# Patient Record
Sex: Male | Born: 1965 | Race: White | Hispanic: No | Smoking: Current every day smoker
Health system: Southern US, Community
[De-identification: ages and names within clinical notes are randomized; demographics above are authoritative.]

## PROBLEM LIST (undated history)

## (undated) DIAGNOSIS — G8929 Other chronic pain: Secondary | ICD-10-CM

## (undated) DIAGNOSIS — K219 Gastro-esophageal reflux disease without esophagitis: Secondary | ICD-10-CM

## (undated) DIAGNOSIS — M549 Dorsalgia, unspecified: Secondary | ICD-10-CM

## (undated) DIAGNOSIS — T7840XA Allergy, unspecified, initial encounter: Secondary | ICD-10-CM

## (undated) DIAGNOSIS — I10 Essential (primary) hypertension: Secondary | ICD-10-CM

## (undated) DIAGNOSIS — F419 Anxiety disorder, unspecified: Secondary | ICD-10-CM

## (undated) DIAGNOSIS — R339 Retention of urine, unspecified: Secondary | ICD-10-CM

## (undated) DIAGNOSIS — F1110 Opioid abuse, uncomplicated: Secondary | ICD-10-CM

## (undated) HISTORY — PX: KNEE SURGERY: SHX244

## (undated) HISTORY — DX: Gastro-esophageal reflux disease without esophagitis: K21.9

## (undated) HISTORY — DX: Allergy, unspecified, initial encounter: T78.40XA

## (undated) HISTORY — DX: Dorsalgia, unspecified: M54.9

## (undated) HISTORY — PX: BACK SURGERY: SHX140

---

## 2006-01-13 ENCOUNTER — Inpatient Hospital Stay: Payer: Self-pay | Admitting: Psychiatry

## 2006-03-25 ENCOUNTER — Other Ambulatory Visit: Payer: Self-pay

## 2006-03-25 ENCOUNTER — Emergency Department: Payer: Self-pay | Admitting: Emergency Medicine

## 2006-10-24 ENCOUNTER — Other Ambulatory Visit: Payer: Self-pay

## 2006-10-24 ENCOUNTER — Emergency Department: Payer: Self-pay | Admitting: Emergency Medicine

## 2006-12-15 ENCOUNTER — Emergency Department: Payer: Self-pay | Admitting: Unknown Physician Specialty

## 2008-07-25 ENCOUNTER — Ambulatory Visit: Payer: Self-pay | Admitting: Pain Medicine

## 2008-12-20 ENCOUNTER — Ambulatory Visit: Payer: Self-pay | Admitting: Orthopedic Surgery

## 2009-01-07 ENCOUNTER — Ambulatory Visit: Payer: Self-pay | Admitting: Orthopedic Surgery

## 2009-01-14 ENCOUNTER — Ambulatory Visit: Payer: Self-pay | Admitting: Orthopedic Surgery

## 2009-02-15 ENCOUNTER — Ambulatory Visit: Payer: Self-pay | Admitting: Internal Medicine

## 2009-02-20 ENCOUNTER — Ambulatory Visit: Payer: Self-pay | Admitting: Internal Medicine

## 2009-02-26 ENCOUNTER — Ambulatory Visit: Payer: Self-pay | Admitting: Internal Medicine

## 2009-02-27 ENCOUNTER — Ambulatory Visit: Payer: Self-pay | Admitting: Family Medicine

## 2009-03-28 ENCOUNTER — Ambulatory Visit: Payer: Self-pay | Admitting: Cardiovascular Disease

## 2009-04-22 ENCOUNTER — Ambulatory Visit: Payer: Self-pay | Admitting: Internal Medicine

## 2009-05-16 ENCOUNTER — Ambulatory Visit: Payer: Self-pay | Admitting: Unknown Physician Specialty

## 2009-05-20 ENCOUNTER — Ambulatory Visit: Payer: Self-pay | Admitting: Unknown Physician Specialty

## 2009-05-20 HISTORY — PX: OTHER SURGICAL HISTORY: SHX169

## 2009-07-31 ENCOUNTER — Emergency Department: Payer: Self-pay | Admitting: Emergency Medicine

## 2010-06-25 ENCOUNTER — Ambulatory Visit: Payer: Self-pay | Admitting: Family Medicine

## 2011-06-23 ENCOUNTER — Inpatient Hospital Stay: Payer: Self-pay | Admitting: Psychiatry

## 2011-06-23 LAB — DRUG SCREEN, URINE
Amphetamines, Ur Screen: NEGATIVE (ref ?–1000)
Barbiturates, Ur Screen: NEGATIVE (ref ?–200)
Benzodiazepine, Ur Scrn: NEGATIVE (ref ?–200)
Cocaine Metabolite,Ur ~~LOC~~: POSITIVE (ref ?–300)
MDMA (Ecstasy)Ur Screen: NEGATIVE (ref ?–500)
Methadone, Ur Screen: POSITIVE (ref ?–300)
Opiate, Ur Screen: NEGATIVE (ref ?–300)

## 2011-06-23 LAB — LIPASE, BLOOD: Lipase: 72 U/L — ABNORMAL LOW (ref 73–393)

## 2011-06-23 LAB — URINALYSIS, COMPLETE
Bacteria: NONE SEEN
Blood: NEGATIVE
Glucose,UR: NEGATIVE mg/dL (ref 0–75)
Ketone: NEGATIVE
Ph: 6 (ref 4.5–8.0)
Protein: NEGATIVE
Specific Gravity: 1.004 (ref 1.003–1.030)

## 2011-06-23 LAB — CBC
MCH: 31.3 pg (ref 26.0–34.0)
MCHC: 34 g/dL (ref 32.0–36.0)
MCV: 92 fL (ref 80–100)
RBC: 4.4 10*6/uL (ref 4.40–5.90)
RDW: 14.4 % (ref 11.5–14.5)
WBC: 8.1 10*3/uL (ref 3.8–10.6)

## 2011-06-23 LAB — COMPREHENSIVE METABOLIC PANEL
Alkaline Phosphatase: 73 U/L (ref 50–136)
BUN: 8 mg/dL (ref 7–18)
Bilirubin,Total: 0.5 mg/dL (ref 0.2–1.0)
Calcium, Total: 8.9 mg/dL (ref 8.5–10.1)
Creatinine: 0.88 mg/dL (ref 0.60–1.30)
EGFR (African American): >60
Glucose: 77 mg/dL (ref 65–99)
SGOT(AST): 17 U/L (ref 15–37)
SGPT (ALT): 15 U/L
Total Protein: 7.5 g/dL (ref 6.4–8.2)

## 2011-06-23 LAB — ETHANOL
Ethanol %: <0.003 % (ref 0.000–0.080)
Ethanol: <3 mg/dL

## 2011-06-23 LAB — ACETAMINOPHEN LEVEL: Acetaminophen: <2 ug/mL

## 2011-06-24 LAB — BEHAVIORAL MEDICINE 1 PANEL
Albumin: 3.8 g/dL (ref 3.4–5.0)
Alkaline Phosphatase: 73 U/L (ref 50–136)
Anion Gap: 8 (ref 7–16)
BUN: 10 mg/dL (ref 7–18)
Basophil %: 0.4 %
Bilirubin,Total: 0.4 mg/dL (ref 0.2–1.0)
Chloride: 105 mmol/L (ref 98–107)
Co2: 28 mmol/L (ref 21–32)
Creatinine: 0.97 mg/dL (ref 0.60–1.30)
EGFR (African American): >60
EGFR (Non-African Amer.): >60
Eosinophil #: 0.2 10*3/uL (ref 0.0–0.7)
Eosinophil %: 2.3 %
Glucose: 100 mg/dL — ABNORMAL HIGH (ref 65–99)
HGB: 14.3 g/dL (ref 13.0–18.0)
Lymphocyte #: 2.3 10*3/uL (ref 1.0–3.6)
Lymphocyte %: 32.1 %
MCH: 31 pg (ref 26.0–34.0)
MCV: 93 fL (ref 80–100)
Monocyte #: 0.5 x10 3/mm (ref 0.2–1.0)
Neutrophil %: 58.3 %
Osmolality: 280 (ref 275–301)
Platelet: 252 10*3/uL (ref 150–440)
RBC: 4.62 10*6/uL (ref 4.40–5.90)
RDW: 14.5 % (ref 11.5–14.5)
SGPT (ALT): 14 U/L
Thyroid Stimulating Horm: 1.91 u[IU]/mL
WBC: 7.2 10*3/uL (ref 3.8–10.6)

## 2011-06-27 ENCOUNTER — Emergency Department: Payer: Self-pay | Admitting: Emergency Medicine

## 2011-06-27 LAB — COMPREHENSIVE METABOLIC PANEL
Anion Gap: 11 (ref 7–16)
Bilirubin,Total: 0.7 mg/dL (ref 0.2–1.0)
Co2: 24 mmol/L (ref 21–32)
Creatinine: 0.85 mg/dL (ref 0.60–1.30)
EGFR (Non-African Amer.): >60
Osmolality: 274 (ref 275–301)
SGPT (ALT): 15 U/L
Sodium: 138 mmol/L (ref 136–145)
Total Protein: 7.8 g/dL (ref 6.4–8.2)

## 2011-06-27 LAB — CBC
MCH: 31.9 pg (ref 26.0–34.0)
MCHC: 34.7 g/dL (ref 32.0–36.0)
Platelet: 253 10*3/uL (ref 150–440)
WBC: 6.7 10*3/uL (ref 3.8–10.6)

## 2011-06-27 LAB — DRUG SCREEN, URINE
Amphetamines, Ur Screen: NEGATIVE (ref ?–1000)
Barbiturates, Ur Screen: NEGATIVE (ref ?–200)
Benzodiazepine, Ur Scrn: NEGATIVE (ref ?–200)
MDMA (Ecstasy)Ur Screen: NEGATIVE (ref ?–500)
Methadone, Ur Screen: NEGATIVE (ref ?–300)

## 2011-06-27 LAB — SALICYLATE LEVEL: Salicylates, Serum: 2.5 mg/dL

## 2011-06-27 LAB — ETHANOL: Ethanol: <3 mg/dL

## 2011-08-14 ENCOUNTER — Emergency Department: Payer: Self-pay | Admitting: Emergency Medicine

## 2011-08-14 LAB — URINALYSIS, COMPLETE
Bilirubin,UR: NEGATIVE
Glucose,UR: NEGATIVE mg/dL (ref 0–75)
Ketone: NEGATIVE
Leukocyte Esterase: NEGATIVE
Ph: 6 (ref 4.5–8.0)
Protein: NEGATIVE
RBC,UR: <1 /HPF (ref 0–5)
Specific Gravity: 1.002 (ref 1.003–1.030)
Squamous Epithelial: NONE SEEN
WBC UR: NONE SEEN /HPF (ref 0–5)

## 2011-08-14 LAB — TROPONIN I: Troponin-I: <0.02 ng/mL

## 2011-08-14 LAB — CBC
HCT: 39 % — ABNORMAL LOW (ref 40.0–52.0)
HGB: 12.9 g/dL — ABNORMAL LOW (ref 13.0–18.0)
MCH: 31.7 pg (ref 26.0–34.0)
MCV: 96 fL (ref 80–100)
RBC: 4.06 10*6/uL — ABNORMAL LOW (ref 4.40–5.90)
RDW: 14.1 % (ref 11.5–14.5)
WBC: 5.7 10*3/uL (ref 3.8–10.6)

## 2011-08-14 LAB — CK TOTAL AND CKMB (NOT AT ARMC)
CK, Total: 68 U/L (ref 35–232)
CK-MB: <0.5 ng/mL — ABNORMAL LOW (ref 0.5–3.6)

## 2011-08-14 LAB — COMPREHENSIVE METABOLIC PANEL
Albumin: 3.2 g/dL — ABNORMAL LOW (ref 3.4–5.0)
Alkaline Phosphatase: 84 U/L (ref 50–136)
Bilirubin,Total: 0.3 mg/dL (ref 0.2–1.0)
Calcium, Total: 8.3 mg/dL — ABNORMAL LOW (ref 8.5–10.1)
Chloride: 109 mmol/L — ABNORMAL HIGH (ref 98–107)
Creatinine: 0.83 mg/dL (ref 0.60–1.30)
EGFR (African American): >60
EGFR (Non-African Amer.): >60
Osmolality: 289 (ref 275–301)
Potassium: 3.3 mmol/L — ABNORMAL LOW (ref 3.5–5.1)
SGPT (ALT): 15 U/L
Sodium: 146 mmol/L — ABNORMAL HIGH (ref 136–145)

## 2011-08-15 ENCOUNTER — Emergency Department: Payer: Self-pay | Admitting: Emergency Medicine

## 2011-09-13 ENCOUNTER — Emergency Department: Payer: Self-pay | Admitting: *Deleted

## 2011-11-28 ENCOUNTER — Ambulatory Visit: Payer: Self-pay

## 2011-11-28 ENCOUNTER — Inpatient Hospital Stay: Payer: Self-pay | Admitting: Psychiatry

## 2011-11-28 LAB — URINALYSIS, COMPLETE
Bacteria: NONE SEEN
Bilirubin,UR: NEGATIVE
Blood: NEGATIVE
Ketone: NEGATIVE
Protein: NEGATIVE
Specific Gravity: 1 (ref 1.003–1.030)
Squamous Epithelial: <1
WBC UR: 1 /HPF (ref 0–5)

## 2011-11-28 LAB — DRUG SCREEN, URINE
Barbiturates, Ur Screen: NEGATIVE (ref ?–200)
Benzodiazepine, Ur Scrn: NEGATIVE (ref ?–200)
Cannabinoid 50 Ng, Ur ~~LOC~~: NEGATIVE (ref ?–50)
Cocaine Metabolite,Ur ~~LOC~~: NEGATIVE (ref ?–300)
Opiate, Ur Screen: NEGATIVE (ref ?–300)

## 2011-11-28 LAB — COMPREHENSIVE METABOLIC PANEL
Albumin: 3.9 g/dL (ref 3.4–5.0)
Alkaline Phosphatase: 117 U/L (ref 50–136)
Anion Gap: 13 (ref 7–16)
BUN: 11 mg/dL (ref 7–18)
Bilirubin,Total: 0.3 mg/dL (ref 0.2–1.0)
Calcium, Total: 8.6 mg/dL (ref 8.5–10.1)
Chloride: 109 mmol/L — ABNORMAL HIGH (ref 98–107)
EGFR (African American): >60
Glucose: 97 mg/dL (ref 65–99)
Potassium: 3.6 mmol/L (ref 3.5–5.1)
SGOT(AST): 16 U/L (ref 15–37)
Sodium: 146 mmol/L — ABNORMAL HIGH (ref 136–145)
Total Protein: 7.4 g/dL (ref 6.4–8.2)

## 2011-11-28 LAB — CBC
HCT: 42.3 % (ref 40.0–52.0)
MCH: 32.2 pg (ref 26.0–34.0)
MCHC: 34.6 g/dL (ref 32.0–36.0)
Platelet: 246 10*3/uL (ref 150–440)
RDW: 13 % (ref 11.5–14.5)
WBC: 7.3 10*3/uL (ref 3.8–10.6)

## 2011-11-28 LAB — ETHANOL: Ethanol %: <0.003 % (ref 0.000–0.080)

## 2011-11-28 LAB — TSH: Thyroid Stimulating Horm: 1.08 u[IU]/mL

## 2011-11-29 LAB — LIPID PANEL
HDL Cholesterol: 32 mg/dL — ABNORMAL LOW (ref 40–60)
Ldl Cholesterol, Calc: 44 mg/dL (ref 0–100)
VLDL Cholesterol, Calc: 25 mg/dL (ref 5–40)

## 2012-01-02 ENCOUNTER — Inpatient Hospital Stay: Payer: Self-pay | Admitting: Psychiatry

## 2012-01-02 LAB — BASIC METABOLIC PANEL
Anion Gap: 6 — ABNORMAL LOW (ref 7–16)
BUN: 10 mg/dL (ref 7–18)
Co2: 30 mmol/L (ref 21–32)
Creatinine: 0.85 mg/dL (ref 0.60–1.30)
EGFR (African American): >60
EGFR (Non-African Amer.): >60
Glucose: 84 mg/dL (ref 65–99)
Osmolality: 278 (ref 275–301)
Potassium: 3.9 mmol/L (ref 3.5–5.1)
Sodium: 140 mmol/L (ref 136–145)

## 2012-01-02 LAB — URINALYSIS, COMPLETE
Bacteria: NONE SEEN
Blood: NEGATIVE
Glucose,UR: NEGATIVE mg/dL (ref 0–75)
Leukocyte Esterase: NEGATIVE
Nitrite: NEGATIVE
Ph: 8 (ref 4.5–8.0)
RBC,UR: <1 /HPF (ref 0–5)
WBC UR: NONE SEEN /HPF (ref 0–5)

## 2012-01-02 LAB — CBC
HGB: 14.7 g/dL (ref 13.0–18.0)
MCH: 32.3 pg (ref 26.0–34.0)
MCHC: 35 g/dL (ref 32.0–36.0)
MCV: 92 fL (ref 80–100)
Platelet: 289 10*3/uL (ref 150–440)
RBC: 4.56 10*6/uL (ref 4.40–5.90)
RDW: 13.6 % (ref 11.5–14.5)

## 2012-01-02 LAB — DRUG SCREEN, URINE
Amphetamines, Ur Screen: NEGATIVE (ref ?–1000)
Benzodiazepine, Ur Scrn: NEGATIVE (ref ?–200)
Cannabinoid 50 Ng, Ur ~~LOC~~: NEGATIVE (ref ?–50)
Cocaine Metabolite,Ur ~~LOC~~: POSITIVE (ref ?–300)
MDMA (Ecstasy)Ur Screen: NEGATIVE (ref ?–500)
Methadone, Ur Screen: NEGATIVE (ref ?–300)
Opiate, Ur Screen: NEGATIVE (ref ?–300)

## 2012-01-02 LAB — ETHANOL: Ethanol %: <0.003 % (ref 0.000–0.080)

## 2012-02-05 ENCOUNTER — Emergency Department: Payer: Self-pay | Admitting: Internal Medicine

## 2012-02-05 LAB — COMPREHENSIVE METABOLIC PANEL
Alkaline Phosphatase: 121 U/L (ref 50–136)
Anion Gap: 7 (ref 7–16)
Bilirubin,Total: 0.7 mg/dL (ref 0.2–1.0)
Calcium, Total: 8.7 mg/dL (ref 8.5–10.1)
Chloride: 108 mmol/L — ABNORMAL HIGH (ref 98–107)
Co2: 23 mmol/L (ref 21–32)
Creatinine: 0.87 mg/dL (ref 0.60–1.30)
EGFR (African American): >60
EGFR (Non-African Amer.): >60
Glucose: 175 mg/dL — ABNORMAL HIGH (ref 65–99)
Osmolality: 280 (ref 275–301)
Potassium: 3.4 mmol/L — ABNORMAL LOW (ref 3.5–5.1)
SGOT(AST): 27 U/L (ref 15–37)
Sodium: 138 mmol/L (ref 136–145)
Total Protein: 7.9 g/dL (ref 6.4–8.2)

## 2012-02-05 LAB — DRUG SCREEN, URINE
Amphetamines, Ur Screen: NEGATIVE (ref ?–1000)
Benzodiazepine, Ur Scrn: POSITIVE (ref ?–200)
Cannabinoid 50 Ng, Ur ~~LOC~~: NEGATIVE (ref ?–50)
Methadone, Ur Screen: NEGATIVE (ref ?–300)
Phencyclidine (PCP) Ur S: NEGATIVE (ref ?–25)

## 2012-02-05 LAB — URINALYSIS, COMPLETE
Blood: NEGATIVE
Glucose,UR: NEGATIVE mg/dL (ref 0–75)
Leukocyte Esterase: NEGATIVE
Nitrite: NEGATIVE
Ph: 8 (ref 4.5–8.0)
Protein: 30

## 2012-02-05 LAB — ETHANOL: Ethanol %: <0.003 % (ref 0.000–0.080)

## 2012-02-05 LAB — CBC
HCT: 41.9 % (ref 40.0–52.0)
HGB: 14.5 g/dL (ref 13.0–18.0)
MCH: 30.9 pg (ref 26.0–34.0)
MCHC: 34.6 g/dL (ref 32.0–36.0)

## 2012-02-05 LAB — ACETAMINOPHEN LEVEL: Acetaminophen: <2 ug/mL

## 2012-02-05 LAB — SALICYLATE LEVEL: Salicylates, Serum: 3.1 mg/dL — ABNORMAL HIGH

## 2012-04-10 ENCOUNTER — Emergency Department: Payer: Self-pay | Admitting: Emergency Medicine

## 2012-08-03 ENCOUNTER — Inpatient Hospital Stay: Payer: Self-pay | Admitting: Surgery

## 2012-08-03 LAB — DRUG SCREEN, URINE
Benzodiazepine, Ur Scrn: NEGATIVE (ref ?–200)
Cannabinoid 50 Ng, Ur ~~LOC~~: NEGATIVE (ref ?–50)
Cocaine Metabolite,Ur ~~LOC~~: POSITIVE (ref ?–300)
MDMA (Ecstasy)Ur Screen: NEGATIVE (ref ?–500)
Opiate, Ur Screen: POSITIVE (ref ?–300)

## 2012-08-03 LAB — COMPREHENSIVE METABOLIC PANEL
Albumin: 3.7 g/dL (ref 3.4–5.0)
Alkaline Phosphatase: 88 U/L (ref 50–136)
Anion Gap: 3 — ABNORMAL LOW (ref 7–16)
Calcium, Total: 8.4 mg/dL — ABNORMAL LOW (ref 8.5–10.1)
Co2: 28 mmol/L (ref 21–32)
Creatinine: 1.23 mg/dL (ref 0.60–1.30)
EGFR (African American): >60
EGFR (Non-African Amer.): >60
Glucose: 119 mg/dL — ABNORMAL HIGH (ref 65–99)
Osmolality: 285 (ref 275–301)
Sodium: 142 mmol/L (ref 136–145)
Total Protein: 7.1 g/dL (ref 6.4–8.2)

## 2012-08-03 LAB — CBC
MCHC: 34.6 g/dL (ref 32.0–36.0)
MCV: 89 fL (ref 80–100)

## 2012-08-04 LAB — APTT: Activated PTT: 31.8 secs (ref 23.6–35.9)

## 2012-08-04 LAB — AMYLASE: Amylase: 35 U/L (ref 25–115)

## 2012-08-04 LAB — COMPREHENSIVE METABOLIC PANEL
Albumin: 3.2 g/dL — ABNORMAL LOW (ref 3.4–5.0)
Alkaline Phosphatase: 76 U/L (ref 50–136)
Co2: 26 mmol/L (ref 21–32)
Creatinine: 1.13 mg/dL (ref 0.60–1.30)
EGFR (Non-African Amer.): >60
Potassium: 3.5 mmol/L (ref 3.5–5.1)
SGOT(AST): 51 U/L — ABNORMAL HIGH (ref 15–37)
Total Protein: 6.4 g/dL (ref 6.4–8.2)

## 2012-08-04 LAB — CBC WITH DIFFERENTIAL/PLATELET
Basophil #: 0 10*3/uL (ref 0.0–0.1)
Basophil %: 0.2 %
Eosinophil #: 0.1 10*3/uL (ref 0.0–0.7)
HCT: 34.6 % — ABNORMAL LOW (ref 40.0–52.0)
Lymphocyte %: 18.2 %
MCHC: 34.5 g/dL (ref 32.0–36.0)
Monocyte #: 0.8 x10 3/mm (ref 0.2–1.0)
Monocyte %: 11.4 %
Neutrophil #: 4.8 10*3/uL (ref 1.4–6.5)
Neutrophil %: 69.1 %
Platelet: 206 10*3/uL (ref 150–440)
RDW: 13.4 % (ref 11.5–14.5)

## 2012-08-04 LAB — PROTIME-INR
INR: 1.1
Prothrombin Time: 14.8 secs — ABNORMAL HIGH (ref 11.5–14.7)

## 2012-08-05 ENCOUNTER — Ambulatory Visit: Payer: Self-pay | Admitting: Unknown Physician Specialty

## 2012-08-08 LAB — CBC WITH DIFFERENTIAL/PLATELET
Basophil #: 0 10*3/uL (ref 0.0–0.1)
Basophil %: 0.5 %
HCT: 33 % — ABNORMAL LOW (ref 40.0–52.0)
HGB: 11.4 g/dL — ABNORMAL LOW (ref 13.0–18.0)
Lymphocyte %: 13.2 %
MCH: 30.9 pg (ref 26.0–34.0)
Monocyte #: 0.9 x10 3/mm (ref 0.2–1.0)
Neutrophil #: 4.1 10*3/uL (ref 1.4–6.5)
Neutrophil %: 67.5 %
Platelet: 253 10*3/uL (ref 150–440)

## 2012-08-10 LAB — CBC WITH DIFFERENTIAL/PLATELET
Eosinophil #: 0.3 10*3/uL (ref 0.0–0.7)
Eosinophil %: 5.8 %
HGB: 10.3 g/dL — ABNORMAL LOW (ref 13.0–18.0)
Lymphocyte %: 16.1 %
MCH: 30.9 pg (ref 26.0–34.0)
MCHC: 35.3 g/dL (ref 32.0–36.0)
MCV: 88 fL (ref 80–100)
Neutrophil #: 3.1 10*3/uL (ref 1.4–6.5)
Neutrophil %: 65.9 %
RBC: 3.32 10*6/uL — ABNORMAL LOW (ref 4.40–5.90)
WBC: 4.7 10*3/uL (ref 3.8–10.6)

## 2012-08-13 ENCOUNTER — Emergency Department: Payer: Self-pay | Admitting: Emergency Medicine

## 2012-08-13 LAB — TSH: Thyroid Stimulating Horm: 0.496 u[IU]/mL

## 2012-08-13 LAB — COMPREHENSIVE METABOLIC PANEL
Albumin: 3.1 g/dL — ABNORMAL LOW (ref 3.4–5.0)
Alkaline Phosphatase: 123 U/L (ref 50–136)
Anion Gap: 7 (ref 7–16)
Bilirubin,Total: 0.8 mg/dL (ref 0.2–1.0)
Chloride: 103 mmol/L (ref 98–107)
EGFR (African American): >60
EGFR (Non-African Amer.): >60
Glucose: 103 mg/dL — ABNORMAL HIGH (ref 65–99)
SGOT(AST): 35 U/L (ref 15–37)
SGPT (ALT): 40 U/L (ref 12–78)
Sodium: 138 mmol/L (ref 136–145)
Total Protein: 7 g/dL (ref 6.4–8.2)

## 2012-08-13 LAB — ETHANOL
Ethanol %: 0.003 % (ref 0.000–0.080)
Ethanol: 3 mg/dL

## 2012-08-13 LAB — ACETAMINOPHEN LEVEL: Acetaminophen: <2 ug/mL

## 2012-08-13 LAB — CBC
MCH: 30.1 pg (ref 26.0–34.0)
MCV: 89 fL (ref 80–100)
RDW: 13.6 % (ref 11.5–14.5)
WBC: 8.8 10*3/uL (ref 3.8–10.6)

## 2012-08-13 LAB — SALICYLATE LEVEL: Salicylates, Serum: <1.7 mg/dL

## 2012-08-14 LAB — DRUG SCREEN, URINE
Barbiturates, Ur Screen: NEGATIVE (ref ?–200)
Benzodiazepine, Ur Scrn: NEGATIVE (ref ?–200)
Cannabinoid 50 Ng, Ur ~~LOC~~: NEGATIVE (ref ?–50)
MDMA (Ecstasy)Ur Screen: NEGATIVE (ref ?–500)
Methadone, Ur Screen: NEGATIVE (ref ?–300)
Tricyclic, Ur Screen: NEGATIVE (ref ?–1000)

## 2012-08-14 LAB — URINALYSIS, COMPLETE
Bilirubin,UR: NEGATIVE
Glucose,UR: NEGATIVE mg/dL (ref 0–75)
Leukocyte Esterase: NEGATIVE
Nitrite: NEGATIVE
Protein: NEGATIVE
RBC,UR: <1 /HPF (ref 0–5)
Specific Gravity: 1.013 (ref 1.003–1.030)

## 2012-10-13 ENCOUNTER — Emergency Department: Payer: Self-pay | Admitting: Emergency Medicine

## 2012-10-13 LAB — URINALYSIS, COMPLETE
Bilirubin,UR: NEGATIVE
Blood: NEGATIVE
Glucose,UR: NEGATIVE mg/dL (ref 0–75)
Hyaline Cast: 2
Leukocyte Esterase: NEGATIVE
Protein: 30
RBC,UR: <1 /HPF (ref 0–5)
Specific Gravity: 1.032 (ref 1.003–1.030)
Squamous Epithelial: <1

## 2012-10-13 LAB — CBC
HCT: 43.8 % (ref 40.0–52.0)
MCV: 88 fL (ref 80–100)
Platelet: 290 10*3/uL (ref 150–440)
RBC: 4.97 10*6/uL (ref 4.40–5.90)
RDW: 13.9 % (ref 11.5–14.5)
WBC: 6.3 10*3/uL (ref 3.8–10.6)

## 2012-10-13 LAB — COMPREHENSIVE METABOLIC PANEL
Alkaline Phosphatase: 154 U/L — ABNORMAL HIGH (ref 50–136)
Anion Gap: 2 — ABNORMAL LOW (ref 7–16)
BUN: 10 mg/dL (ref 7–18)
Bilirubin,Total: 0.3 mg/dL (ref 0.2–1.0)
Calcium, Total: 8.9 mg/dL (ref 8.5–10.1)
Co2: 31 mmol/L (ref 21–32)
EGFR (African American): >60
EGFR (Non-African Amer.): >60
Glucose: 88 mg/dL (ref 65–99)
Potassium: 4 mmol/L (ref 3.5–5.1)
SGOT(AST): 22 U/L (ref 15–37)
SGPT (ALT): 24 U/L (ref 12–78)

## 2012-10-13 LAB — ETHANOL
Ethanol %: <0.003 % (ref 0.000–0.080)
Ethanol: <3 mg/dL

## 2012-10-13 LAB — DRUG SCREEN, URINE
Amphetamines, Ur Screen: NEGATIVE (ref ?–1000)
Benzodiazepine, Ur Scrn: POSITIVE (ref ?–200)
Cocaine Metabolite,Ur ~~LOC~~: NEGATIVE (ref ?–300)
MDMA (Ecstasy)Ur Screen: NEGATIVE (ref ?–500)
Methadone, Ur Screen: NEGATIVE (ref ?–300)
Phencyclidine (PCP) Ur S: NEGATIVE (ref ?–25)
Tricyclic, Ur Screen: NEGATIVE (ref ?–1000)

## 2013-01-17 ENCOUNTER — Emergency Department: Payer: Self-pay | Admitting: Emergency Medicine

## 2013-01-17 LAB — CBC
HCT: 39.3 % — ABNORMAL LOW (ref 40.0–52.0)
HGB: 13.5 g/dL (ref 13.0–18.0)
MCH: 30.8 pg (ref 26.0–34.0)
MCHC: 34.4 g/dL (ref 32.0–36.0)
RBC: 4.38 10*6/uL — ABNORMAL LOW (ref 4.40–5.90)
RDW: 15.1 % — ABNORMAL HIGH (ref 11.5–14.5)
WBC: 5 10*3/uL (ref 3.8–10.6)

## 2013-01-17 LAB — BASIC METABOLIC PANEL
Anion Gap: 3 — ABNORMAL LOW (ref 7–16)
BUN: 13 mg/dL (ref 7–18)
Calcium, Total: 8.8 mg/dL (ref 8.5–10.1)
Co2: 29 mmol/L (ref 21–32)
Creatinine: 1.05 mg/dL (ref 0.60–1.30)
EGFR (Non-African Amer.): >60
Osmolality: 277 (ref 275–301)
Sodium: 139 mmol/L (ref 136–145)

## 2013-01-31 ENCOUNTER — Emergency Department: Payer: Self-pay | Admitting: Emergency Medicine

## 2013-02-11 ENCOUNTER — Emergency Department: Payer: Self-pay | Admitting: Emergency Medicine

## 2013-02-21 ENCOUNTER — Emergency Department: Payer: Self-pay | Admitting: Emergency Medicine

## 2013-02-26 ENCOUNTER — Emergency Department: Payer: Self-pay | Admitting: Emergency Medicine

## 2013-02-26 LAB — CBC
MCH: 30.5 pg (ref 26.0–34.0)
MCHC: 33.6 g/dL (ref 32.0–36.0)
MCV: 91 fL (ref 80–100)
WBC: 8.1 10*3/uL (ref 3.8–10.6)

## 2013-02-26 LAB — URINALYSIS, COMPLETE
Bacteria: NONE SEEN
Bilirubin,UR: NEGATIVE
Blood: NEGATIVE
Glucose,UR: NEGATIVE mg/dL (ref 0–75)
Ketone: NEGATIVE
Ph: 7 (ref 4.5–8.0)
Protein: NEGATIVE
RBC,UR: <1 /HPF (ref 0–5)

## 2013-02-26 LAB — BASIC METABOLIC PANEL
Anion Gap: 4 — ABNORMAL LOW (ref 7–16)
BUN: 9 mg/dL (ref 7–18)
Calcium, Total: 9.2 mg/dL (ref 8.5–10.1)
Co2: 28 mmol/L (ref 21–32)
Creatinine: 0.89 mg/dL (ref 0.60–1.30)
EGFR (Non-African Amer.): >60
Glucose: 106 mg/dL — ABNORMAL HIGH (ref 65–99)
Osmolality: 269 (ref 275–301)
Potassium: 3.5 mmol/L (ref 3.5–5.1)
Sodium: 135 mmol/L — ABNORMAL LOW (ref 136–145)

## 2013-03-02 ENCOUNTER — Emergency Department: Payer: Self-pay | Admitting: Emergency Medicine

## 2013-03-02 LAB — CBC
HCT: 40 % (ref 40.0–52.0)
HGB: 13.7 g/dL (ref 13.0–18.0)
MCH: 30.7 pg (ref 26.0–34.0)
MCHC: 34.2 g/dL (ref 32.0–36.0)
MCV: 90 fL (ref 80–100)
Platelet: 230 10*3/uL (ref 150–440)
RBC: 4.45 10*6/uL (ref 4.40–5.90)
RDW: 13.4 % (ref 11.5–14.5)
WBC: 5.8 10*3/uL (ref 3.8–10.6)

## 2013-03-02 LAB — BASIC METABOLIC PANEL
Anion Gap: 4 — ABNORMAL LOW (ref 7–16)
BUN: 14 mg/dL (ref 7–18)
Calcium, Total: 8.8 mg/dL (ref 8.5–10.1)
Chloride: 106 mmol/L (ref 98–107)
Co2: 27 mmol/L (ref 21–32)
Creatinine: 1.11 mg/dL (ref 0.60–1.30)
EGFR (African American): >60
EGFR (Non-African Amer.): >60
Glucose: 161 mg/dL — ABNORMAL HIGH (ref 65–99)
Osmolality: 278 (ref 275–301)
Potassium: 3.4 mmol/L — ABNORMAL LOW (ref 3.5–5.1)
SODIUM: 137 mmol/L (ref 136–145)

## 2013-03-15 ENCOUNTER — Ambulatory Visit: Payer: Self-pay | Admitting: Anesthesiology

## 2013-03-15 ENCOUNTER — Emergency Department: Payer: Self-pay | Admitting: Emergency Medicine

## 2013-03-15 LAB — COMPREHENSIVE METABOLIC PANEL
ALK PHOS: 104 U/L
ALT: 22 U/L (ref 12–78)
AST: 20 U/L (ref 15–37)
Albumin: 3.7 g/dL (ref 3.4–5.0)
Anion Gap: 5 — ABNORMAL LOW (ref 7–16)
BUN: 10 mg/dL (ref 7–18)
Bilirubin,Total: 0.4 mg/dL (ref 0.2–1.0)
Calcium, Total: 9 mg/dL (ref 8.5–10.1)
Chloride: 108 mmol/L — ABNORMAL HIGH (ref 98–107)
Co2: 26 mmol/L (ref 21–32)
Creatinine: 0.79 mg/dL (ref 0.60–1.30)
GLUCOSE: 94 mg/dL (ref 65–99)
OSMOLALITY: 276 (ref 275–301)
POTASSIUM: 3.7 mmol/L (ref 3.5–5.1)
Sodium: 139 mmol/L (ref 136–145)
Total Protein: 7.1 g/dL (ref 6.4–8.2)

## 2013-03-15 LAB — CBC
HCT: 42 % (ref 40.0–52.0)
HGB: 14.5 g/dL (ref 13.0–18.0)
MCH: 31.1 pg (ref 26.0–34.0)
MCHC: 34.5 g/dL (ref 32.0–36.0)
MCV: 90 fL (ref 80–100)
Platelet: 251 10*3/uL (ref 150–440)
RBC: 4.65 10*6/uL (ref 4.40–5.90)
RDW: 13.9 % (ref 11.5–14.5)
WBC: 6.6 10*3/uL (ref 3.8–10.6)

## 2013-03-15 LAB — URINALYSIS, COMPLETE
Bilirubin,UR: NEGATIVE
Blood: NEGATIVE
Glucose,UR: NEGATIVE mg/dL (ref 0–75)
KETONE: NEGATIVE
LEUKOCYTE ESTERASE: NEGATIVE
Nitrite: NEGATIVE
PH: 6 (ref 4.5–8.0)
PROTEIN: NEGATIVE
RBC,UR: 2 /HPF (ref 0–5)
SPECIFIC GRAVITY: 1.015 (ref 1.003–1.030)
Squamous Epithelial: 1
WBC UR: 3 /HPF (ref 0–5)

## 2013-03-18 ENCOUNTER — Emergency Department: Payer: Self-pay | Admitting: Emergency Medicine

## 2013-03-19 ENCOUNTER — Ambulatory Visit: Payer: Self-pay | Admitting: Urology

## 2013-03-22 LAB — PATHOLOGY REPORT

## 2013-08-11 ENCOUNTER — Emergency Department: Payer: Self-pay | Admitting: Emergency Medicine

## 2013-08-11 LAB — URINALYSIS, COMPLETE
BACTERIA: NONE SEEN
BILIRUBIN, UR: NEGATIVE
BLOOD: NEGATIVE
Ketone: NEGATIVE
Leukocyte Esterase: NEGATIVE
Nitrite: NEGATIVE
PH: 5 (ref 4.5–8.0)
Protein: NEGATIVE
RBC,UR: 1 /HPF (ref 0–5)
SPECIFIC GRAVITY: 1.028 (ref 1.003–1.030)
Squamous Epithelial: NONE SEEN
WBC UR: NONE SEEN /HPF (ref 0–5)

## 2013-08-11 LAB — COMPREHENSIVE METABOLIC PANEL
ALK PHOS: 76 U/L
Albumin: 4.1 g/dL (ref 3.4–5.0)
Anion Gap: 7 (ref 7–16)
BUN: 13 mg/dL (ref 7–18)
Bilirubin,Total: 0.7 mg/dL (ref 0.2–1.0)
CALCIUM: 8.9 mg/dL (ref 8.5–10.1)
Chloride: 106 mmol/L (ref 98–107)
Co2: 26 mmol/L (ref 21–32)
Creatinine: 1.07 mg/dL (ref 0.60–1.30)
Glucose: 120 mg/dL — ABNORMAL HIGH (ref 65–99)
Osmolality: 279 (ref 275–301)
Potassium: 3.3 mmol/L — ABNORMAL LOW (ref 3.5–5.1)
SGOT(AST): 28 U/L (ref 15–37)
SGPT (ALT): 37 U/L (ref 12–78)
SODIUM: 139 mmol/L (ref 136–145)
TOTAL PROTEIN: 7.8 g/dL (ref 6.4–8.2)

## 2013-08-11 LAB — CBC WITH DIFFERENTIAL/PLATELET
BASOS PCT: 0.5 %
Basophil #: 0.1 10*3/uL (ref 0.0–0.1)
Eosinophil #: 0 10*3/uL (ref 0.0–0.7)
Eosinophil %: 0.2 %
HCT: 40.5 % (ref 40.0–52.0)
HGB: 13.8 g/dL (ref 13.0–18.0)
LYMPHS ABS: 1.8 10*3/uL (ref 1.0–3.6)
LYMPHS PCT: 19 %
MCH: 31.2 pg (ref 26.0–34.0)
MCHC: 34.1 g/dL (ref 32.0–36.0)
MCV: 92 fL (ref 80–100)
MONOS PCT: 8.6 %
Monocyte #: 0.8 x10 3/mm (ref 0.2–1.0)
NEUTROS ABS: 6.6 10*3/uL — AB (ref 1.4–6.5)
Neutrophil %: 71.7 %
PLATELETS: 247 10*3/uL (ref 150–440)
RBC: 4.43 10*6/uL (ref 4.40–5.90)
RDW: 14 % (ref 11.5–14.5)
WBC: 9.2 10*3/uL (ref 3.8–10.6)

## 2013-08-11 LAB — LIPASE, BLOOD: Lipase: 203 U/L (ref 73–393)

## 2013-08-11 LAB — TROPONIN I

## 2013-08-27 ENCOUNTER — Emergency Department: Payer: Self-pay | Admitting: Emergency Medicine

## 2013-08-27 LAB — COMPREHENSIVE METABOLIC PANEL
ALK PHOS: 85 U/L
Albumin: 3.7 g/dL (ref 3.4–5.0)
Anion Gap: 5 — ABNORMAL LOW (ref 7–16)
BUN: 11 mg/dL (ref 7–18)
Bilirubin,Total: 0.2 mg/dL (ref 0.2–1.0)
CO2: 26 mmol/L (ref 21–32)
Calcium, Total: 8.5 mg/dL (ref 8.5–10.1)
Chloride: 107 mmol/L (ref 98–107)
Creatinine: 1.04 mg/dL (ref 0.60–1.30)
EGFR (African American): >60
GLUCOSE: 93 mg/dL (ref 65–99)
Osmolality: 275 (ref 275–301)
Potassium: 4 mmol/L (ref 3.5–5.1)
SGOT(AST): 31 U/L (ref 15–37)
SGPT (ALT): 38 U/L (ref 12–78)
Sodium: 138 mmol/L (ref 136–145)
Total Protein: 7.6 g/dL (ref 6.4–8.2)

## 2013-08-27 LAB — URINALYSIS, COMPLETE
BLOOD: NEGATIVE
Bacteria: NONE SEEN
Bilirubin,UR: NEGATIVE
GLUCOSE, UR: NEGATIVE mg/dL (ref 0–75)
Ketone: NEGATIVE
Leukocyte Esterase: NEGATIVE
Nitrite: NEGATIVE
PROTEIN: NEGATIVE
Ph: 7 (ref 4.5–8.0)
RBC,UR: <1 /HPF (ref 0–5)
Specific Gravity: 1.012 (ref 1.003–1.030)
Squamous Epithelial: <1
WBC UR: NONE SEEN /HPF (ref 0–5)

## 2013-08-27 LAB — CBC WITH DIFFERENTIAL/PLATELET
BASOS PCT: 1 %
Basophil #: 0.1 10*3/uL (ref 0.0–0.1)
Eosinophil #: 0.1 10*3/uL (ref 0.0–0.7)
Eosinophil %: 2.5 %
HCT: 40.7 % (ref 40.0–52.0)
HGB: 13.7 g/dL (ref 13.0–18.0)
LYMPHS ABS: 1.5 10*3/uL (ref 1.0–3.6)
LYMPHS PCT: 28.3 %
MCH: 30.7 pg (ref 26.0–34.0)
MCHC: 33.7 g/dL (ref 32.0–36.0)
MCV: 91 fL (ref 80–100)
MONO ABS: 0.4 x10 3/mm (ref 0.2–1.0)
Monocyte %: 7.4 %
Neutrophil #: 3.2 10*3/uL (ref 1.4–6.5)
Neutrophil %: 60.8 %
Platelet: 238 10*3/uL (ref 150–440)
RBC: 4.46 10*6/uL (ref 4.40–5.90)
RDW: 13.5 % (ref 11.5–14.5)
WBC: 5.2 10*3/uL (ref 3.8–10.6)

## 2013-08-30 ENCOUNTER — Emergency Department: Payer: Self-pay | Admitting: Emergency Medicine

## 2013-08-30 LAB — CBC WITH DIFFERENTIAL/PLATELET
BASOS ABS: 0.1 10*3/uL (ref 0.0–0.1)
Basophil %: 0.6 %
Eosinophil #: 0 10*3/uL (ref 0.0–0.7)
Eosinophil %: 0.3 %
HCT: 40.5 % (ref 40.0–52.0)
HGB: 13.3 g/dL (ref 13.0–18.0)
LYMPHS PCT: 15.3 %
Lymphocyte #: 1.5 10*3/uL (ref 1.0–3.6)
MCH: 30.2 pg (ref 26.0–34.0)
MCHC: 32.9 g/dL (ref 32.0–36.0)
MCV: 92 fL (ref 80–100)
Monocyte #: 0.6 x10 3/mm (ref 0.2–1.0)
Monocyte %: 6.5 %
NEUTROS PCT: 77.3 %
Neutrophil #: 7.6 10*3/uL — ABNORMAL HIGH (ref 1.4–6.5)
Platelet: 257 10*3/uL (ref 150–440)
RBC: 4.41 10*6/uL (ref 4.40–5.90)
RDW: 13.6 % (ref 11.5–14.5)
WBC: 9.9 10*3/uL (ref 3.8–10.6)

## 2013-08-30 LAB — COMPREHENSIVE METABOLIC PANEL
ALBUMIN: 3.9 g/dL (ref 3.4–5.0)
ALK PHOS: 77 U/L
ALT: 37 U/L (ref 12–78)
Anion Gap: 10 (ref 7–16)
BILIRUBIN TOTAL: 0.5 mg/dL (ref 0.2–1.0)
BUN: 10 mg/dL (ref 7–18)
CREATININE: 0.97 mg/dL (ref 0.60–1.30)
Calcium, Total: 8.8 mg/dL (ref 8.5–10.1)
Chloride: 105 mmol/L (ref 98–107)
Co2: 25 mmol/L (ref 21–32)
EGFR (African American): >60
EGFR (Non-African Amer.): >60
Glucose: 95 mg/dL (ref 65–99)
Osmolality: 278 (ref 275–301)
Potassium: 3.8 mmol/L (ref 3.5–5.1)
SGOT(AST): 43 U/L — ABNORMAL HIGH (ref 15–37)
SODIUM: 140 mmol/L (ref 136–145)
Total Protein: 7.8 g/dL (ref 6.4–8.2)

## 2013-08-30 LAB — LIPASE, BLOOD: Lipase: 103 U/L (ref 73–393)

## 2013-12-10 ENCOUNTER — Emergency Department: Payer: Self-pay | Admitting: Emergency Medicine

## 2014-04-18 ENCOUNTER — Ambulatory Visit: Payer: Self-pay | Admitting: Gastroenterology

## 2014-04-18 LAB — HM COLONOSCOPY

## 2014-04-24 ENCOUNTER — Other Ambulatory Visit (HOSPITAL_COMMUNITY): Payer: Self-pay | Admitting: Neurosurgery

## 2014-04-24 DIAGNOSIS — M5416 Radiculopathy, lumbar region: Secondary | ICD-10-CM

## 2014-05-08 ENCOUNTER — Ambulatory Visit (HOSPITAL_COMMUNITY)
Admission: RE | Admit: 2014-05-08 | Discharge: 2014-05-08 | Disposition: A | Discharge status: Home / Self Care | Payer: Medicaid Other | Source: Ambulatory Visit | Attending: Neurosurgery | Admitting: Neurosurgery

## 2014-05-08 DIAGNOSIS — R200 Anesthesia of skin: Secondary | ICD-10-CM | POA: Diagnosis not present

## 2014-05-08 DIAGNOSIS — M5416 Radiculopathy, lumbar region: Secondary | ICD-10-CM

## 2014-05-08 MED ORDER — OXYCODONE HCL 5 MG PO TABS
5.0000 mg | ORAL_TABLET | ORAL | Status: DC | PRN
  Administered 2014-05-08: 10 mg via ORAL

## 2014-05-08 MED ORDER — OXYCODONE HCL 5 MG PO TABS
ORAL_TABLET | ORAL | Status: AC
  Filled 2014-05-08: qty 2

## 2014-05-08 MED ORDER — KETOROLAC TROMETHAMINE 30 MG/ML IJ SOLN
30.0000 mg | Freq: Once | INTRAMUSCULAR | Status: AC
  Administered 2014-05-08: 30 mg via INTRAMUSCULAR
  Filled 2014-05-08 (×2): qty 1

## 2014-05-08 MED ORDER — ONDANSETRON HCL 4 MG/2ML IJ SOLN: 4.0000 mg | Freq: Four times a day (QID) | INTRAMUSCULAR | Status: DC | PRN

## 2014-05-08 MED ORDER — DIAZEPAM 5 MG PO TABS
10.0000 mg | ORAL_TABLET | Freq: Once | ORAL | Status: AC
  Administered 2014-05-08: 10 mg via ORAL

## 2014-05-08 MED ORDER — DIAZEPAM 5 MG PO TABS
ORAL_TABLET | ORAL | Status: AC
Start: 2014-05-08 — End: 2014-05-08
  Filled 2014-05-08: qty 2

## 2014-05-08 MED ORDER — IOHEXOL 180 MG/ML  SOLN
20.0000 mL | Freq: Once | INTRAMUSCULAR | Status: AC | PRN
  Administered 2014-05-08: 20 mL via INTRATHECAL

## 2014-05-08 NOTE — Discharge Instructions (Signed)
Myelogram and Lumbar Puncture Discharge Instructions ° °1. Go home and rest quietly for the next 24 hours.  It is important to lie flat for the next 24 hours.  Get up only to go to the restroom.  You may lie in the bed or on a couch on your back, your stomach, your left side or your right side.  You may have one pillow under your head.  You may have pillows between your knees while you are on your side or under your knees while you are on your back. ° °2. DO NOT drive today.  Recline the seat as far back as it will go, while still wearing your seat belt, on the way home. ° °3. You may get up to go to the bathroom as needed.  You may sit up for 10 minutes to eat.  You may resume your normal diet and medications unless otherwise indicated. ° °4. The incidence of headache, nausea, or vomiting is about 5% (one in 20 patients).  If you develop a headache, lie flat and drink plenty of fluids until the headache goes away.  Caffeinated beverages may be helpful.  If you develop severe nausea and vomiting or a headache that does not go away with flat bed rest, call Dr Cabbell. ° °5. You may resume normal activities after your 24 hours of bed rest is over; however, do not exert yourself strongly or do any heavy lifting tomorrow. ° °6. Call your physician for a follow-up appointment.  The results of your myelogram will be sent directly to your physician by the following day. ° °7. If you have any questions or if complications develop after you arrive home, please call Dr Cabbell. ° °Discharge instructions have been explained to the patient.  The patient, or the person responsible for the patient, fully understands these instructions. ° ° °

## 2014-05-08 NOTE — Progress Notes (Signed)
Dr Franky Machoabbell notified of cont c/o pain and order noted

## 2014-05-08 NOTE — Op Note (Signed)
05/08/2014 Lumbar Myelogram  PATIENT:  Daniel [name] is a 49 y.o. male  PRE-OPERATIVE DIAGNOSIS:  Lumbago, right lower extremity pain  POST-OPERATIVE DIAGNOSIS:  Lumbago, right lower extremity pain  PROCEDURE:  Lumbar Myelogram  SURGEON:  Kaelen Caughlin  ANESTHESIA:   local LOCAL MEDICATIONS USED:  LIDOCAINE  Procedure Note: Daniel [name] is a 49 y.o. male Was taken to the fluoroscopy suite and  positioned prone on the fluoroscopy table. His back was prepared and draped in a sterile manner. I infiltrated 8 cc into the lumbar region. I then introduced a spinal needle into the thecal sac at the L3/4 interlaminar space. I infiltrated 20cc of Omnipaque 180 into the thecal sac. Fluoroscopy showed the needle and contrast in the thecal sac. Daniel [name] tolerated the procedure well. he Will be taken to CT for evaluation.     PATIENT DISPOSITION:  Short Stay

## 2014-06-18 NOTE — Consult Note (Signed)
Brief Consult Note: Diagnosis: Mood disorder NOS, Cocaine dependence.   Patient was seen by consultant.   Consult note dictated.   Recommend further assessment or treatment.   Orders entered.   Comments: [name] has a h/o mood instability and substance abuse. He was hospitalized at [facility] multiple times in the context of narcotics and benzodiazepine abuse. He no longer will be prescribed pain medications by Dr. Ellsworth Lennoxejan-sie. He needs opioid detox and substance abuse treatment.   PLAN: 1. The patient was referred to [facility] for residential rehab.   2. Will d/c pain medications and start symptomatic treatment for symptoms of withdrawal.   3. No benzodiazepines will be offered beyond brief ativan taper.   4. We will increase Thorazine to 50 mg three times a day plus 100 mg at night.   5. He is to continue all other medications as prescribed at the time of his last discharge.   6. I will follow up..  Electronic Signatures: Kristine LineaPucilowska, Chardonnay Holzmann (MD)  (Signed 09-Dec-13 18:31)  Authored: Brief Consult Note   Last Updated: 09-Dec-13 18:31 by Kristine LineaPucilowska, Hatcher Froning (MD)
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
## 2014-06-18 NOTE — Discharge Summary (Signed)
PATIENT NAME:  Daniel [name], Daniel [name] MR#:  [id number] DATE OF BIRTH:  1965/04/07  DATE OF ADMISSION:  11/28/2011 DATE OF DISCHARGE:  12/06/2011  HISTORY AND PHYSICAL: See dictated History and Physical for details of admission.   HOSPITAL COURSE: This 49 year old man with a history of depression and substance abuse came to the Emergency Room reporting that he was having depression and anxiety, auditory hallucinations. He was having panicky symptoms. He says he has been compliant with all of his psychiatric medicine. He was feeling stressed out because his wife had left him earlier in the year. At the time of admission his drug screen was negative, and he denied that he had been abusing drugs. He did have several medical complaints. He was admitted to the hospital for treatment of depression. In the hospital, he has been treated with antidepressants as well as antipsychotic medications. He has been engaged in groups and activities. For the most part, he has participated reasonably appropriately. There have been concerns that have worsened during the course of his hospital stay that he may be cheeking medication and persistently violating some ward routine rules by going into other patient's rooms. He has complained throughout his hospital stay that he is hearing voices all the time. He at no time looks like he is responding to internal stimuli or hallucinating. He has always presented as sane, lucid, and with an appropriate euthymic affect and appropriate engagement. He tends to remain primarily focused on his medical issues. These have included complaint of an enlarged testicle which was diagnosed as a hydrocele, complaint of chronic pain in his right foot which was diagnosed as having a metatarsal fracture that has still not healed, and complaint of chronic sleep and pain problems. At the time of discharge, he is not reporting acute suicidal ideation. He has not engaged in any dangerous or threatening or suicidal  behavior in the hospital. He is completely willing to follow up with appropriate outpatient treatment in the community. He reports that he is finally sleeping better. He does report that he is still hearing voices but feels like he can cope with them back at home. He will not be given a prescription for his narcotic pain medicine at discharge.   DISCHARGE MEDICATIONS:  1. Thorazine 50 mg at bedtime.  2. Quetiapine 100 mg at bedtime.  3. Seroquel 50 mg t.i.d.  4. Atarax every 4 hours p.r.n. for anxiety.  5. Percocet 7.5 mg strength q.i.d.  No prescription given.  6. Trazodone 200 mg at bedtime.  7. Prilosec 40 mg per day.  8. Remeron 15 mg at night.  9. Gabapentin 600 mg q.i.d.  10. Lexapro 20 mg per day.  11. Colace 100 mg b.i.d.  12. Flexeril 10 mg t.i.d. 13. Hygroton 25 mg per day.  14. Celebrex 200 mg per day.   LABORATORY, DIAGNOSTIC AND RADIOLOGICAL DATA:  Right foot x-ray showed that he still has a fracture at the base of the second and third metatarsal. Lipid panel shows a cholesterol low at 101, triglycerides low at 123, HDL low at 32, LDL low at 44. Admission labs show a drug screen negative. TSH normal. Alcohol level undetectable. Chemistry panel notable for slightly high sodium at 146, slightly high chloride at 109, CBC normal.   DISPOSITION: Discharge back home.   FOLLOWUP: Follow up with Mental Health treatment in the community as done previously. He will follow up with CBC. He also knows that he needs to contact his medical doctor for follow-up medication  treatment, especially pain medicine and follow-up for treatment of his broken foot if he wants to.   MENTAL STATUS EXAM: Casually dressed, reasonably well groomed. Good eye contact. Normal psychomotor activity. Speech is normal in rate, tone, and volume. Affect is euthymic, reactive and appropriate. Mood is stated as being anxious. Thoughts are lucid with no evidence of loosening of associations or delusions. No evidence of  paranoia. He denies suicidal or homicidal ideation. He does endorse having auditory hallucinations constantly of his recently deceased friend but does not appear to be responding to internal stimuli. He shows improved judgment and insight. Normal intelligence, normal fund of knowledge. Normal short-term memory.   DIAGNOSIS, PRINCIPAL AND PRIMARY:  AXIS I: Mood disorder, not otherwise specified.   SECONDARY DIAGNOSES:  AXIS I: Cocaine dependence, in remission.   AXIS II: Rule out cluster B personality disorder.   AXIS III:  1. High blood pressure. 2. Pain in the foot.  3. Hydrocele.  4. Gastric reflux symptoms.  5. 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   AXIS IV: Moderate-to-severe  from lack of socialization and support.   AXIS V: Functioning at time of discharge 53.   ____________________________ Audery Amel, MD jtc:cbb D: 12/06/2011 10:48:31 ET T: 12/06/2011 11:01:45 ET JOB#: [id number]  cc: Audery Amel, MD, <Dictator> Audery Amel MD ELECTRONICALLY SIGNED 12/06/2011 11:40

## 2014-06-18 NOTE — Consult Note (Signed)
PATIENT NAME:  Daniel [name], [name] MR#:  [id number] DATE OF BIRTH:  28-Jan-1966  DATE OF CONSULTATION:  12/03/2011  REFERRING PHYSICIAN:   CONSULTING PHYSICIAN:  Leitha SchullerMichael J. Salli Bodin, MD  HISTORY: Patient is a 49 year old who suffered a fracture of his foot several months ago. He had been treated by Dr. Ether GriffinsFowler. Patient reports that he had disagreements with him and is no longer seeing him. Initially was casted and then a half splint. I am not certain if this was a plastic orthosis or just fiberglass splint with Ace wrap. He was unable to maintain nonweightbearing status. He has been having some persistent pain and was consulted for evaluation because of recent increase in pain and swelling. Patient reports persistent foot pain with some persistent swelling to the right foot. On exam he does have some edema to the foot and his x-rays from 12/02/2011 were reviewed. They show a proximal second and third metatarsal fracture that have some evidence of fracture callus but not complete union. On exam he is slightly tender.   CLINICAL IMPRESSION: Delayed union, right proximal second and third metatarsal.   RECOMMENDATIONS: Follow up as an outpatient to obtain bone stimulator. Expect healing over the next 2 to 3 months radiographically as well as clinically. He is weight-bearing as tolerated at this time.   ____________________________ Leitha SchullerMichael J. Dream Harman, MD mjm:cms D: 12/05/2011 12:15:00 ET T: 12/05/2011 12:56:58 ET JOB#: [id number]  cc: Leitha SchullerMichael J. Leonardo Makris, MD, <Dictator>  Leitha SchullerMICHAEL J Deston Bilyeu MD ELECTRONICALLY SIGNED 12/06/2011 12:33

## 2014-06-18 NOTE — Consult Note (Signed)
Brief Consult Note: Diagnosis: delayed union, multiple metatarsal fractures.   Patient was seen by consultant.   Comments: will need follow up appointment to arrange for bone stimulator.  Electronic Signatures: Leitha SchullerMenz, Marvin Maenza J (MD)  (Signed 04-Oct-13 17:41)  Authored: Brief Consult Note   Last Updated: 04-Oct-13 17:41 by Leitha SchullerMenz, Nicholi Ghuman J (MD)
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
## 2014-06-18 NOTE — Consult Note (Signed)
PATIENT NAME:  Daniel [name], Daniel [name] DATE OF BIRTH:  07-12-1965  DATE OF CONSULTATION:  01/05/2012  REFERRING PHYSICIAN:  Dr. Jennet MaduroPucilowska  CONSULTING PHYSICIAN:  Eri Platten C. River Ambrosio, MD  REASON FOR CONSULTATION: Hydrocele.   HISTORY OF PRESENT ILLNESS: 49 year old white male admitted to [facility] for depression/suicidal ideation. He has history of right hydrocele. States over the past several weeks it has enlarged considerably in size. He complains of right testicular discomfort. He had an ultrasound performed in the Emergency Department on 11/03. He denies any previous urological evaluation. He has no voiding complaints.   PAST MEDICAL HISTORY:  1. Right foot fracture. 2. Gastroesophageal reflux disease.   MEDICATIONS: Refer to the outpatient medical medication list.   ALLERGIES: Hydrocodone.   SOCIAL HISTORY: Patient is disabled. History of substance abuse.   PHYSICAL EXAMINATION:  GENERAL: Patient no acute distress.   ABDOMEN: Soft, nontender.   GENITOURINARY: Phallus is circumcised without lesions. There is an approximately 10 cm transilluminating right hemiscrotal mass consistent with hydrocele. The right testis is not palpable. There is a small left hydrocele present.   IMPRESSION: Bilateral hydroceles, right much greater than left.   PLAN: Patient's right hydrocele is symptomatic. Aspiration was discussed, however, the high reaccumulation rate was discussed. Best treatment would be hydrocelectomy which can be scheduled as an outpatient. Recommend he follow up in our office for scheduling and further discussion.   ____________________________ Verna CzechScott C. Lonna CobbStoioff, MD scs:cms D: 01/05/2012 17:46:22 ET T: 01/06/2012 09:52:20 ET JOB#: [id number]  cc: Lorin PicketScott C. Lonna CobbStoioff, MD, <Dictator> Riki AltesSCOTT C Madhuri Vacca MD ELECTRONICALLY SIGNED 01/13/2012 8:05

## 2014-06-21 NOTE — Consult Note (Signed)
Brief Consult Note: Diagnosis: Schizoaffective DO, Opioid Dependence.   Patient was seen by consultant.   Recommend further assessment or treatment.   Orders entered.   Comments: Pt seen in ED [facility]. He stated that he is doing better now and the w/d symptoms are improving. He continues to have neck and back pain. He stated that he is taking the medications as prescribed. he denied SI/HI or plans. He is not experiencing any w/d symptoms.   Plan: Pt will be transferred to [facility] today.  He will continue with the medications as prescribed.  No SI/HI noted.  Electronic Signatures: Rhunette CroftFaheem, Latina Frank S (MD)  (Signed 19-Jun-14 10:43)  Authored: Brief Consult Note   Last Updated: 19-Jun-14 10:43 by Rhunette CroftFaheem, Staria Birkhead S (MD)
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
## 2014-06-21 NOTE — Consult Note (Signed)
PATIENT NAME:  [name], Daniel [name]#:  [id number] DATE OF BIRTH:  21-Jun-1965  DATE OF CONSULTATION:  10/14/2012  REFERRING PHYSICIAN:   CONSULTING PHYSICIAN:  Jerri Hargadon K. Guss Bundehalla, MD  PLACE OF DICTATION:  CDU, room #34, [facility] [place].  AGE:  49 years.  SEX:  Male.  RACE:  White.  SUBJECTIVE:  The patient was seen in consultation, room #34 in CDU, [facility], [place], [place].  The patient is a 49 year old white male with a long history of mental illness and is being followed for schizoaffective disorder and personality disorder.  The patient reports that he was married for 30 years and he has charges for domestic violence and went to jail for 5 months and was released and went to live at homeless shelter.  The patient ran out of his medications and he started getting depressed and went to [facility] and IVC was taken out by Dr. Lenis NoonLevine and was sent here.    ALCOHOL AND DRUGS:  Denied.  MENTAL STATUS:  The patient is alert and oriented.  Admits feeling depressed, low and down and according to Dr. Chelsea PrimusLevine's notes he was suicidal, not suicidal ideas, but currently patient reports these are under control and he does not feel that way anymore, though he feels low and down.  Denies hearing voices saying anything.  No psychosis.  Insight and judgment guarded.  IMPRESSION:  Schizoaffective disorder, depressed.   RECOMMENDATIONS:  Recommend continue and start back on the following medications.  1.  Klonopin 1 mg by mouth 3 times daily.  2.  Neurontin 600 mg by mouth 4 times daily. 3.  Lexapro 20 mg per day.   The patient will be evaluated by [name]. Theodosia PalingKent Smith on Monday, 10/16/2012, for going back to homeless shelter or proper placement and follow up with outpatient psychiatrist.     ____________________________ Jannet MantisSurya K. Guss Bundehalla, MD skc:ea D: 10/14/2012 19:18:52 ET T: 10/14/2012 23:26:50 ET JOB#: [id number]  cc: Monika SalkSurya K. Guss Bundehalla, MD, <Dictator> Beau FannySURYA K Naif Alabi MD ELECTRONICALLY SIGNED  10/15/2012 15:01
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
## 2014-06-21 NOTE — Consult Note (Signed)
Brief Consult Note: Diagnosis: Schizoaffective DO, Opioid Dependence.   Patient was seen by consultant.   Consult note dictated.   Recommend further assessment or treatment.   Orders entered.   Comments: [name] has a long h/o depression and substance abuse. He was brought to ER after he expressed passive suicidal ideation to [facility] psychiatrist. He is no longer suicidal or homicidal. He is eager to return to the shelter to work on his housing application. He just completed [facility] treatment.  Plan: 1. The patient no longer meets criteria for IVC. I will terminate proceedings. Please discharge as appropriate.   2. He is to continue Lexapro and Trazodone for sleep/depresion and Neurontin for pain. He also asks for Prilosec. Rx given.   3. He will follow up with [facility]. Peer support team will see him at the shelter tomorrow..  Electronic Signatures: Kristine LineaPucilowska, Shainna Faux (MD)  (Signed 18-Aug-14 14:01)  Authored: Brief Consult Note   Last Updated: 18-Aug-14 14:01 by Kristine LineaPucilowska, Deniece Rankin (MD)
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
## 2014-06-22 NOTE — Op Note (Signed)
PATIENT NAME:  Daniel [name], [name] MR#:  [id number] DATE OF BIRTH:  12-23-1965  DATE OF PROCEDURE:  03/19/2013  PREOPERATIVE DIAGNOSIS:  Bilateral spermatoceles.   POSTOPERATIVE DIAGNOSIS:  Bilateral spermatoceles.   PROCEDURE:  Bilateral spermatocelectomy.   ESTIMATED BLOOD LOSS:  100 mL, mostly from the huge right spermatocele.   ANESTHESIA:  General.   SURGEON:  Richard D. Edwyna ShellHart, DO  PROCEDURE IN DETAIL:  With the patient sterilely prepped and draped in the supine position for ease of approach to the testicle, I began my dissection of the massive right spermatocele. It had 800 mL of fluid in it. Edges were very bloody, and it took a long time to control the bleeding in this patient. However, once I had incised the spermatocele and incised away most of the tissue, being careful not to cut the spermatic cord or the vas deferens, I was able to successfully complete the dissection. Dissection was done with cautery and scissors dissection. The walls of the spermatocele were very thickened. Once I had controlled bleeding on the right side, I then went through the midline tunica vaginalis to then bring the other testicle into the field. I removed the spermatocele, which was a lobulated smaller spermatocele occupying most of the inferior portion of this spermatocele. The patient tolerated this procedure well. Bleeding was well controlled, and I sent him to recovery in satisfactory condition with the incision closed with a running 3-0 Vicryl and a drain placed communicating with both portions of the sac. This resulted in the 1-inch Penrose being sutured in with 3-0 Vicryl, and the incision covered with Dermabond once it had been closed in a subcuticular fashion with a 3-0 running Vicryl suture. Fluff dressing was utilized. He was sent to recovery in satisfactory condition.     ____________________________ Caralyn Guileichard D. Edwyna ShellHart, DO rdh:ms D: 03/19/2013 16:05:29 ET T: 03/19/2013 22:48:13  ET JOB#: [id number]  cc: Caralyn Guileichard D. Edwyna ShellHart, DO, <Dictator> RICHARD D HART DO ELECTRONICALLY SIGNED 04/02/2013 16:35

## 2014-06-22 NOTE — Consult Note (Signed)
PATIENT NAME:  Daniel [name], Daniel [name] MR#:  [id number] DATE OF BIRTH:  10-08-1965  DATE OF CONSULTATION:  08/11/2013  REFERRING PHYSICIAN:  Darien Ramusavid [name]. Kaminski, MD, in the Emergency Room. CONSULTING PHYSICIAN:  Duane LopeJeffrey D. Judithann SheenSparks, MD  FAMILY PHYSICIAN: Silas FloodSheikh A. Ellsworth Lennoxejan-Sie, MD  REASON FOR CONSULTATION: Abdominal pain.   HISTORY OF PRESENT ILLNESS: The patient is a 49 year old male with a history of schizoaffective disorder, chronic back pain, gout, osteoarthritis and history of testicular hydrocele. Had crush injury several years ago to his back. Presents to the Emergency Room today with 2-week history of worsening right lower quadrant abdominal pain radiating through to the back. Having some numbness in his legs. The pain is worse when he sits up or lifts his leg. Denies fevers. No nausea or vomiting. No change in bowel habits. No melena or bright red blood per rectum. In the Emergency Room, the patient appeared in some distress and was tachycardic initially. He was given IV pain medications, with improvement of his symptoms.   PAST MEDICAL HISTORY:  1. Schizoaffective disorder.  2. Chronic back pain.  3. Chronic foot pain.  4. History of previous lumbar spine crush injury.  5. Gout.  6. Osteoarthritis.  7. History of testicular hydrocele.   MEDICATIONS:  1. Trazodone 100 mg p.o. at bedtime.  2. Neurontin 600 mg p.o. t.i.d.  3. Lexapro 20 mg p.o. daily.  4. Amitriptyline 100 mg p.o. at bedtime.   ALLERGIES: VICODIN.   SOCIAL HISTORY: The patient has a history of tobacco abuse, but none recently. Denies alcohol abuse.   FAMILY HISTORY: Positive for hypertension and stroke. Otherwise unremarkable.   REVIEW OF SYSTEMS:  CONSTITUTIONAL: No fever or change in weight.  EYES: No blurred or double vision. No glaucoma.  ENT: No tinnitus or hearing loss. No nasal discharge or bleeding. No difficulty swallowing.  RESPIRATORY: No cough or wheezing. Denies hemoptysis.  CARDIOVASCULAR: No chest pain or  orthopnea. No palpitations or syncope.  GASTROINTESTINAL: No nausea, vomiting or diarrhea. No change in bowel habits.  GENITOURINARY: No dysuria or hematuria. No incontinence.  ENDOCRINE: No polyuria or polydipsia. No heat or cold intolerance.  HEMATOLOGIC: The patient denies anemia, easy bruising or bleeding.  LYMPHATIC: No swollen glands.  MUSCULOSKELETAL: The patient denies pain in his back and hips. Denies neck or shoulder pain. No recent gout attacks.  NEUROLOGIC: No numbness or migraines. Denies stroke or seizures.  PSYCHIATRIC: The patient denies anxiety, insomnia or depression.   PHYSICAL EXAMINATION:  GENERAL: The patient is currently in minimal distress.  VITAL SIGNS: Remarkable for a blood pressure of 132/73, heart rate 107, respiratory rate 20. Temperature 98.3. Saturation 95% on room air.  HEENT: Normocephalic, atraumatic. Pupils equally round and reactive to light and accommodation. Extraocular movements are intact. Sclerae are nonicteric. Conjunctivae are clear. Oropharynx is clear.  NECK: Supple without JVD. No adenopathy or thyromegaly is noted.  LUNGS: Clear to auscultation and percussion, without wheezes, rales or rhonchi. No dullness. Respiratory effort is normal.  CARDIAC: Rapid rate with a regular them. Normal S1 and S2. No significant rubs, murmurs or gallops. PMI is nondisplaced. Chest wall is nontender.  ABDOMEN: Soft, but tender in the right lower quadrant. No rebound or guarding. No organomegaly or masses were appreciated.  EXTREMITIES: Without clubbing, cyanosis or edema. Pulses were 2+ bilaterally.  SKIN: Warm and dry, without rash or lesions.  NEUROLOGIC: Revealed cranial nerves II through XII grossly intact. Deep tendon reflexes were symmetric. Motor and sensory exam is nonfocal.  PSYCHIATRIC: Revealed  a patient who was alert and oriented to person, place and time. He was cooperative and used good judgment.   LABORATORY DATA: CT of the abdomen was negative except  for a chronic T12 compression fracture. Urinalysis was negative. White count was 9.2 with a hemoglobin of 13.8. His lipase was 203 with a troponin less than 0.02.   ASSESSMENT:  1. Abdominal pain of unclear etiology.  2. Chronic T12 compression fracture.  3. Schizoaffective disorder.  4. Tachycardia.   RECOMMENDATIONS: The pain has improved with treatment in the Emergency Room. The patient currently appears comfortable, in no acute distress. No definitive diagnosis has been reached in the Emergency Room, but he appears stable to be discharged home. Would recommend close outpatient followup and possibly a surgical consultation.   Thank you for the consultation. Please call if questions arise.   ____________________________ Duane Lope. Judithann Sheen, MD jds:lb D: 08/11/2013 11:22:46 ET T: 08/11/2013 11:40:09 ET JOB#: [id number]  cc: Duane Lope. Judithann Sheen, MD, <Dictator> Tiann Saha Rodena Medin MD ELECTRONICALLY SIGNED 08/11/2013 17:44

## 2014-06-22 NOTE — Consult Note (Signed)
Chief Complaint:  Subjective/Chief Complaint Asked to see pt with abd. pain, worse with movement. See dictation for details.   Brief Assessment:  GEN no acute distress   Cardiac Regular   Respiratory clear BS   Gastrointestinal details normal Soft  No rebound tenderness  No gaurding  tender RLQ   EXTR negative edema   Lab Results: Routine Chem:  13-Jun-15 06:00   Glucose, Serum  120  BUN 13  Creatinine (comp) 1.07  Sodium, Serum 139  Potassium, Serum  3.3  Chloride, Serum 106  CO2, Serum 26  eGFR (Non-African American) >60 (eGFR values <60mL/min/1.73 m2 may be an indication of chronic kidney disease (CKD). Calculated eGFR is useful in patients with stable renal function. The eGFR calculation will not be reliable in acutely ill patients when serum creatinine is changing rapidly. It is not useful in  patients on dialysis. The eGFR calculation may not be applicable to patients at the low and high extremes of body sizes, pregnant women, and vegetarians.)  Lipase 203 (Result(s) reported on 11 Aug 2013 at 06:45AM.)  Routine UA:  13-Jun-15 06:36   Blood (UA) Negative  RBC (UA) 1 /HPF  WBC (UA) NONE SEEN  Bacteria (UA) NONE SEEN  Routine Hem:  13-Jun-15 06:00   WBC (CBC) 9.2  Hemoglobin (CBC) 13.8  Platelet Count (CBC) 247   Radiology Results: CT:    13-Jun-15 08:24, CT Abdomen and Pelvis With Contrast  CT Abdomen and Pelvis With Contrast   REASON FOR EXAM:    (1) pain - RLQ.  Hx of R testicular hydrocele 3   months ago, no testicular pain n  COMMENTS:       PROCEDURE: CT  - CT ABDOMEN / PELVIS  W  - Aug 11 2013  8:24AM     CLINICAL DATA:  Right lower quadrant pain    EXAM:  CT ABDOMEN AND PELVIS WITH CONTRAST    TECHNIQUE:  Multidetector CT imaging of the abdomen and pelvis was performed  using the standard protocol following bolus administration of  intravenous contrast.  CONTRAST:  125 cc of Isovue 370    COMPARISON:  02/26/2013    FINDINGS:  The lung  bases are clear.  No pleural or pericardial effusion.    No suspicious liver abnormality. The gallbladder is normal. No  biliary dilatation. Normal appearance of the pancreas. The spleen is  unremarkable.    The adrenal glands both appear normal. Normal appearance of the  kidneys. The urinary bladder appears normal. The prostate gland and  seminal vesicles are unremarkable.  Mild calcified atherosclerotic disease involves the abdominal aorta.  There is no aneurysm. No upper abdominal adenopathy identified.  There is no pelvic or inguinal adenopathy noted.    No free fluid or fluid collections within the abdomen or the pelvis.    The stomach is normal. The small bowel loops have a normal course  and caliber. No obstruction. Normal appearance of the appendix. The  colon appears on unremarkable.    Review of the visualized bony structures is significant for mild  lumbar spondylosis. Stable compression deformity involving the T12  vertebra.     IMPRESSION:  1. No acute findings within the abdomen or pelvis.  2. Chronic T12 compression fracture      Electronically Signed    By: Taylor  Stroud M.D.    On: 08/11/2013 08:33         Verified By: TAYLOR H. STROUD, M.D.,   Assessment/Plan:  Assessment/Plan:  Plan   Abd. pain of unclear etiology but improved after tx in ER. [place] for D/C home with outpatient F/u. Consider Surgery consult.   Electronic Signatures: Idelle Crouch (MD)  (Signed 13-Jun-15 11:17)  Authored: Chief Complaint, Brief Assessment, Lab Results, Radiology Results, Assessment/Plan   Last Updated: 13-Jun-15 11:17 by Idelle Crouch (MD)

## 2014-06-23 NOTE — H&P (Signed)
PATIENT NAME:  Daniel, [name] MR#:  [id number] DATE OF BIRTH:  1965-05-15  DATE OF ADMISSION:  06/23/2011  CHIEF COMPLAINT AND IDENTIFYING DATA: Daniel [name] is a 49 year old male presenting to the Emergency Department with severe depressed mood, thoughts of hopelessness, helplessness, and inability to carry out his activities of daily living.   HISTORY OF PRESENT ILLNESS: Daniel [name] has experienced a number of stresses. His wife has left him.  He has over eight weeks of progressive depressed mood, low energy, poor concentration, and poor appetite. He has lost 100 pounds. He has very poor energy and cannot perform his activities of daily living.   Other stressors include chronic pain. He has received foreclosure papers on his house.   He does suffer chronic pain with back injury. He has been given regular narcotic treatment by his primary care physician including fentanyl patches and Percocet for breakthrough pain.   His orientation and memory function are intact.   PAST PSYCHIATRIC HISTORY: Daniel [name] has been treated for recurrent depression successfully with Lexapro combined with trazodone; however, he has been without his medicines for several weeks. He states that the only time his depression has returned is when he goes without his medication.   Looking at a discharge summary from the [facility] in November 2007, Daniel [name]  was diagnosed with polysubstance dependence. However, the assessment acknowledged a chronic pain disorder. The patient reported at that time marijuana use. The psychiatrist reported that the patient gave a strong history of substance abuse since childhood.    FAMILY PSYCHIATRIC HISTORY: None known.   SOCIAL HISTORY: Please see the above.   PAST MEDICAL HISTORY:  1. Chronic back pain.  2. Hypertension.  3. Gastroesophageal reflux disease.  4. Arthritis, particularly in the left shoulder.  5. History of left knee arthroscopy.   ALLERGIES: No known  allergies. Vicodin. ?   PRESCRIBED MEDICATIONS PRIOR TO HOSPITALIZATION:  1. Celebrex 200 mg daily.  2. Chlorthalidone 25 mg daily.  3. Cyclobenzaprine 10 mg t.i.d.  4. Fentanyl 25-mcg transdermal patch q. 48 hours.  5. Gabapentin 600 mg q.i.d.  6. Lexapro 10 mg q.a.m.  7. Omeprazole 40 mg daily. 8. Percocet 7.5/325 q. 6 hours p.r.n. breakthrough pain.  9. Trazodone 50 mg 1 to 2 at bedtime.  10. Colace 100 mg b.i.d.  LABORATORY, DIAGNOSTIC, AND RADIOLOGICAL DATA: Urinalysis unremarkable. Lipase is unremarkable. Urine drug screen positive for cocaine, positive for methadone. The patient denies that he has been on methadone on a regular basis. However, he does acknowledge that he took a methadone tablet from a friend because he was out is pain medication and was in excruciating pain. TSH is normal. Salicylates unremarkable. CBC unremarkable. Ethanol negative. Complete metabolic panel unremarkable. Tylenol negative.   REVIEW OF SYSTEMS: Constitutional, HEENT, mouth, neurologic, psychiatric, cardiovascular, respiratory, gastrointestinal, genitourinary, skin, musculoskeletal, hematologic, lymphatic, endocrine, metabolic all unremarkable.   PHYSICAL EXAMINATION:  VITAL SIGNS: Temperature 97.5, pulse 106, respiratory rate 24, blood pressure 107/81.   GENERAL APPEARANCE: Daniel [name] is a 49 year old male lying in the left lateral decubitus position in his hospital bed with mildly cachectic appearance. No abnormal involuntary movements. He has disheveled grooming. Hygiene normal.   He is alert. His concentration is mildly decreased. Eye contact intermittent. He is oriented to all spheres. Memory is intact to immediate, recent, and remote. Fund of knowledge, use of language and intelligence are normal. Speech involves normal rate and prosody without dysarthria. Thought process is logical, coherent, and goal directed. No  looseness of associations. Thought content: Profound thoughts of hopelessness and  helplessness. He has no hallucinations or delusions and no thoughts of harming others or himself. Affect is very constricted. Mood is profoundly depressed. Insight is partial. Judgment is intact for the need of psychiatric treatment.   HEENT:  Head normocephalic, atraumatic. Pupils equally round and reactive to light and accommodation. Oropharynx clear without erythema.   EXTREMITIES: No cyanosis, clubbing, or edema.   SKIN: No rashes. Normal turgor.   RESPIRATORY: Clear to auscultation. No wheezing, rhonchi, or rales.   CARDIOVASCULAR: Regular rate and rhythm. No murmurs, rubs, or gallops.   ABDOMEN: Nondistended. Bowel sounds positive. No masses or organomegaly. No tenderness.   GENITOURINARY: Deferred.   NEUROLOGIC: Cranial nerves II through XII intact. General sensory intact throughout to light touch. Motor 5/5 strength throughout. Coordination intact by finger-to-nose bilaterally. Deep tendon reflexes normal strength and symmetry throughout. No Babinski.   ASSESSMENT:  AXIS I: Major depressive disorder, recurrent, severe.  Rule out polysubstance dependence.  Clearly Daniel [name] does have a history of abusing substances.  However, he does have a history of nociceptive pain and prescription of narcotic medicine for control of the nociceptive pain.   AXIS II: Deferred.   AXIS III: Chronic arthritic pain.   AXIS IV: General medical, primary support group, marital, economic.   AXIS V: 30.   Daniel [name], given his severity of neurovegetative symptoms, would be at risk for potential lethal self neglect.   PLAN:  1. Therefore, we will admit Daniel [name] to the inpatient behavioral health unit for further evaluation and treatment of depression.  He wants to be restarted on his previous effective anti-depression regimen. Please see the above.  2. He will undergo milieu and group psychotherapy as he becomes able.  3. Regarding opiates, Daniel [name] does not want to try to detox off of opiates  because they have been required to treat his general medical pain adequately despite alternative trials.  The consultation liaison text for [facility] recommends in this kind of condition with a substance dependent background, that the opioid regimen be titrated to an adequate standing dose. No p.r.n.s are given so that an oral dependent pattern will not be reinforced. The patient's opioid regimen will be ordered as he was previously prescribed with the goal of maintaining pain control but not reinforcing a p.r.n. process. Have will have a 25-mcg fentanyl patch as well as the Percocet adjusted to any standing dose required in addition to the fentanyl.   4. Regarding his antidepressant medications, the Lexapro will be started first. He understands the indications, alternatives, and adverse effects and wants to proceed.     ____________________________ Adelene AmasJames S. Marirose Deveney, MD jsw:bjt D: 06/23/2011 21:42:10 ET T: 06/24/2011 06:40:44 ET JOB#: [id number]  cc: Adelene AmasJames S. Maurita Havener, MD, <Dictator> Lester CarolinaJAMES S Broghan Pannone MD ELECTRONICALLY SIGNED 06/30/2011 1:46

## 2014-06-23 NOTE — Consult Note (Signed)
Brief Consult Note: Diagnosis: Mood disorder NOS, Cocaine dependence, opioid dependence.   Patient was seen by consultant.   Consult note dictated.   Recommend further assessment or treatment.   Orders entered.   Discussed with Attending MD.   Comments: [name] has a h/o mood instability and substance abuse. He was [facility] for worsening depression in the context of treatment noncompliance. She returns to the ER complaining of suicidal ideations with a plan to kill himself with CO while in fact he run out of pain medications. He is a client at [facility] in [place] where he has been receiving 65 mg of Methadone daily since 06/30/2011. He is also being prescribed Fentanyl patches and Percocets by Dr. Kinnie [name].    MSE: Alert, oriented, preasant, cooperative. no physical complaints until he has learned that he will be transferred to [facility] rehab facility for susbtance abuse treatment. He denies thoughts of suicide or homicide.   PLAN: 1. The patient will be transferred to [facility] via sherriff on 06/30/2011 at 10:00 am.  2. We did not offer painkillers. The patient does not complain of pain. Symptomatic treatment was offered for symptoms of withdrawal.   3. We restarted Lexapro and Remeron.  4. I will follow up.  Electronic Signatures: Daniel [name], Daniel [name] (MD)  (Signed 30-Apr-13 22:21)  Authored: Brief Consult Note   Last Updated: 30-Apr-13 22:21 by Daniel [name], Daniel [name] (MD)
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
## 2014-06-23 NOTE — Discharge Summary (Signed)
PATIENT NAME:  Daniel [name], Daniel [name] MR#:  [id number] DATE OF BIRTH:  1965-12-12  DATE OF ADMISSION:  06/23/2011 DATE OF DISCHARGE:  06/25/2011  HISTORY OF PRESENT ILLNESS: [name] is a 49 year old male admitted to the inpatient behavioral health unit on the 24th of April due to severe depressed mood and severe depressed energy to the point of not being able to carry out activities of daily living, being at risk for self-neglect. He also was experiencing hopelessness, helplessness, and anhedonia as well as insomnia.   [name] had experienced a number of catastrophic stresses over the previous eight months, including his wife leaving him. He had lost 100 pounds. He had received foreclosure papers on his house. He also was suffering progressive back pain due to a back injury, which has been under the treatment of a general medical team.  ANCILLARY CLINICAL DATA: It was explained to [name] that with his history of dependence that he would require a standing regimen of opioid pain treatment with no p.r.n. He understood that this was standard of care [facility] text book for consultation liaison psychiatry).  He was placed on fentanyl 15-mcg patch q.72 h. with an order for Percocet 325/7.5 mg q. 6 hours. This dosage was reached estimating the minimal opioid load that would keep him comfortable but not provide side effects and not escalate any psychologic dependence.   [name] was restarted on his Lexapro for anti-depression. It had been effective in the past and after the second day of receiving Lexapro he already had been feeling better.   CONDITION ON DISCHARGE: By the 26th of April [name] is experiencing normal mood and energy. His hope is intact. His interests are intact. He does express that he needs to get back to his general medical physician for further evaluation and treatment of his back pain. He is socially appropriate and cooperative.   He is not having any adverse  Lexapro effects.   MENTAL STATUS EXAM UPON DISCHARGE: [name] is alert. His eye contact is good. He is oriented to all spheres. Memory is intact to immediate, recent, and remote. Fund of knowledge, intelligence, and use of language are normal. Speech involves normal rate and prosody without dysarthria. Thought process is logical, coherent, and goal directed. No looseness of associations. No tangents. Thought content: No thoughts of harming himself or others. No delusions or hallucinations. His affect is broad and appropriate. Mood within normal limits. Insight intact. Judgment intact.   DISCHARGE DIAGNOSES:  AXIS I:  Major depressive disorder, recurrent, in clinical remission. History of polysubstance dependence.   AXIS II: Deferred.   AXIS III: Chronic low back pain.   AXIS IV: General medical, primary support group, marital, economic.   AXIS V: 55.   [name] is not at risk to harm himself or others. He agrees to call emergency services immediately for any thoughts of harming himself, thoughts of harming others, or distress.   He agrees to not drive if drowsy.   DIET: Regular.   ACTIVITY: Routine.   DISCHARGE MEDICATIONS:  1. Lexapro 10 mg q. a.m.  2. Percocet 325/7.5, 1 q. 6 hours for pain, dispense #12. 3. Celebrex 200 mg daily.  4. Chlorthalidone 25 mg daily.  5. Cyclobenzaprine 10 mg q. 8 hours.  6. Gabapentin 600 mg q.i.d.  7. Prilosec 40 mg q. a.m.  8. Colace 100 mg b.i.d.     FOLLOW-UP APPOINTMENTS:  1. [facility], Dr. Janeece Riggers 06/29/2011 at 11:45. This is  Daniel [name]'s established outpatient care. 2. Daniel [name] will follow up with his primary care provider within the first two weeks of discharge.    ____________________________ Adelene AmasJames S. Esley Brooking, MD jsw:bjt D: 06/28/2011 17:18:08 ET T: 06/29/2011 10:51:24 ET JOB#: [id number]  cc: Adelene AmasJames S. Stavros Cail, MD, <Dictator> Lester CarolinaJAMES S Eli Pattillo MD ELECTRONICALLY SIGNED 06/30/2011 1:47
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
## 2014-08-02 ENCOUNTER — Encounter: Payer: Self-pay | Admitting: Emergency Medicine

## 2014-08-02 ENCOUNTER — Emergency Department
Admission: EM | Admit: 2014-08-02 | Discharge: 2014-08-02 | Disposition: A | Discharge status: Home / Self Care | Payer: Medicaid Other | Attending: Emergency Medicine | Admitting: Emergency Medicine

## 2014-08-02 DIAGNOSIS — M549 Dorsalgia, unspecified: Secondary | ICD-10-CM | POA: Insufficient documentation

## 2014-08-02 DIAGNOSIS — G8929 Other chronic pain: Secondary | ICD-10-CM | POA: Diagnosis not present

## 2014-08-02 DIAGNOSIS — Z87891 Personal history of nicotine dependence: Secondary | ICD-10-CM | POA: Insufficient documentation

## 2014-08-02 DIAGNOSIS — F322 Major depressive disorder, single episode, severe without psychotic features: Secondary | ICD-10-CM | POA: Diagnosis not present

## 2014-08-02 DIAGNOSIS — I10 Essential (primary) hypertension: Secondary | ICD-10-CM | POA: Diagnosis not present

## 2014-08-02 DIAGNOSIS — F329 Major depressive disorder, single episode, unspecified: Secondary | ICD-10-CM | POA: Diagnosis not present

## 2014-08-02 DIAGNOSIS — R000 Tachycardia, unspecified: Secondary | ICD-10-CM | POA: Insufficient documentation

## 2014-08-02 DIAGNOSIS — Z79899 Other long term (current) drug therapy: Secondary | ICD-10-CM | POA: Insufficient documentation

## 2014-08-02 DIAGNOSIS — F32A Depression, unspecified: Secondary | ICD-10-CM

## 2014-08-02 HISTORY — DX: Essential (primary) hypertension: I10

## 2014-08-02 LAB — URINALYSIS COMPLETE WITH MICROSCOPIC (ARMC ONLY)
Bacteria, UA: NONE SEEN
Bilirubin Urine: NEGATIVE
Glucose, UA: 50 mg/dL — AB
HGB URINE DIPSTICK: NEGATIVE
Ketones, ur: NEGATIVE mg/dL
LEUKOCYTES UA: NEGATIVE
Nitrite: NEGATIVE
PROTEIN: NEGATIVE mg/dL
RBC / HPF: NONE SEEN RBC/hpf (ref 0–5)
SQUAMOUS EPITHELIAL / LPF: NONE SEEN
Specific Gravity, Urine: 1 — ABNORMAL LOW (ref 1.005–1.030)
WBC, UA: NONE SEEN WBC/hpf (ref 0–5)
pH: 5 (ref 5.0–8.0)

## 2014-08-02 LAB — URINE DRUG SCREEN, QUALITATIVE (ARMC ONLY)
Amphetamines, Ur Screen: NOT DETECTED
BARBITURATES, UR SCREEN: NOT DETECTED
Benzodiazepine, Ur Scrn: NOT DETECTED
CANNABINOID 50 NG, UR ~~LOC~~: NOT DETECTED
COCAINE METABOLITE, UR ~~LOC~~: NOT DETECTED
MDMA (ECSTASY) UR SCREEN: NOT DETECTED
METHADONE SCREEN, URINE: NOT DETECTED
OPIATE, UR SCREEN: NOT DETECTED
Phencyclidine (PCP) Ur S: NOT DETECTED
Tricyclic, Ur Screen: NOT DETECTED

## 2014-08-02 LAB — CBC
HCT: 40.2 % (ref 40.0–52.0)
HEMOGLOBIN: 13.5 g/dL (ref 13.0–18.0)
MCH: 30.4 pg (ref 26.0–34.0)
MCHC: 33.6 g/dL (ref 32.0–36.0)
MCV: 90.5 fL (ref 80.0–100.0)
Platelets: 242 10*3/uL (ref 150–440)
RBC: 4.44 MIL/uL (ref 4.40–5.90)
RDW: 14.2 % (ref 11.5–14.5)
WBC: 7.9 10*3/uL (ref 3.8–10.6)

## 2014-08-02 LAB — COMPREHENSIVE METABOLIC PANEL
ALT: 27 U/L (ref 17–63)
ANION GAP: 13 (ref 5–15)
AST: 22 U/L (ref 15–41)
Albumin: 4.2 g/dL (ref 3.5–5.0)
Alkaline Phosphatase: 66 U/L (ref 38–126)
BUN: 13 mg/dL (ref 6–20)
CALCIUM: 9 mg/dL (ref 8.9–10.3)
CO2: 22 mmol/L (ref 22–32)
CREATININE: 1.01 mg/dL (ref 0.61–1.24)
Chloride: 103 mmol/L (ref 101–111)
Glucose, Bld: 99 mg/dL (ref 65–99)
Potassium: 3.3 mmol/L — ABNORMAL LOW (ref 3.5–5.1)
Sodium: 138 mmol/L (ref 135–145)
Total Bilirubin: 0.5 mg/dL (ref 0.3–1.2)
Total Protein: 7.3 g/dL (ref 6.5–8.1)

## 2014-08-02 LAB — ACETAMINOPHEN LEVEL: Acetaminophen (Tylenol), Serum: <10 ug/mL — ABNORMAL LOW (ref 10–30)

## 2014-08-02 LAB — SALICYLATE LEVEL

## 2014-08-02 LAB — ETHANOL: Alcohol, Ethyl (B): <5 mg/dL (ref <5)

## 2014-08-02 MED ORDER — ARIPIPRAZOLE 5 MG PO TABS
5.0000 mg | ORAL_TABLET | Freq: Every day | ORAL | Status: DC
  Administered 2014-08-02: 5 mg via ORAL

## 2014-08-02 MED ORDER — OXYCODONE-ACETAMINOPHEN 5-325 MG PO TABS
2.0000 | ORAL_TABLET | Freq: Once | ORAL | Status: AC
  Administered 2014-08-02: 2 via ORAL

## 2014-08-02 MED ORDER — LORAZEPAM 1 MG PO TABS
1.0000 mg | ORAL_TABLET | Freq: Once | ORAL | Status: AC
  Administered 2014-08-02: 1 mg via ORAL

## 2014-08-02 MED ORDER — ONDANSETRON 4 MG PO TBDP
ORAL_TABLET | ORAL | Status: AC
  Administered 2014-08-02: 4 mg via ORAL
  Filled 2014-08-02: qty 1

## 2014-08-02 MED ORDER — ARIPIPRAZOLE 5 MG PO TABS
ORAL_TABLET | ORAL | Status: AC
  Filled 2014-08-02: qty 1

## 2014-08-02 MED ORDER — LORAZEPAM 1 MG PO TABS
ORAL_TABLET | ORAL | Status: AC
  Administered 2014-08-02: 1 mg via ORAL
  Filled 2014-08-02: qty 1

## 2014-08-02 MED ORDER — ONDANSETRON 4 MG PO TBDP
4.0000 mg | ORAL_TABLET | Freq: Once | ORAL | Status: AC
  Administered 2014-08-02: 4 mg via ORAL

## 2014-08-02 MED ORDER — OXYCODONE-ACETAMINOPHEN 5-325 MG PO TABS
2.0000 | ORAL_TABLET | Freq: Four times a day (QID) | ORAL | Status: DC | PRN
  Administered 2014-08-02 (×2): 2 via ORAL
  Filled 2014-08-02: qty 2

## 2014-08-02 MED ORDER — OXYCODONE-ACETAMINOPHEN 5-325 MG PO TABS
ORAL_TABLET | ORAL | Status: AC
  Administered 2014-08-02: 2 via ORAL
  Filled 2014-08-02: qty 2

## 2014-08-02 NOTE — ED Notes (Signed)
Pt laying in bed watching tv calm and cooperative at this time, pt pending discharge will continue to monitor.

## 2014-08-02 NOTE — ED Notes (Addendum)
Pt here with c/o depression "for a while"; states he "wants to go to the basement, that would make me feel better, I've been down there before". Pt also reports recent oral surgery, was prescribed oxycodone q6 hr and was taking them q4hr, reports he is out of medication and also has chronic lower back pain- seen at [facility] pain clinic. Pt ambulatory to stretcher in hallway. Pt states last oxycodone was Sunday, pt reports "rolling stomach", nausea; denies vomiting or diarrhea.

## 2014-08-02 NOTE — ED Provider Notes (Signed)
[facility] Emergency Department Provider Note  ____________________________________________  Time seen: Approximately 1:44 AM  I have reviewed the triage vital signs and the nursing notes.   HISTORY  Chief Complaint Depression    HPI Daniel [name] is a 49 y.o. male who presents voluntarily for depression. Patient states he has been battling depression for several years. Reports recent oral surgery for which she was prescribed oxycodone which she has run out of. Patient missed his pain clinic appointment yesterday and was rescheduled for June 16. Patient complains of nausea, chronic back pain and "nerves". Patient denies active SI/HI/AH/VH.   Past Medical History  Diagnosis Date  . Hypertension     There are no active problems to display for this patient.   Past Surgical History  Procedure Laterality Date  . Knee surgery Left     Current Outpatient Rx  Name  Route  Sig  Dispense  Refill  . amitriptyline (ELAVIL) 150 MG tablet   Oral   Take 150 mg by mouth at bedtime.         Marland Kitchen escitalopram (LEXAPRO) 20 MG tablet   Oral   Take 20 mg by mouth daily.         Marland Kitchen gabapentin (NEURONTIN) 600 MG tablet   Oral   Take 600 mg by mouth 3 (three) times daily.         . naproxen sodium (ANAPROX) 220 MG tablet   Oral   Take 440 mg by mouth daily as needed (pain). Aleve         . oxyCODONE-acetaminophen (PERCOCET) 10-325 MG per tablet   Oral   Take 1 tablet by mouth 4 (four) times daily.         . traZODone (DESYREL) 100 MG tablet   Oral   Take 100 mg by mouth at bedtime.           Allergies Hydrocodone-acetaminophen  No family history on file.  Social History History  Substance Use Topics  . Smoking status: Former Games developer  . Smokeless tobacco: Not on file  . Alcohol Use: No    Review of Systems Constitutional: No fever/chills Eyes: No visual changes. ENT: No sore throat. Cardiovascular: Denies chest pain. Respiratory:  Denies shortness of breath. Gastrointestinal: No abdominal pain.  No nausea, no vomiting.  No diarrhea.  No constipation. Genitourinary: Negative for dysuria. Musculoskeletal: Positive for chronic back pain. Skin: Negative for rash. Neurological: Negative for headaches, focal weakness or numbness. Psychiatric:Positive for depression.  10-point ROS otherwise negative.  ____________________________________________   PHYSICAL EXAM:  VITAL SIGNS: ED Triage Vitals  Enc Vitals Group     BP 08/02/14 0106 113/91 mmHg     Pulse Rate 08/02/14 0106 111     Resp 08/02/14 0106 19     Temp 08/02/14 0106 98 F (36.7 C)     Temp Source 08/02/14 0106 Oral     SpO2 08/02/14 0106 97 %     Weight 08/02/14 0106 282 lb (127.914 kg)     Height 08/02/14 0106 6' (1.829 m)     Head Cir --      Peak Flow --      Pain Score 08/02/14 0106 8     Pain Loc --      Pain Edu? --      Excl. in GC? --     Constitutional: Alert and oriented. Well appearing, mildly anxious. Eyes: Conjunctivae are normal. PERRL. EOMI. Head: Atraumatic. Nose: No congestion/rhinnorhea. Mouth/Throat: Mucous membranes  are moist.  Oropharynx non-erythematous. Neck: No stridor.   Cardiovascular: Tachycardic, regular rhythm. Grossly normal heart sounds.  Good peripheral circulation. Respiratory: Normal respiratory effort.  No retractions. Lungs CTAB. Gastrointestinal: Soft and nontender. No distention. No abdominal bruits. No CVA tenderness. Musculoskeletal: No lower extremity tenderness nor edema.  No joint effusions. Neurologic:  Normal speech and language. No gross focal neurologic deficits are appreciated. Speech is normal. No gait instability. Skin:  Skin is warm, dry and intact. No rash noted. Psychiatric: Mood and affect are depressed. Speech and behavior are normal.  ____________________________________________   LABS (all labs ordered are listed, but only abnormal results are displayed)  Labs Reviewed   ACETAMINOPHEN LEVEL - Abnormal; Notable for the following:    Acetaminophen (Tylenol), Serum <10 (*)    All other components within normal limits  COMPREHENSIVE METABOLIC PANEL - Abnormal; Notable for the following:    Potassium 3.3 (*)    All other components within normal limits  URINALYSIS COMPLETEWITH MICROSCOPIC ([facility] ONLY) - Abnormal; Notable for the following:    Color, Urine YELLOW (*)    APPearance CLEAR (*)    Glucose, UA 50 (*)    Specific Gravity, Urine 1.000 (*)    All other components within normal limits  CBC  ETHANOL  SALICYLATE LEVEL  URINE DRUG SCREEN, QUALITATIVE ([facility] ONLY)   ____________________________________________  EKG  None ____________________________________________  RADIOLOGY  None ____________________________________________   PROCEDURES  Procedure(s) performed: None  Critical Care performed: No  ____________________________________________   INITIAL IMPRESSION / ASSESSMENT AND PLAN / ED COURSE  Pertinent labs & imaging results that were available during my care of the patient were reviewed by me and considered in my medical decision making (see chart for details).  49 year old male presents voluntarily for evaluation of depression. No active SI/HI/AH/VH. We'll keep patient voluntary as he awaits behavioral medicine evaluation.  ----------------------------------------- 6:41 AM on 08/02/2014 -----------------------------------------  Patient resting in no acute distress. Awaits psychiatry consult this morning. ____________________________________________   FINAL CLINICAL IMPRESSION(S) / ED DIAGNOSES  Final diagnoses:  Depression  Chronic back pain      Irean HongJade J Bell Cai, MD 08/02/14 [phone]

## 2014-08-02 NOTE — ED Notes (Signed)
meds given per md order  

## 2014-08-02 NOTE — ED Notes (Signed)
BEHAVIORAL HEALTH ROUNDING Patient sleeping: Yes.   Patient alert and oriented: not applicable Behavior appropriate: Yes.  ; If no, describe:  Nutrition and fluids offered: Yes  Toileting and hygiene offered: Yes  Sitter present: yes Law enforcement present: Yes  

## 2014-08-02 NOTE — ED Notes (Signed)
Pt dressed out in behavioral clothing; belongings locked up. Pt has on his own boxers due to not having any elastic underwear in supply room.

## 2014-08-02 NOTE — ED Notes (Signed)

## 2014-08-02 NOTE — ED Notes (Signed)
BEHAVIORAL HEALTH ROUNDING Patient sleeping: No. Patient alert and oriented: yes Behavior appropriate: Yes.  ; If no, describe:  Nutrition and fluids offered: Yes  Toileting and hygiene offered: Yes  Sitter present: yes Law enforcement present: Yes  

## 2014-08-02 NOTE — ED Provider Notes (Signed)
-----------------------------------------   9:29 PM on 08/02/2014 -----------------------------------------  The patient has been seen and evaluated by psychiatry and they believe that the patient is safe to go home with [facility] follow-up. Psychiatry is added Abilify prescription, and recommended follow-up with a behavioral therapist. We will discharge him at this time.  Minna AntisKevin Porter Nakama, MD 08/02/14 2129

## 2014-08-02 NOTE — ED Notes (Signed)
BEHAVIORAL HEALTH ROUNDING Patient sleeping: NO Patient alert and oriented: YES Behavior appropriate: YES Nutrition and fluids offered: YES Toileting and hygiene offered: YES Sitter present: YES Law enforcement present: YES 

## 2014-08-02 NOTE — Consult Note (Signed)
[facility] Face-to-Face Psychiatry Consult   Reason for Consult:  Consult for this 49 year old man with a history of anxiety and depression and substance abuse. Came into the hospital stating that he's been very depressed recently. Referring Physician:  goodman Patient Identification: Daniel [name] MRN:  [id number] Principal Diagnosis: Major depression, chronic Diagnosis:   Patient Active Problem List   Diagnosis Date Noted  . Major depression, chronic [F32.2] 08/02/2014  . Hypertension [I10] 08/02/2014  . Gastric reflux [K21.9] 08/02/2014    Total Time spent with patient: 1 hour  Subjective:   Daniel [name] is a 49 y.o. male patient admitted with "I'm just tired of feeling bad and thought I needed someone to talk to". Patient is complaining of chronic depression. No suicidality.Marland Kitchen  HPI:  Information from the patient and the chart. Patient brought himself into the hospital voluntarily saying that he just got up the nerve to call the crisis line. He's been chronically depressed for at least the last 2 years. He had been married for 30 years and then his wife left him. Ever since then he has been drawn and sad. Lives in a boarding house. Barely gets out of the house. Says that he is stopped abusing drugs completely and does not drink. He goes to see Dr. n Hai at [place] [facility] and takes a combination of psychiatric medicines which she thinks are only partially helpful. Patient denies any suicidal or homicidal ideation and denies any hallucinations or psychotic symptoms. Chronic stress from isolation and from his chronic back pain.  Past psychiatric history of cocaine abuse and depression and anxiety. 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 No known serious suicide attempts.  Social history: Patient is divorced. He has an adult daughter that he tries to stay in touch with. He lives by himself in a boarding house. Has no friends no social contact.  Medical  history: Chronic back pain. High blood pressure and gastric reflux disease.  Family history: Positive for depression  Current medication: He quotes Lexapro 20 mg a day gabapentin trazodone Elavil and says he also takes a blood pressure medicine and a gastric reflux medicine and takes Percocet 10 mg 4 times a day.  Substance abuse history: Past history of cocaine abuse but says that he stop doing that a couple years ago and doesn't drink or use any drugs currently. HPI Elements:   Quality:  Depressed mood and anxiety. Severity:  Moderate no suicidal ideation. Timing:  Chronic for 2 years with some slight increase. Duration:  Ongoing and chronic intermittent for years. Context:  Isolation and lack of social support and chronic pain.  Past Medical History:  Past Medical History  Diagnosis Date  . Hypertension     Past Surgical History  Procedure Laterality Date  . Knee surgery Left    Family History: No family history on file. Social History:  History  Alcohol Use No     History  Drug Use No    History   Social History  . Marital Status: Married    Spouse Name: N/A  . Number of Children: N/A  . Years of Education: N/A   Social History Main Topics  . Smoking status: Former Research scientist (life sciences)  . Smokeless tobacco: Not on file  . Alcohol Use: No  . Drug Use: No  . Sexual Activity: Not on file   Other Topics Concern  . None   Social History Narrative  . None   Additional Social History:  Allergies:   Allergies  Allergen Reactions  . Hydrocodone-Acetaminophen Hives    Vicodin/Norco    Labs:  Results for orders placed or performed during the hospital encounter of 08/02/14 (from the past 48 hour(s))  Acetaminophen level     Status: Abnormal   Collection Time: 08/02/14  1:15 AM  Result Value Ref Range   Acetaminophen (Tylenol), Serum <10 (L) 10 - 30 ug/mL    Comment:        THERAPEUTIC CONCENTRATIONS VARY SIGNIFICANTLY. A RANGE OF  10-30 ug/mL MAY BE AN EFFECTIVE CONCENTRATION FOR MANY PATIENTS. HOWEVER, SOME ARE BEST TREATED AT CONCENTRATIONS OUTSIDE THIS RANGE. ACETAMINOPHEN CONCENTRATIONS >150 ug/mL AT 4 HOURS AFTER INGESTION AND >50 ug/mL AT 12 HOURS AFTER INGESTION ARE OFTEN ASSOCIATED WITH TOXIC REACTIONS.   CBC     Status: None   Collection Time: 08/02/14  1:15 AM  Result Value Ref Range   WBC 7.9 3.8 - 10.6 K/uL   RBC 4.44 4.40 - 5.90 MIL/uL   Hemoglobin 13.5 13.0 - 18.0 g/dL   HCT 40.2 40.0 - 52.0 %   MCV 90.5 80.0 - 100.0 fL   MCH 30.4 26.0 - 34.0 pg   MCHC 33.6 32.0 - 36.0 g/dL   RDW 14.2 11.5 - 14.5 %   Platelets 242 150 - 440 K/uL  Comprehensive metabolic panel     Status: Abnormal   Collection Time: 08/02/14  1:15 AM  Result Value Ref Range   Sodium 138 135 - 145 mmol/L   Potassium 3.3 (L) 3.5 - 5.1 mmol/L   Chloride 103 101 - 111 mmol/L   CO2 22 22 - 32 mmol/L   Glucose, Bld 99 65 - 99 mg/dL   BUN 13 6 - 20 mg/dL   Creatinine, Ser 1.01 0.61 - 1.24 mg/dL   Calcium 9.0 8.9 - 10.3 mg/dL   Total Protein 7.3 6.5 - 8.1 g/dL   Albumin 4.2 3.5 - 5.0 g/dL   AST 22 15 - 41 U/L   ALT 27 17 - 63 U/L   Alkaline Phosphatase 66 38 - 126 U/L   Total Bilirubin 0.5 0.3 - 1.2 mg/dL   GFR calc non Af Amer >60 >60 mL/min   GFR calc Af Amer >60 >60 mL/min    Comment: (NOTE) The eGFR has been calculated using the CKD EPI equation. This calculation has not been validated in all clinical situations. eGFR's persistently <60 mL/min signify possible Chronic Kidney Disease.    Anion gap 13 5 - 15  Ethanol (ETOH)     Status: None   Collection Time: 08/02/14  1:15 AM  Result Value Ref Range   Alcohol, Ethyl (B) <5 <5 mg/dL    Comment:        LOWEST DETECTABLE LIMIT FOR SERUM ALCOHOL IS 11 mg/dL FOR MEDICAL PURPOSES ONLY   Salicylate level     Status: None   Collection Time: 08/02/14  1:15 AM  Result Value Ref Range   Salicylate Lvl <4.4 2.8 - 30.0 mg/dL  Urinalysis complete, with microscopic  [facility])     Status: Abnormal   Collection Time: 08/02/14  1:34 AM  Result Value Ref Range   Color, Urine YELLOW (A) YELLOW   APPearance CLEAR (A) CLEAR   Glucose, UA 50 (A) NEGATIVE mg/dL   Bilirubin Urine NEGATIVE NEGATIVE   Ketones, ur NEGATIVE NEGATIVE mg/dL   Specific Gravity, Urine 1.000 (L) 1.005 - 1.030   Hgb urine dipstick NEGATIVE NEGATIVE   pH 5.0 5.0 - 8.0  Protein, ur NEGATIVE NEGATIVE mg/dL   Nitrite NEGATIVE NEGATIVE   Leukocytes, UA NEGATIVE NEGATIVE   RBC / HPF NONE SEEN 0 - 5 RBC/hpf   WBC, UA NONE SEEN 0 - 5 WBC/hpf   Bacteria, UA NONE SEEN NONE SEEN   Squamous Epithelial / LPF NONE SEEN NONE SEEN   Amorphous Crystal PRESENT   Urine Drug Screen, [facility])     Status: None   Collection Time: 08/02/14  1:34 AM  Result Value Ref Range   Tricyclic, Ur Screen NONE DETECTED NONE DETECTED   Amphetamines, Ur Screen NONE DETECTED NONE DETECTED   MDMA (Ecstasy)Ur Screen NONE DETECTED NONE DETECTED   Cocaine Metabolite,Ur [place] NONE DETECTED NONE DETECTED   Opiate, Ur Screen NONE DETECTED NONE DETECTED   Phencyclidine (PCP) Ur S NONE DETECTED NONE DETECTED   Cannabinoid 50 Ng, Ur [place] NONE DETECTED NONE DETECTED   Barbiturates, Ur Screen NONE DETECTED NONE DETECTED   Benzodiazepine, Ur Scrn NONE DETECTED NONE DETECTED   Methadone Scn, Ur NONE DETECTED NONE DETECTED    Comment: (NOTE) 361  Tricyclics, urine               Cutoff 1000 ng/mL 200  Amphetamines, urine             Cutoff 1000 ng/mL 300  MDMA (Ecstasy), urine           Cutoff 500 ng/mL 400  Cocaine Metabolite, urine       Cutoff 300 ng/mL 500  Opiate, urine                   Cutoff 300 ng/mL 600  Phencyclidine (PCP), urine      Cutoff 25 ng/mL 700  Cannabinoid, urine              Cutoff 50 ng/mL 800  Barbiturates, urine             Cutoff 200 ng/mL 900  Benzodiazepine, urine           Cutoff 200 ng/mL 1000 Methadone, urine                Cutoff 300 ng/mL 1100 1200 The urine drug screen provides only a  preliminary, unconfirmed 1300 analytical test result and should not be used for non-medical 1400 purposes. Clinical consideration and professional judgment should 1500 be applied to any positive drug screen result due to possible 1600 interfering substances. A more specific alternate chemical method 1700 must be used in order to obtain a confirmed analytical result.  1800 Gas chromato graphy / mass spectrometry (GC/MS) is the preferred 1900 confirmatory method.     Vitals: Blood pressure 111/72, pulse 93, temperature 98.1 F (36.7 C), temperature source Oral, resp. rate 17, height 6' (1.829 m), weight 127.914 kg (282 lb), SpO2 98 %.  Risk to Self: Suicidal Ideation: No Suicidal Intent: No Is patient at risk for suicide?: No Suicidal Plan?: No Access to Means: No What has been your use of drugs/alcohol within the last 12 months?: none How many times?: 0 Other Self Harm Risks: none Triggers for Past Attempts: None known Intentional Self Injurious Behavior: None Risk to Others: Homicidal Ideation: No Thoughts of Harm to Others: No Current Homicidal Intent: No Current Homicidal Plan: No Access to Homicidal Means: No Identified Victim: none History of harm to others?: No Assessment of Violence: On admission Violent Behavior Description: none Does patient have access to weapons?: No Criminal Charges Pending?: No Does patient have a court date:  No Prior Inpatient Therapy: Prior Inpatient Therapy: Yes Prior Therapy Dates: 11/28/2011--[facility] Prior Therapy Facilty/Provider([facility]): [facility] Reason for Treatment: Depression Prior Outpatient Therapy: Prior Outpatient Therapy: Yes Prior Therapy Dates: quarterly Prior Therapy Facilty/Provider(s): [facility] Reason for Treatment: depression Does patient have an ACCT team?: No Does patient have Intensive In-House Services?  : No Does patient have [facility] services? : No Does patient have P4CC services?: No  Current Facility-Administered Medications   Medication Dose Route Frequency Provider Last Rate Last Dose  . oxyCODONE-acetaminophen (PERCOCET/ROXICET) 5-325 MG per tablet 2 tablet  2 tablet Oral Q6H PRN Harvest Dark, MD   2 tablet at 08/02/14 1637   Current Outpatient Prescriptions  Medication Sig Dispense Refill  . amitriptyline (ELAVIL) 150 MG tablet Take 150 mg by mouth at bedtime.    Marland Kitchen escitalopram (LEXAPRO) 20 MG tablet Take 20 mg by mouth daily.    Marland Kitchen gabapentin (NEURONTIN) 600 MG tablet Take 600 mg by mouth 3 (three) times daily.    Marland Kitchen ibuprofen (ADVIL,MOTRIN) 800 MG tablet Take 800 mg by mouth every 8 (eight) hours as needed for moderate pain.     Marland Kitchen lisinopril-hydrochlorothiazide (PRINZIDE,ZESTORETIC) 10-12.5 MG per tablet Take 1 tablet by mouth daily.    Marland Kitchen omeprazole (PRILOSEC) 40 MG capsule Take 40 mg by mouth daily.    Marland Kitchen oxyCODONE-acetaminophen (PERCOCET) 10-325 MG per tablet Take 1 tablet by mouth 4 (four) times daily.    . traZODone (DESYREL) 150 MG tablet Take 150 mg by mouth at bedtime.      Musculoskeletal: Strength & Muscle Tone: within normal limits Gait & Station: normal Patient leans: N/A  Psychiatric Specialty Exam: Physical Exam  Constitutional: He appears well-developed and well-nourished.  HENT:  Head: Normocephalic and atraumatic.  Eyes: Conjunctivae are normal. Pupils are equal, round, and reactive to light.  Neck: Normal range of motion.  Cardiovascular: Normal heart sounds.   Respiratory: Effort normal.  GI: Soft.  Musculoskeletal: Normal range of motion.  Neurological: He is alert.  Skin: Skin is warm and dry.  Psychiatric: His speech is normal and behavior is normal. Judgment and thought content normal. His mood appears anxious. Cognition and memory are normal. He exhibits a depressed mood.  Patient is disheveled and appears a little bit anxious. At times he is been demanding of staff. In interview there is no sign of psychosis and he denies any suicidality. Appears to be cognitively intact.     Review of Systems  Constitutional: Negative.   HENT: Negative.   Eyes: Negative.   Respiratory: Negative.   Cardiovascular: Negative.   Gastrointestinal: Negative.   Musculoskeletal: Negative.   Skin: Negative.   Neurological: Negative.   Psychiatric/Behavioral: Positive for depression. Negative for suicidal ideas, hallucinations, memory loss and substance abuse. The patient is nervous/anxious and has insomnia.     Blood pressure 111/72, pulse 93, temperature 98.1 F (36.7 C), temperature source Oral, resp. rate 17, height 6' (1.829 m), weight 127.914 kg (282 lb), SpO2 98 %.Body mass index is 38.24 kg/(m^2).  General Appearance: Disheveled  Eye Contact::  Good  Speech:  Normal Rate  Volume:  Normal  Mood:  Depressed  Affect:  Blunt  Thought Process:  Goal Directed  Orientation:  Full (Time, Place, and Person)  Thought Content:  Negative  Suicidal Thoughts:  No  Homicidal Thoughts:  No  Memory:  Immediate;   Good Recent;   Good Remote;   Fair  Judgement:  Fair  Insight:  Fair  Psychomotor Activity:  Negative  Concentration:  Good  Recall:  Smiley Houseman of Knowledge:Good  Language: Good  Akathisia:  No  Handed:  Right  AIMS (if indicated):     Assets:  Desire for Improvement Housing  ADL's:  Intact  Cognition: WNL  Sleep:      Medical Decision Making: Review of Psycho-Social Stressors (1), Review and summation of old records (2), Established Problem, Worsening (2), Review or order medicine tests (1), Review of Medication Regimen & Side Effects (2) and Review of New Medication or Change in Dosage (2)  Treatment Plan Summary: Medication management and Plan Patient is not reporting suicidal ideation or homicidal ideation and is not psychotic. He has a place to live and has appropriate outpatient treatment. He does not require inpatient psychiatric hospitalization. He is interested in seeing if we can do something more for his depression. I propose adding Abilify 5 mg per  day to his medicines. Discussed side effects including akathisia or EPS and diabetes. Patient is agreeable to the plan. Continue other medication. Supportive counseling and strongly encouraged him to take advantage of the community support team from [facility] and start going there for counseling and therapy as well as medication management. Patient will be taken off commitment if he is on it and he can be released from the emergency room and discharged at the discretion of the emergency room physician.  Plan:  No evidence of imminent risk to self or others at present.   Patient does not meet criteria for psychiatric inpatient admission. Discussed crisis plan, support from social network, calling 911, coming to the Emergency Department, and calling Suicide Hotline. Disposition: Patient may be discharged from the emergency room with a new prescription at the discretion of the emergency room physician.  Teniola Tseng 08/02/2014 7:08 PM

## 2014-08-02 NOTE — ED Notes (Signed)
Report received from end-shift RN. Patient care assumed. Patient/RN introduction complete. Will continue to monitor.  

## 2014-08-02 NOTE — ED Notes (Signed)
BEHAVIORAL HEALTH ROUNDING Patient sleeping: Yes.   Patient alert and oriented: not applicable Behavior appropriate: Yes.  ; If no, describe:  Nutrition and fluids offered: No Toileting and hygiene offered: No Sitter present: yes Law enforcement present: Yes   

## 2014-08-02 NOTE — ED Notes (Signed)
Report called to [facility]Luis [facility]

## 2014-08-02 NOTE — ED Notes (Signed)

## 2014-08-02 NOTE — ED Notes (Signed)
BEHAVIORAL HEALTH ROUNDING Patient sleeping: Yes.   Patient alert and oriented: not applicable Behavior appropriate: Yes.  ; If no, describe:  Nutrition and fluids offered: No Toileting and hygiene offered: No Sitter present: yes Law enforcement present: Yes   ENVIRONMENTAL ASSESSMENT Potentially harmful objects out of patient reach: Yes.   Personal belongings secured: Yes.   Patient dressed in hospital provided attire only: Yes.   Plastic bags out of patient reach: Yes.   Patient care equipment (cords, cables, call bells, lines, and drains) shortened, removed, or accounted for: Yes.   Equipment and supplies removed from bottom of stretcher: Yes.   Potentially toxic materials out of patient reach: Yes.   Sharps container removed or out of patient reach: Yes.    

## 2014-08-02 NOTE — ED Notes (Signed)
ED BHU PLACEMENT JUSTIFICATION Is the patient under IVC or is there intent for IVC: No. Is the patient medically cleared: Yes.   Is there vacancy in the ED BHU: Yes.   Is the population mix appropriate for patient: Yes.   Is the patient awaiting placement in inpatient or outpatient setting: Yes.   Has the patient had a psychiatric consult: Yes.   Survey of unit performed for contraband, proper placement and condition of furniture, tampering with fixtures in bathroom, shower, and each patient room: Yes.  ; Findings:  APPEARANCE/BEHAVIOR calm, cooperative and adequate rapport can be established NEURO ASSESSMENT Orientation: time, place and person Hallucinations: No.None noted (Hallucinations) Speech: Normal Gait: normal RESPIRATORY ASSESSMENT Normal expansion.  Clear to auscultation.  No rales, rhonchi, or wheezing. CARDIOVASCULAR ASSESSMENT regular rate and rhythm, S1, S2 normal, no murmur, click, rub or gallop GASTROINTESTINAL ASSESSMENT soft, nontender, BS WNL, no r/g EXTREMITIES normal strength, tone, and muscle mass PLAN OF CARE Provide calm/safe environment. Vital signs assessed twice daily. ED BHU Assessment once each 12-hour shift. Collaborate with intake RN daily or as condition indicates. Assure the ED provider has rounded once each shift. Provide and encourage hygiene. Provide redirection as needed. Assess for escalating behavior; address immediately and inform ED provider.  Assess family dynamic and appropriateness for visitation as needed: Yes.  ; If necessary, describe findings:  Educate the patient/family about BHU procedures/visitation: Yes.  ; If necessary, describe findings:  

## 2014-08-02 NOTE — Discharge Instructions (Signed)
Depression °Depression refers to feeling sad, low, down in the dumps, blue, gloomy, or empty. In general, there are two kinds of depression: °1. Normal sadness or normal grief. This kind of depression is one that we all feel from time to time after upsetting life experiences, such as the loss of a job or the ending of a relationship. This kind of depression is considered normal, is short lived, and resolves within a few days to 2 weeks. Depression experienced after the loss of a loved one (bereavement) often lasts longer than 2 weeks but normally gets better with time. °2. Clinical depression. This kind of depression lasts longer than normal sadness or normal grief or interferes with your ability to function at home, at work, and in school. It also interferes with your personal relationships. It affects almost every aspect of your life. Clinical depression is an illness. °Symptoms of depression can also be caused by conditions other than those mentioned above, such as: °· Physical illness. Some physical illnesses, including underactive thyroid gland (hypothyroidism), severe anemia, specific types of cancer, diabetes, uncontrolled seizures, heart and lung problems, strokes, and chronic pain are commonly associated with symptoms of depression. °· Side effects of some prescription medicine. In some people, certain types of medicine can cause symptoms of depression. °· Substance abuse. Abuse of alcohol and illicit drugs can cause symptoms of depression. °SYMPTOMS °Symptoms of normal sadness and normal grief include the following: °· Feeling sad or crying for short periods of time. °· Not caring about anything (apathy). °· Difficulty sleeping or sleeping too much. °· No longer able to enjoy the things you used to enjoy. °· Desire to be by oneself all the time (social isolation). °· Lack of energy or motivation. °· Difficulty concentrating or remembering. °· Change in appetite or weight. °· Restlessness or  agitation. °Symptoms of clinical depression include the same symptoms of normal sadness or normal grief and also the following symptoms: °· Feeling sad or crying all the time. °· Feelings of guilt or worthlessness. °· Feelings of hopelessness or helplessness. °· Thoughts of suicide or the desire to harm yourself (suicidal ideation). °· Loss of touch with reality (psychotic symptoms). Seeing or hearing things that are not real (hallucinations) or having false beliefs about your life or the people around you (delusions and paranoia). °DIAGNOSIS  °The diagnosis of clinical depression is usually based on how bad the symptoms are and how long they have lasted. Your health care provider will also ask you questions about your medical history and substance use to find out if physical illness, use of prescription medicine, or substance abuse is causing your depression. Your health care provider may also order blood tests. °TREATMENT  °Often, normal sadness and normal grief do not require treatment. However, sometimes antidepressant medicine is given for bereavement to ease the depressive symptoms until they resolve. °The treatment for clinical depression depends on how bad the symptoms are but often includes antidepressant medicine, counseling with a mental health professional, or both. Your health care provider will help to determine what treatment is best for you. °Depression caused by physical illness usually goes away with appropriate medical treatment of the illness. If prescription medicine is causing depression, talk with your health care provider about stopping the medicine, decreasing the dose, or changing to another medicine. °Depression caused by the abuse of alcohol or illicit drugs goes away when you stop using these substances. Some adults need professional help in order to stop drinking or using drugs. °SEEK IMMEDIATE MEDICAL   CARE IF:  You have thoughts about hurting yourself or others.  You lose touch  with reality (have psychotic symptoms).  You are taking medicine for depression and have a serious side effect. FOR MORE INFORMATION  National Alliance on Mental Illness: www.nami.[employer]  National Institute of Mental Health: http://www.maynard.net/www.nimh.nih.gov Document Released: 02/13/2000 Document Revised: 07/02/2013 Document Reviewed: 05/17/2011 [facility] Patient Information 2015 [place], [place]. This information is not intended to replace advice given to you by your health care provider. Make sure you discuss any questions you have with your health care provider.     Please follow-up with [facility] and take your Abilify as prescribed. As he have discussed with psychiatry please follow-up with a behavioral therapist. Return to the emergency department if you're having any thoughts of hurting herself or anyone else at that we may attempt to help you.

## 2014-08-02 NOTE — BH Assessment (Signed)
Assessment Note  Daniel [name] is an 49 y.o. 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 Hypertension    Axis IV: problems with access to health care services and problems with primary support group Axis V: 41-50 serious symptoms  Past Medical History:  Past Medical History  Diagnosis Date  . Hypertension     Past Surgical History  Procedure Laterality Date  . Knee surgery Left     Family History: No family history on file.  Social History:  reports that he has quit smoking. He does not have any smokeless tobacco history on file. He reports that he does not drink alcohol or use illicit drugs.  Additional Social History:     CIWA: CIWA-Ar BP: (!) 113/91 mmHg Pulse Rate: (!) 111 COWS:    Allergies:  Allergies  Allergen Reactions  . Hydrocodone-Acetaminophen Hives    Vicodin/Norco    Home Medications:  (Not in a hospital admission)  OB/GYN Status:  No LMP for male patient.  General Assessment Data [facility] Assessment: In system Is this a Tele or Face-to-Face Assessment?: Face-to-Face Is this an Initial Assessment or a Re-assessment for this encounter?: Initial Assessment Marital status: Single Maiden name: none Is patient pregnant?: No Pregnancy Status: No Living Arrangements:  Alone Can pt return to current living arrangement?: Yes Admission Status: Voluntary Is patient capable of signing voluntary admission?: Yes Referral Source: Self/Family/Friend Insurance type: [place]  Medical Screening Exam [facility] Walk-in ONLY) Medical Exam completed: Yes  Crisis Care Plan Living Arrangements: Alone Name of Psychiatrist: Dr. Suzie PortelaMoffitt @ [facility] Name of Therapist: none  Education Status Is patient currently in school?: No Current Grade: n/a Highest grade of school patient has completed: 12th Name of school: n/a Contact person: none  Risk to self with the past 6 months Suicidal Ideation: No Has patient been a risk to self within the past 6 months prior to admission? : No Suicidal Intent: No Has patient had any suicidal intent within the past 6 months prior to admission? : No Is patient at risk for suicide?: No Suicidal Plan?: No Has patient had any suicidal plan within the past 6 months prior to admission? : No Access to Means: No What has been your use of drugs/alcohol within the last 12 months?: none Previous Attempts/Gestures: No How many times?: 0 Other Self Harm Risks: none Triggers for Past Attempts: None known Intentional Self Injurious Behavior: None Family Suicide History: No Recent stressful life event(s): Other (Comment) (no supports) Persecutory voices/beliefs?: No Depression: Yes Depression Symptoms: Despondent, Feeling worthless/self pity Substance abuse history and/or treatment for substance abuse?: No Suicide prevention information given to non-admitted patients: Yes  Risk to Others within the past 6 months Homicidal Ideation: No  Does patient have any lifetime risk of violence toward others beyond the six months prior to admission? : No Thoughts of Harm to Others: No Current Homicidal Intent: No Current Homicidal Plan: No Access to Homicidal Means: No Identified Victim: none History of harm to others?: No Assessment of Violence: On  admission Violent Behavior Description: none Does patient have access to weapons?: No Criminal Charges Pending?: No Does patient have a court date: No Is patient on probation?: No  Psychosis Hallucinations: None noted Delusions: None noted  Mental Status Report Appearance/Hygiene: Unremarkable Eye Contact: Good Motor Activity: Restlessness Speech: Logical/coherent Level of Consciousness: Alert, Quiet/awake Mood: Depressed, Sad Affect: Sad, Depressed Anxiety Level: Moderate Thought Processes: Coherent, Circumstantial Judgement: Partial Orientation: Person, Place, Time, Situation Obsessive Compulsive Thoughts/Behaviors: None  Cognitive Functioning Concentration: Good Memory: Recent Intact, Remote Intact IQ: Average Insight: Fair Impulse Control: Fair Appetite: Good Weight Loss: 0 Weight Gain: 0 Sleep: No Change Total Hours of Sleep: 5 Vegetative Symptoms: None  ADLScreening [facility]) Patient's cognitive ability adequate to safely complete daily activities?: Yes Patient able to express need for assistance with ADLs?: Yes Independently performs ADLs?: Yes (appropriate for developmental age)  Prior Inpatient Therapy Prior Inpatient Therapy: Yes Prior Therapy Dates: 11/28/2011--[facility] Prior Therapy Facilty/Provider(s): [facility] Reason for Treatment: Depression  Prior Outpatient Therapy Prior Outpatient Therapy: Yes Prior Therapy Dates: quarterly Prior Therapy Facilty/Provider(s): [facility] Reason for Treatment: depression Does patient have an ACCT team?: No Does patient have Intensive In-House Services?  : No Does patient have [facility] services? : No Does patient have P4CC services?: No  ADL Screening (condition at time of admission) Patient's cognitive ability adequate to safely complete daily activities?: Yes Patient able to express need for assistance with ADLs?: Yes Independently performs ADLs?: Yes (appropriate for developmental age)        Abuse/Neglect Assessment (Assessment to be complete while patient is alone) Physical Abuse: Denies Verbal Abuse: Denies Sexual Abuse: Denies Exploitation of patient/patient's resources: Denies Self-Neglect: Denies Values / Beliefs Spiritual Requests During Hospitalization: None Consults Social Work Consult Needed: No Merchant navy officer (For Healthcare) Does patient have an advance directive?: No Would patient like information on creating an advanced directive?: No - patient declined information    Additional Information 1:1 In Past 12 Months?: No CIRT Risk: No Elopement Risk: No Does patient have medical clearance?: Yes  Child/Adolescent Assessment Running Away Risk: Denies Bed-Wetting: Denies Destruction of Property: Denies Cruelty to Animals: Denies Stealing: Denies Rebellious/Defies Authority: Denies Satanic Involvement: Denies Archivist: Denies Problems at [employer]: Denies Gang Involvement: Denies  Disposition:  Disposition Initial Assessment Completed for this Encounter: Yes Disposition of Patient: Referred to, Other dispositions Other disposition(s): Other (Comment) (Psych MD to see) Patient referred to: Other (Comment) (Psy MD)  On Site Evaluation by:   Reviewed with Physician:    Dwan Bolt 08/02/2014 6:50 AM

## 2014-08-02 NOTE — ED Notes (Signed)
Pt given snack tray. 
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
## 2014-08-02 NOTE — ED Notes (Signed)
BEHAVIORAL HEALTH ROUNDING  Patient sleeping: Yes  Patient alert and oriented: Sleeping  Behavior appropriate: Yes. ; If no, describe:  Nutrition and fluids offered: Sleeping  Toileting and hygiene offered: Sleeping  Sitter present: q15 minute observations and security camera monitoring  Law enforcement present: Yes ODS  

## 2014-08-02 NOTE — ED Notes (Signed)
Patient discharged to home per MD order. Patient in stable condition, and deemed medically cleared by ED provider for discharge. Discharge instructions reviewed with patient/family using "Teach Back"; verbalized understanding of medication education and administration, and information about follow-up care. Denies further concerns. ° °

## 2014-08-25 IMAGING — US US PELVIS LIMITED
1 series · 14 of 25 positions shown · non-contrast
Comparison: none

REASON FOR EXAM: PAINFUL AND SWOLLEN RIGHT TESTICLE
COMMENTS:

[Series 1: us pelvis limited · 0.13mm/px · 14 of 72 slices shown]
[im 1/72]
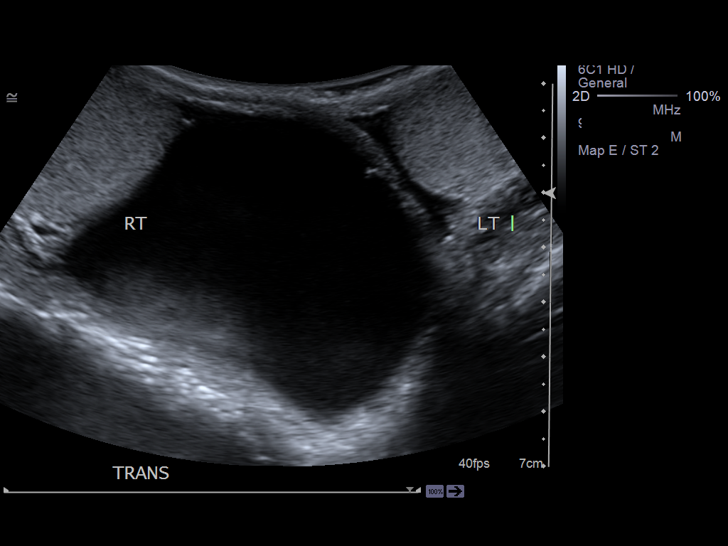
[im 6/72]
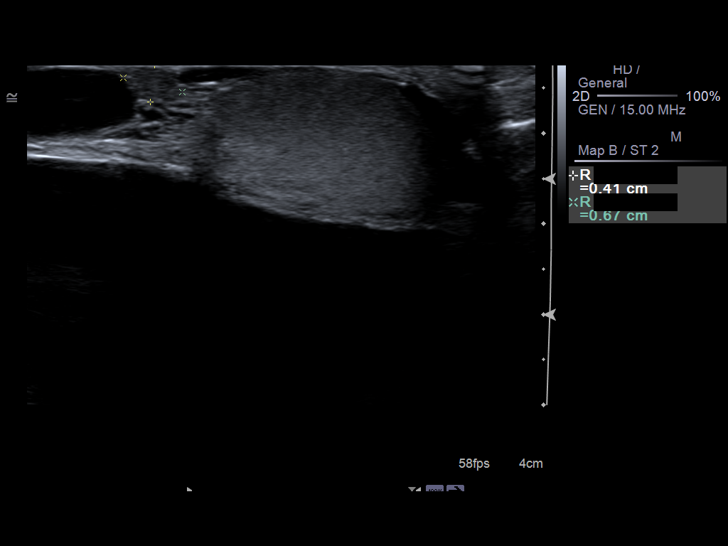
[im 12/72]
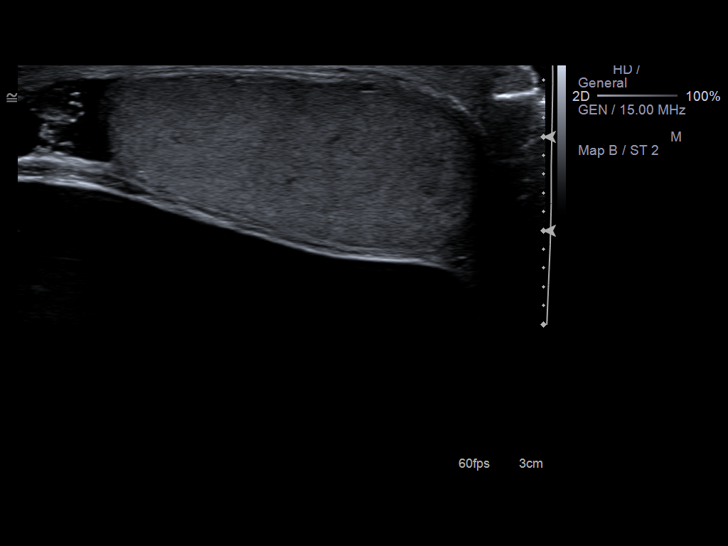
[im 18/72]
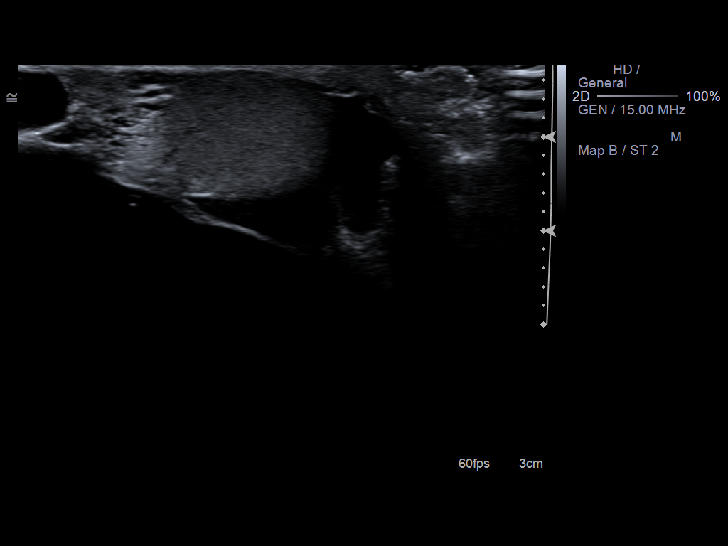
[im 24/72]
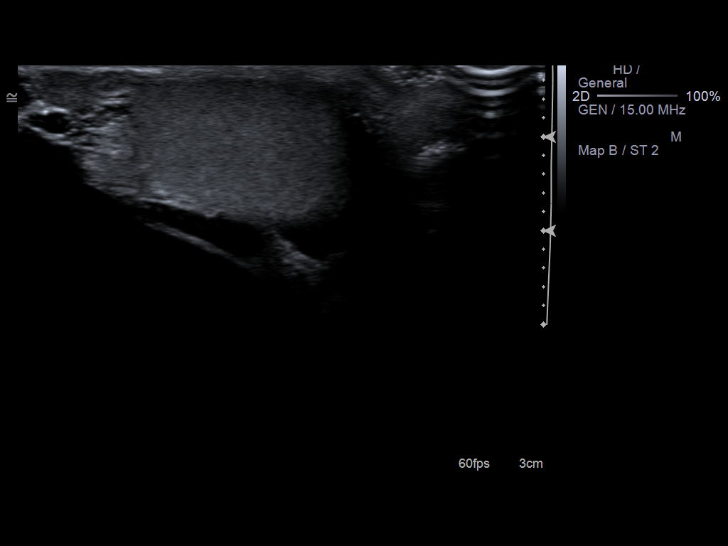
[im 27/72]
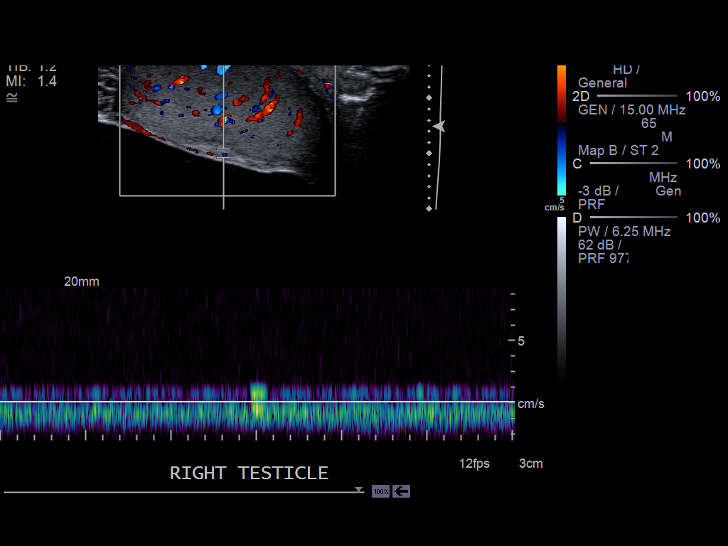
[im 33/72]
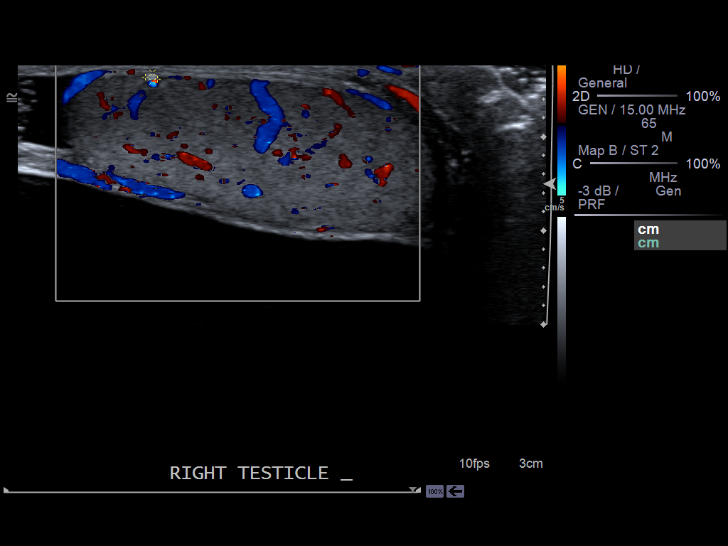
[im 39/72]
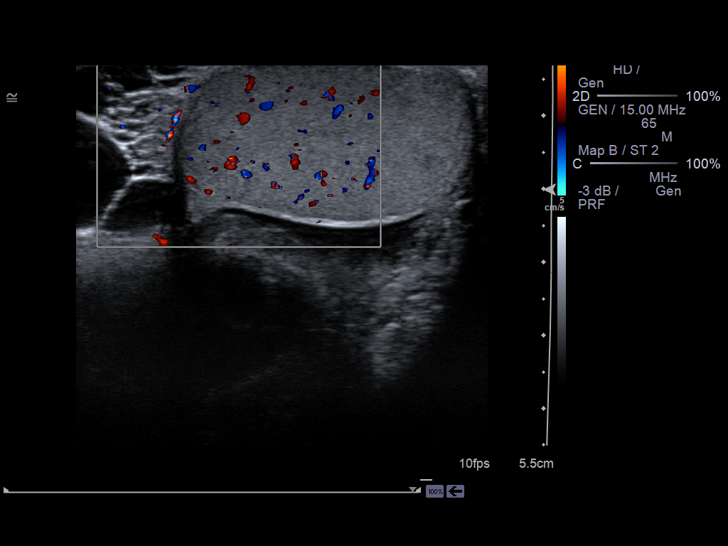
[im 45/72]
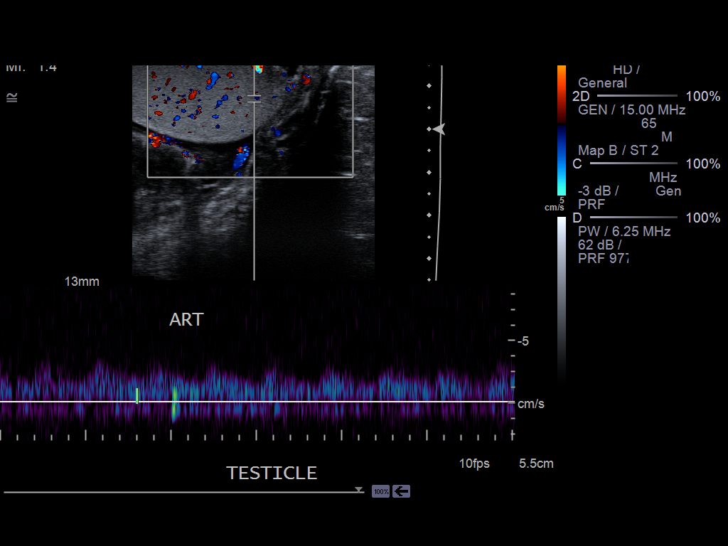
[im 48/72]
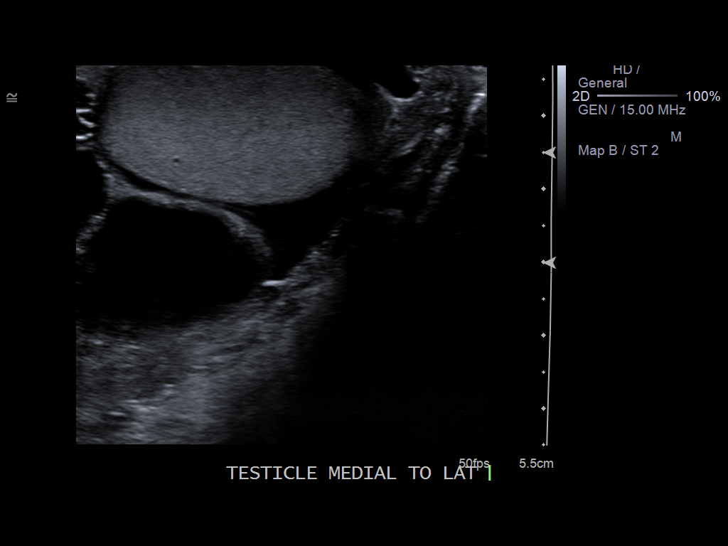
[im 54/72]
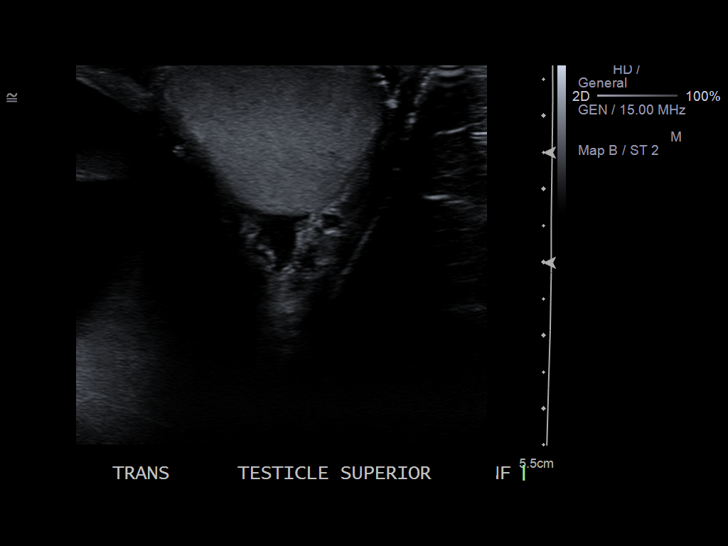
[im 60/72]
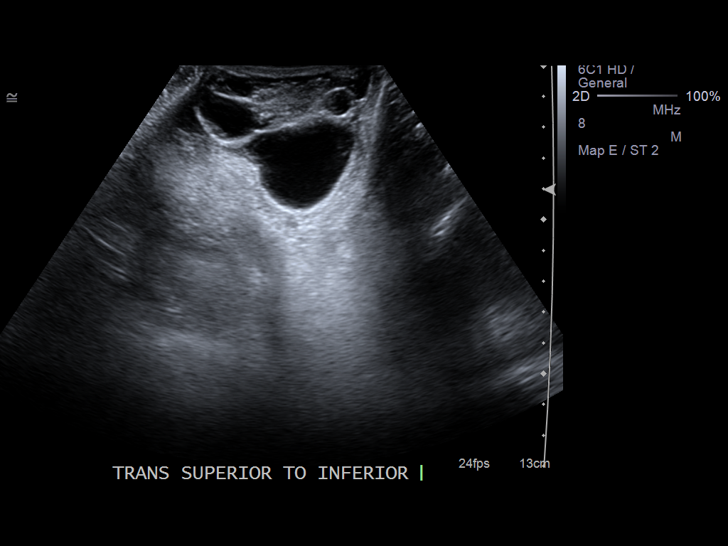
[im 66/72]
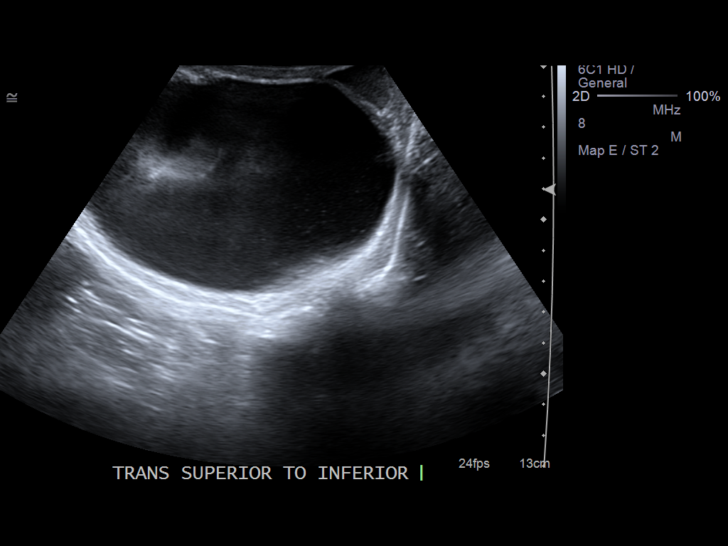
[im 72/72]
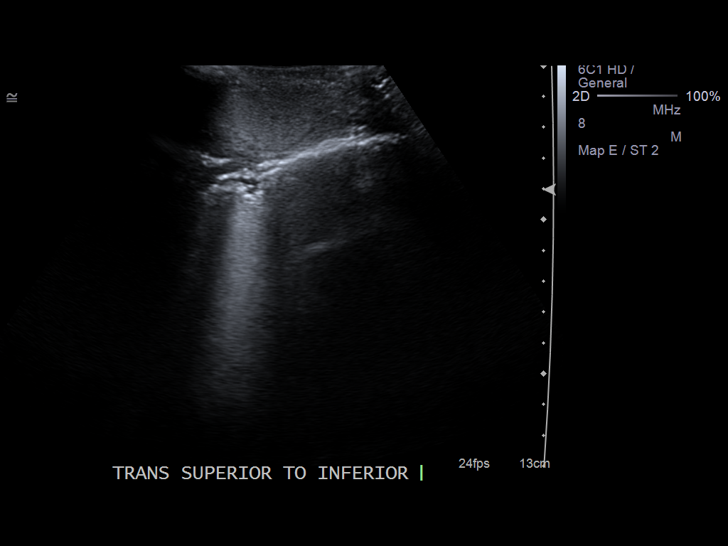

[14 of 25 positions shown; findings below may reference images not displayed]

PROCEDURE:     US  - US TESTICULAR  - January 02, 2012  [DATE]

RESULT:     Grayscale and Doppler ultrasound of the scrotum was performed.
The testes are normal in echotexture and contour. There are no
intratesticular masses. The right testicle measures 4 x 1.6 x
centimeters. The left testicle measures 4.1 x 2.2 x 2.7 cm. Vascularity of
both testes is normal.

The epididymis on the left was not demonstrated. On the right appear to be
normal in echotexture and contour vascularity measuring 1.3 by 0. X 0.7 cm
the

There are large bilateral hydroceles. On the right this measures 9.2 x 7.9 x
9.8 cm. There are septations present within this fluid collection. The
hydrocele on the left measures 5.4 x 3.9 x 5.6 cm. No varicocele is
demonstrated.
IMPRESSION: 1. There is no evidence of testicular torsion nor of intratesticular masses.
2. There is no evidence of epididymitis or vascular abnormalities of the
right epididymis. The left epididymis was not demonstrated.
2. There are large bilateral septated hydroceles.

A preliminary report was sent to the [HOSPITAL] the conclusion
of the study.

[REDACTED]

## 2015-02-16 ENCOUNTER — Emergency Department
Admission: EM | Admit: 2015-02-16 | Discharge: 2015-02-16 | Disposition: A | Discharge status: Home / Self Care | Payer: Medicaid Other | Attending: Emergency Medicine | Admitting: Emergency Medicine

## 2015-02-16 ENCOUNTER — Emergency Department: Payer: Medicaid Other

## 2015-02-16 ENCOUNTER — Encounter: Payer: Self-pay | Admitting: Emergency Medicine

## 2015-02-16 DIAGNOSIS — G8929 Other chronic pain: Secondary | ICD-10-CM | POA: Diagnosis not present

## 2015-02-16 DIAGNOSIS — M5440 Lumbago with sciatica, unspecified side: Secondary | ICD-10-CM | POA: Diagnosis not present

## 2015-02-16 DIAGNOSIS — R110 Nausea: Secondary | ICD-10-CM | POA: Diagnosis not present

## 2015-02-16 DIAGNOSIS — I10 Essential (primary) hypertension: Secondary | ICD-10-CM | POA: Diagnosis not present

## 2015-02-16 DIAGNOSIS — Z79899 Other long term (current) drug therapy: Secondary | ICD-10-CM | POA: Insufficient documentation

## 2015-02-16 DIAGNOSIS — R1084 Generalized abdominal pain: Secondary | ICD-10-CM | POA: Diagnosis not present

## 2015-02-16 DIAGNOSIS — Z87891 Personal history of nicotine dependence: Secondary | ICD-10-CM | POA: Insufficient documentation

## 2015-02-16 DIAGNOSIS — M545 Low back pain: Secondary | ICD-10-CM | POA: Diagnosis present

## 2015-02-16 HISTORY — DX: Other chronic pain: G89.29

## 2015-02-16 HISTORY — DX: Anxiety disorder, unspecified: F41.9

## 2015-02-16 LAB — COMPREHENSIVE METABOLIC PANEL
ALBUMIN: 4.4 g/dL (ref 3.5–5.0)
ALK PHOS: 61 U/L (ref 38–126)
ALT: 24 U/L (ref 17–63)
ANION GAP: 10 (ref 5–15)
AST: 23 U/L (ref 15–41)
BILIRUBIN TOTAL: 0.6 mg/dL (ref 0.3–1.2)
BUN: 10 mg/dL (ref 6–20)
CALCIUM: 9.5 mg/dL (ref 8.9–10.3)
CO2: 19 mmol/L — ABNORMAL LOW (ref 22–32)
Chloride: 108 mmol/L (ref 101–111)
Creatinine, Ser: 1.04 mg/dL (ref 0.61–1.24)
GFR calc Af Amer: >60 mL/min (ref >60)
GFR calc non Af Amer: >60 mL/min (ref >60)
GLUCOSE: 87 mg/dL (ref 65–99)
Potassium: 3.4 mmol/L — ABNORMAL LOW (ref 3.5–5.1)
Sodium: 137 mmol/L (ref 135–145)
TOTAL PROTEIN: 8.1 g/dL (ref 6.5–8.1)

## 2015-02-16 LAB — CBC
HCT: 45.5 % (ref 40.0–52.0)
HEMOGLOBIN: 15.1 g/dL (ref 13.0–18.0)
MCH: 30 pg (ref 26.0–34.0)
MCHC: 33.2 g/dL (ref 32.0–36.0)
MCV: 90.4 fL (ref 80.0–100.0)
Platelets: 249 10*3/uL (ref 150–440)
RBC: 5.04 MIL/uL (ref 4.40–5.90)
RDW: 13.9 % (ref 11.5–14.5)
WBC: 7.5 10*3/uL (ref 3.8–10.6)

## 2015-02-16 LAB — MAGNESIUM: Magnesium: 1.9 mg/dL (ref 1.7–2.4)

## 2015-02-16 LAB — TROPONIN I

## 2015-02-16 LAB — LIPASE, BLOOD: LIPASE: 17 U/L (ref 11–51)

## 2015-02-16 MED ORDER — IOHEXOL 240 MG/ML SOLN
25.0000 mL | Freq: Once | INTRAMUSCULAR | Status: AC | PRN
  Administered 2015-02-16: 25 mL via ORAL

## 2015-02-16 MED ORDER — MORPHINE SULFATE (PF) 4 MG/ML IV SOLN
4.0000 mg | Freq: Once | INTRAVENOUS | Status: AC
  Administered 2015-02-16: 4 mg via INTRAVENOUS
  Filled 2015-02-16: qty 1

## 2015-02-16 MED ORDER — OXYCODONE-ACETAMINOPHEN 10-325 MG PO TABS: 1.0000 | ORAL_TABLET | Freq: Four times a day (QID) | ORAL | Status: DC | PRN

## 2015-02-16 MED ORDER — SODIUM CHLORIDE 0.9 % IV BOLUS (SEPSIS): 1000.0000 mL | Freq: Once | INTRAVENOUS | Status: DC

## 2015-02-16 MED ORDER — IOHEXOL 300 MG/ML  SOLN
100.0000 mL | Freq: Once | INTRAMUSCULAR | Status: AC | PRN
  Administered 2015-02-16: 100 mL via INTRAVENOUS

## 2015-02-16 MED ORDER — ONDANSETRON HCL 4 MG/2ML IJ SOLN
4.0000 mg | Freq: Once | INTRAMUSCULAR | Status: AC
  Administered 2015-02-16: 4 mg via INTRAVENOUS
  Filled 2015-02-16: qty 2

## 2015-02-16 MED ORDER — SODIUM CHLORIDE 0.9 % IV BOLUS (SEPSIS)
1000.0000 mL | Freq: Once | INTRAVENOUS | Status: AC
Start: 2015-02-16 — End: 2015-02-16
  Administered 2015-02-16: 1000 mL via INTRAVENOUS

## 2015-02-16 MED ORDER — KETOROLAC TROMETHAMINE 30 MG/ML IJ SOLN
30.0000 mg | Freq: Once | INTRAMUSCULAR | Status: AC
  Administered 2015-02-16: 30 mg via INTRAVENOUS
  Filled 2015-02-16: qty 1

## 2015-02-16 NOTE — ED Notes (Signed)
RN called lab to report added blood work.
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
## 2015-02-16 NOTE — ED Notes (Signed)
Reports continued pain.  MD notified.
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
## 2015-02-24 ENCOUNTER — Encounter: Payer: Self-pay | Admitting: Psychiatry

## 2015-02-24 ENCOUNTER — Encounter: Payer: Self-pay | Admitting: Emergency Medicine

## 2015-02-24 ENCOUNTER — Inpatient Hospital Stay
Admission: EM | Admit: 2015-02-24 | Discharge: 2015-02-28 | DRG: 885 | Disposition: A | Discharge status: Home / Self Care | Payer: Medicaid Other | Source: Intra-hospital | Attending: Psychiatry | Admitting: Psychiatry

## 2015-02-24 ENCOUNTER — Emergency Department
Admission: EM | Admit: 2015-02-24 | Discharge: 2015-02-24 | Disposition: A | Discharge status: Other Acute Inpatient Hospital | Payer: Medicaid Other | Attending: Emergency Medicine | Admitting: Emergency Medicine

## 2015-02-24 ENCOUNTER — Emergency Department: Payer: Medicaid Other

## 2015-02-24 DIAGNOSIS — F419 Anxiety disorder, unspecified: Secondary | ICD-10-CM | POA: Diagnosis present

## 2015-02-24 DIAGNOSIS — Z87891 Personal history of nicotine dependence: Secondary | ICD-10-CM | POA: Insufficient documentation

## 2015-02-24 DIAGNOSIS — F332 Major depressive disorder, recurrent severe without psychotic features: Secondary | ICD-10-CM | POA: Diagnosis present

## 2015-02-24 DIAGNOSIS — Z9889 Other specified postprocedural states: Secondary | ICD-10-CM

## 2015-02-24 DIAGNOSIS — Z79899 Other long term (current) drug therapy: Secondary | ICD-10-CM | POA: Diagnosis not present

## 2015-02-24 DIAGNOSIS — K219 Gastro-esophageal reflux disease without esophagitis: Secondary | ICD-10-CM | POA: Diagnosis present

## 2015-02-24 DIAGNOSIS — R45851 Suicidal ideations: Secondary | ICD-10-CM | POA: Diagnosis present

## 2015-02-24 DIAGNOSIS — R059 Cough, unspecified: Secondary | ICD-10-CM

## 2015-02-24 DIAGNOSIS — I10 Essential (primary) hypertension: Secondary | ICD-10-CM | POA: Diagnosis present

## 2015-02-24 DIAGNOSIS — R45850 Homicidal ideations: Secondary | ICD-10-CM | POA: Insufficient documentation

## 2015-02-24 DIAGNOSIS — F329 Major depressive disorder, single episode, unspecified: Secondary | ICD-10-CM | POA: Diagnosis not present

## 2015-02-24 DIAGNOSIS — G8929 Other chronic pain: Secondary | ICD-10-CM | POA: Diagnosis present

## 2015-02-24 DIAGNOSIS — F1120 Opioid dependence, uncomplicated: Secondary | ICD-10-CM | POA: Diagnosis present

## 2015-02-24 DIAGNOSIS — R05 Cough: Secondary | ICD-10-CM | POA: Diagnosis present

## 2015-02-24 DIAGNOSIS — Z885 Allergy status to narcotic agent status: Secondary | ICD-10-CM | POA: Diagnosis not present

## 2015-02-24 DIAGNOSIS — M549 Dorsalgia, unspecified: Secondary | ICD-10-CM | POA: Diagnosis present

## 2015-02-24 DIAGNOSIS — Z915 Personal history of self-harm: Secondary | ICD-10-CM | POA: Diagnosis not present

## 2015-02-24 DIAGNOSIS — G4700 Insomnia, unspecified: Secondary | ICD-10-CM | POA: Diagnosis present

## 2015-02-24 LAB — COMPREHENSIVE METABOLIC PANEL
ALBUMIN: 4.4 g/dL (ref 3.5–5.0)
ALT: 30 U/L (ref 17–63)
AST: 33 U/L (ref 15–41)
Alkaline Phosphatase: 64 U/L (ref 38–126)
Anion gap: 5 (ref 5–15)
BILIRUBIN TOTAL: 0.7 mg/dL (ref 0.3–1.2)
BUN: 6 mg/dL (ref 6–20)
CO2: 26 mmol/L (ref 22–32)
CREATININE: 0.8 mg/dL (ref 0.61–1.24)
Calcium: 8.5 mg/dL — ABNORMAL LOW (ref 8.9–10.3)
Chloride: 106 mmol/L (ref 101–111)
GFR calc Af Amer: >60 mL/min (ref >60)
GFR calc non Af Amer: >60 mL/min (ref >60)
GLUCOSE: 101 mg/dL — AB (ref 65–99)
POTASSIUM: 3.3 mmol/L — AB (ref 3.5–5.1)
Sodium: 137 mmol/L (ref 135–145)
TOTAL PROTEIN: 7.3 g/dL (ref 6.5–8.1)

## 2015-02-24 LAB — URINE DRUG SCREEN, QUALITATIVE (ARMC ONLY)
Amphetamines, Ur Screen: NOT DETECTED
Barbiturates, Ur Screen: NOT DETECTED
Benzodiazepine, Ur Scrn: NOT DETECTED
CANNABINOID 50 NG, UR ~~LOC~~: NOT DETECTED
COCAINE METABOLITE, UR ~~LOC~~: NOT DETECTED
MDMA (ECSTASY) UR SCREEN: NOT DETECTED
METHADONE SCREEN, URINE: NOT DETECTED
OPIATE, UR SCREEN: POSITIVE — AB
Phencyclidine (PCP) Ur S: NOT DETECTED
TRICYCLIC, UR SCREEN: POSITIVE — AB

## 2015-02-24 LAB — ACETAMINOPHEN LEVEL: Acetaminophen (Tylenol), Serum: <10 ug/mL — ABNORMAL LOW (ref 10–30)

## 2015-02-24 LAB — CBC
HCT: 38.4 % — ABNORMAL LOW (ref 40.0–52.0)
HEMOGLOBIN: 13.2 g/dL (ref 13.0–18.0)
MCH: 31.1 pg (ref 26.0–34.0)
MCHC: 34.5 g/dL (ref 32.0–36.0)
MCV: 90 fL (ref 80.0–100.0)
Platelets: 194 10*3/uL (ref 150–440)
RBC: 4.26 MIL/uL — AB (ref 4.40–5.90)
RDW: 13.6 % (ref 11.5–14.5)
WBC: 6.6 10*3/uL (ref 3.8–10.6)

## 2015-02-24 LAB — LIPID PANEL
CHOLESTEROL: 140 mg/dL (ref 0–200)
HDL: 32 mg/dL — ABNORMAL LOW (ref >40)
LDL Cholesterol: 90 mg/dL (ref 0–99)
TRIGLYCERIDES: 89 mg/dL (ref <150)
Total CHOL/HDL Ratio: 4.4 RATIO
VLDL: 18 mg/dL (ref 0–40)

## 2015-02-24 LAB — ETHANOL: Alcohol, Ethyl (B): <5 mg/dL (ref <5)

## 2015-02-24 LAB — TSH: TSH: 0.418 u[IU]/mL (ref 0.350–4.500)

## 2015-02-24 LAB — SALICYLATE LEVEL: Salicylate Lvl: <4 mg/dL (ref 2.8–30.0)

## 2015-02-24 MED ORDER — AMITRIPTYLINE HCL 75 MG PO TABS: 150.0000 mg | ORAL_TABLET | Freq: Every day | ORAL | Status: DC

## 2015-02-24 MED ORDER — OXYCODONE-ACETAMINOPHEN 5-325 MG PO TABS
2.0000 | ORAL_TABLET | Freq: Three times a day (TID) | ORAL | Status: DC
  Administered 2015-02-24: 2 via ORAL
  Filled 2015-02-24: qty 2

## 2015-02-24 MED ORDER — IBUPROFEN 600 MG PO TABS
600.0000 mg | ORAL_TABLET | Freq: Four times a day (QID) | ORAL | Status: DC
  Administered 2015-02-24 – 2015-02-28 (×15): 600 mg via ORAL
  Filled 2015-02-24 (×14): qty 1

## 2015-02-24 MED ORDER — NICOTINE 21 MG/24HR TD PT24
21.0000 mg | MEDICATED_PATCH | Freq: Every day | TRANSDERMAL | Status: DC
  Administered 2015-02-24: 21 mg via TRANSDERMAL
  Filled 2015-02-24: qty 1

## 2015-02-24 MED ORDER — ALUM & MAG HYDROXIDE-SIMETH 200-200-20 MG/5ML PO SUSP: 30.0000 mL | ORAL | Status: DC | PRN

## 2015-02-24 MED ORDER — OXYCODONE-ACETAMINOPHEN 5-325 MG PO TABS
1.0000 | ORAL_TABLET | Freq: Once | ORAL | Status: AC
  Administered 2015-02-24: 1 via ORAL
  Filled 2015-02-24: qty 1

## 2015-02-24 MED ORDER — BENZONATATE 100 MG PO CAPS
200.0000 mg | ORAL_CAPSULE | Freq: Three times a day (TID) | ORAL | Status: DC
  Administered 2015-02-24 – 2015-02-28 (×11): 200 mg via ORAL
  Filled 2015-02-24 (×14): qty 2

## 2015-02-24 MED ORDER — ESCITALOPRAM OXALATE 10 MG PO TABS
20.0000 mg | ORAL_TABLET | Freq: Every day | ORAL | Status: DC
Start: 2015-02-24 — End: 2015-02-24
  Administered 2015-02-24: 20 mg via ORAL
  Filled 2015-02-24 (×2): qty 2

## 2015-02-24 MED ORDER — AMITRIPTYLINE HCL 50 MG PO TABS
150.0000 mg | ORAL_TABLET | Freq: Every day | ORAL | Status: DC
  Administered 2015-02-24 – 2015-02-27 (×4): 150 mg via ORAL
  Filled 2015-02-24: qty 3
  Filled 2015-02-24 (×2): qty 2
  Filled 2015-02-24: qty 3
  Filled 2015-02-24: qty 2
  Filled 2015-02-24: qty 3

## 2015-02-24 MED ORDER — IBUPROFEN 600 MG PO TABS
600.0000 mg | ORAL_TABLET | Freq: Four times a day (QID) | ORAL | Status: DC
  Administered 2015-02-24: 800 mg via ORAL
  Filled 2015-02-24: qty 1

## 2015-02-24 MED ORDER — GABAPENTIN 300 MG PO CAPS
600.0000 mg | ORAL_CAPSULE | Freq: Three times a day (TID) | ORAL | Status: DC
Start: 2015-02-24 — End: 2015-02-28
  Administered 2015-02-24 – 2015-02-28 (×11): 600 mg via ORAL
  Filled 2015-02-24 (×12): qty 2

## 2015-02-24 MED ORDER — TRAZODONE HCL 100 MG PO TABS
200.0000 mg | ORAL_TABLET | Freq: Every day | ORAL | Status: DC
  Administered 2015-02-24 – 2015-02-27 (×4): 200 mg via ORAL
  Filled 2015-02-24 (×4): qty 2

## 2015-02-24 MED ORDER — ACETAMINOPHEN 325 MG PO TABS
650.0000 mg | ORAL_TABLET | Freq: Four times a day (QID) | ORAL | Status: DC | PRN
  Administered 2015-02-26 (×2): 650 mg via ORAL
  Filled 2015-02-24 (×2): qty 2

## 2015-02-24 MED ORDER — OXYCODONE-ACETAMINOPHEN 5-325 MG PO TABS
2.0000 | ORAL_TABLET | Freq: Three times a day (TID) | ORAL | Status: DC
  Administered 2015-02-24 – 2015-02-25 (×2): 2 via ORAL
  Filled 2015-02-24 (×4): qty 2

## 2015-02-24 MED ORDER — ESCITALOPRAM OXALATE 10 MG PO TABS
20.0000 mg | ORAL_TABLET | Freq: Every day | ORAL | Status: DC
  Administered 2015-02-25 – 2015-02-28 (×4): 20 mg via ORAL
  Filled 2015-02-24 (×4): qty 2

## 2015-02-24 MED ORDER — NICOTINE 21 MG/24HR TD PT24
21.0000 mg | MEDICATED_PATCH | Freq: Every day | TRANSDERMAL | Status: DC
  Administered 2015-02-25 – 2015-02-28 (×4): 21 mg via TRANSDERMAL
  Filled 2015-02-24 (×5): qty 1

## 2015-02-24 MED ORDER — BENZONATATE 100 MG PO CAPS
100.0000 mg | ORAL_CAPSULE | Freq: Once | ORAL | Status: AC
  Administered 2015-02-24: 100 mg via ORAL
  Filled 2015-02-24: qty 1

## 2015-02-24 MED ORDER — MAGNESIUM HYDROXIDE 400 MG/5ML PO SUSP: 30.0000 mL | Freq: Every day | ORAL | Status: DC | PRN

## 2015-02-24 MED ORDER — INFLUENZA VAC SPLIT QUAD 0.5 ML IM SUSY
0.5000 mL | PREFILLED_SYRINGE | INTRAMUSCULAR | Status: AC
  Administered 2015-02-28: 0.5 mL via INTRAMUSCULAR
  Filled 2015-02-24: qty 0.5

## 2015-02-24 MED ORDER — TRAZODONE HCL 100 MG PO TABS: 200.0000 mg | ORAL_TABLET | Freq: Every day | ORAL | Status: DC

## 2015-02-24 MED ORDER — GABAPENTIN 300 MG PO CAPS
600.0000 mg | ORAL_CAPSULE | Freq: Three times a day (TID) | ORAL | Status: DC
  Administered 2015-02-24: 600 mg via ORAL
  Filled 2015-02-24: qty 2

## 2015-02-24 NOTE — Tx Team (Addendum)
Initial Interdisciplinary Treatment Plan   PATIENT STRESSORS: Health problems Marital or family conflict Medication change or noncompliance   PATIENT STRENGTHS: Average or above average intelligence Capable of independent living Communication skills   PROBLEM LIST: Problem List/Patient Goals Date to be addressed Date deferred Reason deferred Estimated date of resolution  Depression 02/24/2015     Suicidal ideation 02/24/2015     Homicidal Ideations 02/24/2015                                          DISCHARGE CRITERIA:  Ability to meet basic life and health needs Adequate post-discharge living arrangements Reduction of life-threatening or endangering symptoms to within safe limits  PRELIMINARY DISCHARGE PLAN: Attend PHP/IOP Return to previous living arrangement  PATIENT/FAMIILY INVOLVEMENT: This treatment plan has been presented to and reviewed with the patient, Jobe MarkerRodney W Tsuchiya, and/or family member,   The patient and family have been given the opportunity to ask questions and make suggestions.  Margo CommonGigi George Maniattu 02/24/2015, 5:12 PM
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
## 2015-02-24 NOTE — Progress Notes (Signed)
49 yrs old male admitted with suicidal & homicidal ideation.Verbalized passive suicidal & homicidal ideation.patient contracts for safety.No contraband found on admission.
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
## 2015-02-24 NOTE — ED Notes (Signed)
Pt sleeping soundly in no distress, snoring loudly , he did eat moderate breakfast

## 2015-02-24 NOTE — [facility] Group Notes (Signed)
[facility] Group Notes:  (Nursing/MHT/Case Management/Adjunct)  Date:  02/24/2015  Time:  10:11 PM  Type of Therapy:  Group Therapy  Participation Level:  Active  Participation Quality:  Appropriate  Affect:  Appropriate  Cognitive:  Appropriate  Insight:  Appropriate  Engagement in Group:  Engaged  Modes of Intervention:  Discussion  Summary of Progress/Problems:  Daniel EkJanice [name] Daniel [name] 02/24/2015, 10:11 PM

## 2015-02-24 NOTE — ED Notes (Signed)
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
## 2015-02-24 NOTE — ED Notes (Signed)
Pt. Noted in room. No complaints or concerns voiced. No distress or abnormal behavior noted. Will continue to monitor with security cameras. Q 15 minute rounds continue.pt continues to have a harsh non productive cough
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
## 2015-02-24 NOTE — ED Notes (Signed)
Pt adm to beh medicine
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
## 2015-02-24 NOTE — ED Notes (Signed)
Again pt woken up for lunch and states his back is killing from the bed ,meds given I explained I had meds for him but he did not wake up

## 2015-02-24 NOTE — ED Notes (Signed)
Psych md here 
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
## 2015-02-25 DIAGNOSIS — F332 Major depressive disorder, recurrent severe without psychotic features: Principal | ICD-10-CM

## 2015-02-25 LAB — HEMOGLOBIN A1C: HEMOGLOBIN A1C: 5.2 % (ref 4.0–6.0)

## 2015-02-25 MED ORDER — OXYCODONE-ACETAMINOPHEN 5-325 MG PO TABS
2.0000 | ORAL_TABLET | Freq: Three times a day (TID) | ORAL | Status: DC
  Administered 2015-02-26 – 2015-02-28 (×8): 2 via ORAL
  Filled 2015-02-25 (×8): qty 2

## 2015-02-25 MED ORDER — AZITHROMYCIN 1 G PO PACK
1.0000 g | PACK | Freq: Once | ORAL | Status: AC
  Administered 2015-02-25: 1 g via ORAL
  Filled 2015-02-25: qty 1

## 2015-02-25 MED ORDER — OXYCODONE-ACETAMINOPHEN 5-325 MG PO TABS
2.0000 | ORAL_TABLET | Freq: Two times a day (BID) | ORAL | Status: AC
  Administered 2015-02-25 (×2): 2 via ORAL
  Filled 2015-02-25 (×2): qty 2

## 2015-02-25 NOTE — Progress Notes (Signed)
Recreation Therapy Notes  INPATIENT RECREATION THERAPY ASSESSMENT  Patient Details Name: Daniel [name] MRN: [id number] DOB: [birth year] Today's Date: 02/25/2015  Patient Stressors: Relationship, Friends, Other (Comment) (Ex-wife is living with someone else; lack of supportive friends; boarding house - people do drugs and drink. pt is a recovering addict and it is tough. pt stays to himself)  Coping Skills:   Isolate, Avoidance, Art/Dance, Music, Other (Comment) (auto racing, cars)  Personal Challenges: Anger, Communication, Concentration, Decision-Making, Expressing Yourself, Problem-Solving, Relationships, Self-Esteem/Confidence, Social Interaction, Stress Management, Trusting Others  Leisure Interests (2+):  Individual - TV, Individual - Other (Comment) (Play with cat, work on cars)  Biochemist, clinicalAwareness of Community Resources:  No  Community Resources:     Current Use:    If no, Barriers?:    Patient Strengths:  Nothing  Patient Identified Areas of Improvement:  Everything  Current Recreation Participation:  Nothing  Patient Goal for Hospitalization:  To learn coping skills, improve self-worth, more communication with others  [place]  [place]idence[place]   Current SI (including self-harm):  Yes  Current HI:  Yes  Consent to Intern Participation: N/A   [name],Rashanna Christiana M, LRT/CTRS 02/25/2015, 2:35 PM

## 2015-02-25 NOTE — Plan of Care (Signed)
Problem: Alteration in mood Goal: LTG-Patient reports reduction in suicidal thoughts (Patient reports reduction in suicidal thoughts and is able to verbalize a safety plan for whenever patient is feeling suicidal)  Outcome: Progressing Denies suicidal at present

## 2015-02-25 NOTE — [facility] Suicide Risk Assessment (Addendum)
[facility]   Nursing information obtained from:    Demographic factors:    Current Mental Status:    Loss Factors:    Historical Factors:    Risk Reduction Factors:    Total Time spent with patient: 1 hour Principal Problem: Severe recurrent major depression without psychotic features [facility](HCC) Diagnosis:   Patient Active Problem List   Diagnosis Date Noted  . Severe recurrent major depression without psychotic features (HCC) [F33.2] 02/24/2015  . Suicidal ideation [R45.851] 02/24/2015  . Chronic pain [G89.29] 02/24/2015  . Cough [R05] 02/24/2015  . Opioid use disorder, severe, dependence (HCC) [F11.20] 02/24/2015  . Hypertension [I10] 08/02/2014  . Gastric reflux [K21.9] 08/02/2014     Continued Clinical Symptoms:  Alcohol Use Disorder Identification Test Final Score (AUDIT): 0 The "Alcohol Use Disorders Identification Test", Guidelines for Use in Primary Care, Second Edition.  World Science writerHealth Organization [facility](WHO). Score between 0-7:  no or low risk or alcohol related problems. Score between 8-15:  moderate risk of alcohol related problems. Score between 16-19:  high risk of alcohol related problems. Score 20 or above:  warrants further diagnostic evaluation for alcohol dependence and treatment.   CLINICAL FACTORS:   Depression:   Hopelessness Impulsivity Severe Chronic Pain   Musculoskeletal: Strength & Muscle Tone: within normal limits Gait & Station: normal Patient leans: N/A  Psychiatric Specialty Exam: I reviewed physical exam performed in the emergency room and with findings. Physical Exam  Nursing note and vitals reviewed.   Review of Systems  Respiratory: Positive for cough.   Musculoskeletal: Positive for back pain.  All other systems reviewed and are negative.   Blood pressure 134/89, pulse 102, temperature 98.2 F (36.8 C), temperature source Oral, resp. rate 20, height 6' (1.829 m), weight 78.472 kg (173 lb), SpO2 98 %.Body mass  index is 23.46 kg/(m^2).  General Appearance: Casual  Eye Contact::  Minimal  Speech:  Slow  Volume:  Decreased  Mood:  Anxious, Depressed, Hopeless and Worthless  Affect:  Congruent  Thought Process:  Goal Directed  Orientation:  Full (Time, Place, and Person)  Thought Content:  WDL  Suicidal Thoughts:  Yes.  with intent/plan  Homicidal Thoughts:  Yes.  with intent/plan  Memory:  Immediate;   Fair Recent;   Fair Remote;   Fair  Judgement:  Fair  Insight:  Fair  Psychomotor Activity:  Normal  Concentration:  Fair  Recall:  FiservFair  Fund of Knowledge:Fair  Language: Fair  Akathisia:  No  Handed:  Right  AIMS (if indicated):     Assets:  Communication Skills Desire for Improvement Financial Resources/Insurance Housing Resilience Social Support  Sleep:     Cognition: WNL  ADL's:  Intact     COGNITIVE FEATURES THAT CONTRIBUTE TO RISK:  None    SUICIDE RISK:   Severe:  Frequent, intense, and enduring suicidal ideation, specific plan, no subjective intent, but some objective markers of intent (i.e., choice of lethal method), the method is accessible, some limited preparatory behavior, evidence of impaired self-control, severe dysphoria/symptomatology, multiple risk factors present, and few if any protective factors, particularly a lack of social support.  PLAN OF CARE:  Hospital admission, medication management, discharge planning.  Medical Decision Making:  New problem, with additional work up planned, Review of Psycho-Social Stressors (1), Review or order clinical lab tests (1), Review of Medication Regimen & Side Effects (2) and Review of New Medication or Change in Dosage (2)   [name] is a 49 year old male  with a history of depression, anxiety, and mood instability admitted for worsening depression and suicidal or homicidal ideation in the context of severe social stressors.  1. Suicidal and homicidal ideation. The patient has passing thoughts of killing his wife and  himself with a gun. He is able to contract for safety in the hospital.  2. Mood. He has been maintained and Elavil on a combination of Lexapro and Elavil. We'll continue.  3. Insomnia. This has been addressed with Elavil and trazodone..  4. Chronic pain. The patient is prescribed oxycodone by pain clinic and Neurontin.  5. Smoking. Nicotine patch is available.   6. Follow. Chest x-ray negative. We will prescribe Tessalon Perles and a Z-Pak.   7. Disposition. He will be discharged back to his boarding house. He will follow up with [facility] for medication management and pain clinic.     I certify that inpatient services furnished can reasonably be expected to improve the patient's condition.   Ardene Remley 02/25/2015, 11:52 AM

## 2015-02-25 NOTE — Progress Notes (Signed)
Recreation Therapy Notes  Date: 12.27.16 Time: 3:00 pm Location: Craft Room  Group Topic: Goal Setting  Goal Area(s) Addresses:  Patient will write at least one goal. Patient will write at least one obstacle.  Behavioral Response: Attentive, Interactive   Intervention: Recovery Goal Chart  Activity: Patients were instructed to make a Recovery Goal Chart including 3 goals towards their recovery, obstacles, the day they started working on their goals, and the day they achieved their goals.  Education: LRT educated patients on healthy ways they can celebrate reaching their goals.  Education Outcome: Acknowledges education/In group clarification offered  Clinical Observations/Feedback: Patient completed activity by writing goals and obstacles. Patient contributed to group discussion by stating how he can stay focused on his goals, how he can overcome his obstacles, and how he can celebrate reaching his goal in a healthy way.  [name],Nahia Nissan M, LRT/CTRS 02/25/2015 4:36 PM

## 2015-02-25 NOTE — Progress Notes (Signed)
D: Patient appears flat. Patient denies SI/HI/AVH at this time and states he feels safe here. Rates his pain at an 8 in his back. He attended wrapup group. He was seen in the milieu and interacting with other patients. When asked what brought him here he stated he was having trouble adjusting to reality.  A: Medication was given with education. Encouragement was provided. Therapeutic communication was also provided.  R: Patient was very receptive. He was compliant with medication. He has remained calm and cooperative. Safety maintained with 15 min checks.

## 2015-02-25 NOTE — [facility] Group Notes (Signed)
[facility] Group Notes:  (Nursing/MHT/Case Management/Adjunct)  Date:  02/25/2015  Time:  2:04 PM  Type of Therapy:  Psychoeducational Skills  Participation Level:  Did Not Attend   Marquette Oldmanda Lea Campbell 02/25/2015, 2:04 PM

## 2015-02-25 NOTE — [facility] Group Notes (Signed)
[facility] LCSW Group Therapy   02/25/2015 11:00am  Type of Therapy: Group Therapy   Participation Level: Did Not Attend. Patient invited to participate but declined.    Wilma Wuthrich F. Avaleen Brownley, MSW, LCSWA, LCAS   
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
## 2015-02-25 NOTE — Plan of Care (Signed)
Problem: Diagnosis: Increased Risk For Suicide Attempt Goal: STG-Patient Will Attend All Groups On The Unit Outcome: Progressing Patient attended wrap up group.     

## 2015-02-26 NOTE — [facility] Group Notes (Signed)
[facility] LCSW Aftercare Discharge Planning Group Note  02/26/2015 9:15 AM  Participation Quality: Did Not Attend. Patient invited to participate but declined.   Jenie Parish F. Donique Hammonds, MSW, LCSWA, LCAS   

## 2015-02-26 NOTE — [facility] Group Notes (Signed)
[facility] Group Notes:  (Nursing/MHT/Case Management/Adjunct)  Date:  02/26/2015  Time:  11:53 AM  Type of Therapy:  Psychoeducational Skills  Participation Level:  Active  Participation Quality:  Appropriate, Attentive and Sharing  Affect:  Appropriate  Cognitive:  Appropriate  Insight:  Appropriate  Engagement in Group:  Supportive  Modes of Intervention:  Activity and Support  Summary of Progress/Problems:  Daniel [name] 02/26/2015, 11:53 AM

## 2015-02-26 NOTE — Plan of Care (Signed)
Problem: [facility] Participation in Recreation Therapeutic Interventions Goal: STG-Patient will demonstrate improved self esteem by identif STG: Self-Esteem - Within 3 treatment sessions, patient will verbalize at least 5 positive affirmation statements in one treatment session to increase self-esteem post d/c.  Outcome: Completed/Met Date Met:  02/26/15 Treatment Session 1; Completed 1 out of 1: At approximately 11:45 am, LRT met with patient in patient room. Patient verbalized 5 positive affirmation statements. Patient reported it felt "pretty good". LRT encouraged patient to continue saying positive affirmation statements. Intervention Used: I Am statements  Leonette Monarch, LRT/CTRS 12.28.16 1:30 pm Goal: STG-Patient will identify at least five coping skills for ** STG: Coping Skills - Within 3 treatment sessions, patient will verbalize at least 10 coping skills in one treatment session to increase healthy coping skills post d/c.  Outcome: Completed/Met Date Met:  02/26/15 Treatment Session 1; Completed 1 out of 1: At approximately 11:45 am, LRT met with patient in patient room. Patient verbalized 15 coping skills. LRT encouraged patient to continue thinking of healthy coping skills and to write them down. Intervention Used: Coping Skills worksheet  Leonette Monarch, LRT/CTRS 12.28.16 1:31 pm Goal: STG-Other Recreation Therapy Goal (Specify) STG: Stress Management - Within 3 treatment sessions, patient will verbalized understanding of the stress management techniques in one treatment session to increase stress management skills post d/c.  Outcome: Completed/Met Date Met:  02/26/15 Treatment Session 1; Completed 1 out of 1: At approximately 11:45 am, LRT met with patient in patient room. LRT educated and provided patient with stress management handouts. Patient verbalized understanding. LRT encouraged patient to read over and practice the stress management techniques. Intervention Used: Stress  Management handouts  Leonette Monarch, LRT/CTRS 12.28.16 1:33 pm

## 2015-02-26 NOTE — Progress Notes (Signed)
D: Patient attending unit programing today . Focus on coping skills. Patient  On time for medications  Patient stated slept fair last night .Stated appetite is good and energy level  low. Stated concentration is good . Stated on Depression scale 7 , hopeless  7 and anxiety 7 .( low 0-10 high) Denies suicidal  homicidal ideations  .  No auditory hallucinations  No pain concerns . Appropriate ADL'S. Interacting with peers and staff.  A: Encourage patient participation with unit programming . Instruction  Given on  Medication , verbalize understanding. R: Voice no other concerns. Staff continue to monitor

## 2015-02-26 NOTE — Plan of Care (Signed)
Problem: Ineffective individual coping Goal: STG: Pt will be able to identify effective and ineffective STG: Pt will be able to identify effective and ineffective coping patterns  Outcome: Not Progressing Patient is not attending unit programing  Has somatic complaint

## 2015-02-26 NOTE — [facility] Group Notes (Signed)
[facility]   02/26/2015 9:15 AM  ?  Participation Quality: Alert, Appropriate and Oriented   Mood/Affect: Depressed and Flat   Depression Rating: 3-4  Anxiety Rating: 4-5  Thoughts of Suicide: Pt denies SI/HI   Will you contract for safety? Yes   Current AVH: Pt denies   Plan for Discharge/Comments: Pt attended discharge planning group and actively participated in group. CSW provided pt with today's workbook. [place] and present to the IRc for placement at a shelter.  Pt [facility].  Pt was polite and cooperative with the CSW and other group members and focused and attentive to the topics discussed and the sharing of others.   Transportation Means: Pt reports access to transportation   Supports: No supports mentioned at this time     Dorothe PeaJonathan F. Liberty Seto, MSW, LCSWA, LCAS

## 2015-02-26 NOTE — Progress Notes (Signed)
   02/26/15 1200  Clinical Encounter Type  Visited With Patient not available  Visit Type Initial  Referral From Nurse  Consult/Referral To Chaplain  Attempted to contact patient (2x). Patient did not desire to consult w/chaplain at this time. Will continue to follow up during routine roundings. Chap. Jalecia Leon G. Truett Mcfarlan, ext. [phone]

## 2015-02-26 NOTE — Progress Notes (Signed)
Patient separated Percocet 2 Tabs from the rest of his bedtime medications, tried to hide it, then try to cheek it, when mouth checked "I wanted to chew it, I like to chew it ..." Nursing staffs will check for medication cheeking.

## 2015-02-26 NOTE — Progress Notes (Signed)
Recreation Therapy Notes  Date: 12.28.16 Time: 3:00 pm Location: Craft Room  Group Topic: Self-esteem  Goal Area(s) Addresses:  Patient will identify positive attributes about self. Patient will identify at least one coping skill.  Behavioral Response: Attentive, Interactive  Intervention: All About Me  Activity: Patients were instructed to make an "All About Me" pamphlet including their life's motto, positive traits, healthy coping skills, and their support system.  Education: LRT educated patients on ways they can increase their self-esteem.  Education Outcome: Acknowledges education/In group clarification offered  Clinical Observations/Feedback: Patient did not participate in group activity. Patient contributed to group discussion by stating why he doesn't use healthy coping skills, and how his self-esteem affects him.  [name],Arian Murley M, LRT/CTRS 02/26/2015 4:27 PM

## 2015-02-26 NOTE — Progress Notes (Signed)
Recreation Therapy Notes  INPATIENT RECREATION TR PLAN  Patient Details Name: Daniel [name] MRN: [id number] DOB: Apr 03, 1965 Today's Date: 02/26/2015  Rec Therapy Plan Is patient appropriate for Therapeutic Recreation?: Yes Treatment times per week: At least once a week TR Treatment/Interventions: 1:1 session, Group participation (Comment) (Appropriate participation in daily recreation therapy tx)  Discharge Criteria Pt will be discharged from therapy if:: Treatment goals are met, Discharged Treatment plan/goals/alternatives discussed and agreed upon by:: Patient/family  Discharge Summary Short term goals set: See Care Plan Short term goals met: Complete Progress toward goals comments: One-to-one attended Which groups?: Self-esteem, Goal setting One-to-one attended: Self-esteem, stress management, coping skills Reason goals not met: N/A Therapeutic equipment acquired: None Reason patient discharged from therapy: Treatment goals met Pt/family agrees with progress & goals achieved: Yes Date patient discharged from therapy: 02/26/15   Leonette Monarch, LRT/CTRS 02/26/2015, 4:40 PM

## 2015-02-26 NOTE — Plan of Care (Signed)
Problem: Ineffective individual coping Goal: STG: Patient will remain free from self harm Outcome: Progressing 15 minute checks maintained for safety, clinical and moral support provided, patient encouraged to continue to express feelings and demonstrate safe care. Patient remain free from harm, will continue to monitor.          

## 2015-02-26 NOTE — Progress Notes (Signed)
Patient requested for Percocet that is due at 0630 for back pain 8/10; Tylenol 650 mg given now to manage chronic back pain.

## 2015-02-26 NOTE — Progress Notes (Signed)
A&Ox3, noted of non-productive cough, "not a new onset, the Dr. Is giving me Tessalon Capsule for it and it is working,....but I need  Lyrica for leg pain and swelling, awaiting medicaid approval; can you ask the Dr. Dorothea Ogleo add Lyrica to my medications?" BP=121/60, HR=91. Contemplating SI/HI, evasive about specific plan, consolation in after live consequence "I don't want to kill myself or anyone, because I want to go to heaven; I will not kill myself. It is hard, you know. My wife left me once there is no more money and my daughter will not talk to me ......." Nursing staffs will continue to encourage verbalization of feelings, monitor depressive symptoms, assess for SI.

## 2015-02-26 NOTE — Progress Notes (Signed)
[facility]  02/26/2015 11:19 AM Daniel [name]  MRN:  [id number]  Subjective: Mr. Daniel DebarBays feels slightly better today. Mood is improving affect is still tearful. He still has suicidal thoughts. He no longer wants to kill his wife. The nurses report he was trying to cheek his medications this morning. He denies. He still complains of productive cough for which she was prescribed an antibiotic. Sleep and appetite are fair. There are no other somatic complaints except for chronic back pain. He participates in programming.  Principal Problem: Severe recurrent major depression without psychotic features (HCC) Diagnosis:   Patient Active Problem List   Diagnosis Date Noted  . Severe recurrent major depression without psychotic features (HCC) [F33.2] 02/24/2015  . Suicidal ideation [R45.851] 02/24/2015  . Chronic pain [G89.29] 02/24/2015  . Cough [R05] 02/24/2015  . Opioid use disorder, severe, dependence (HCC) [F11.20] 02/24/2015  . Hypertension [I10] 08/02/2014  . Gastric reflux [K21.9] 08/02/2014   Total Time spent with patient: 20 minutes  Past Psychiatric History: Depression and anxiety.  Past Medical History:  Past Medical History  Diagnosis Date  . Hypertension   . Bipolar 1 disorder (HCC)   . Chronic pain   . Anxiety     Past Surgical History  Procedure Laterality Date  . Knee surgery Left    Family History: History reviewed. No pertinent family history. Family Psychiatric  History: None reported. Social History:  History  Alcohol Use No     History  Drug Use No    Social History   Social History  . Marital Status: Divorced    Spouse Name: N/A  . Number of Children: N/A  . Years of Education: N/A   Social History Main Topics  . Smoking status: Former Games developermoker  . Smokeless tobacco: Never Used  . Alcohol Use: No  . Drug Use: No  . Sexual Activity: Not Asked   Other Topics Concern  . None   Social History Narrative   Additional Social History:     History of alcohol / drug use?: No history of alcohol / drug abuse                    Sleep: Fair  Appetite:  Good  Current Medications: Current Facility-Administered Medications  Medication Dose Route Frequency Provider Last Rate Last Dose  . acetaminophen (TYLENOL) tablet 650 mg  650 mg Oral Q6H PRN Audery AmelJohn T Clapacs, MD   650 mg at 02/26/15 0346  . alum & mag hydroxide-simeth (MAALOX/MYLANTA) 200-200-20 MG/5ML suspension 30 mL  30 mL Oral Q4H PRN Audery AmelJohn T Clapacs, MD      . amitriptyline (ELAVIL) tablet 150 mg  150 mg Oral QHS Audery AmelJohn T Clapacs, MD   150 mg at 02/25/15 2120  . benzonatate (TESSALON) capsule 200 mg  200 mg Oral TID Shari ProwsJolanta B Tracy Kinner, MD   200 mg at 02/26/15 0925  . escitalopram (LEXAPRO) tablet 20 mg  20 mg Oral Daily Audery AmelJohn T Clapacs, MD   20 mg at 02/26/15 0925  . gabapentin (NEURONTIN) capsule 600 mg  600 mg Oral TID Audery AmelJohn T Clapacs, MD   600 mg at 02/26/15 0925  . ibuprofen (ADVIL,MOTRIN) tablet 600 mg  600 mg Oral QID Audery AmelJohn T Clapacs, MD   600 mg at 02/26/15 0925  . Influenza vac split quadrivalent PF (FLUARIX) injection 0.5 mL  0.5 mL Intramuscular Tomorrow-1000 Nyliah Nierenberg B Catlin Aycock, MD   0.5 mL at 02/25/15 0934  . magnesium hydroxide (MILK OF MAGNESIA)  suspension 30 mL  30 mL Oral Daily PRN Audery Amel, MD      . nicotine (NICODERM CQ - dosed in mg/24 hours) patch 21 mg  21 mg Transdermal Daily Audery Amel, MD   21 mg at 02/26/15 1005  . oxyCODONE-acetaminophen (PERCOCET/ROXICET) 5-325 MG per tablet 2 tablet  2 tablet Oral TID Shari Prows, MD   2 tablet at 02/26/15 0624  . traZODone (DESYREL) tablet 200 mg  200 mg Oral QHS Audery Amel, MD   200 mg at 02/25/15 2121    Lab Results: No results found for this or any previous visit (from the past 48 hour(s)).  Physical Findings: AIMS:  , ,  ,  , Dental Status Current problems with teeth and/or dentures?: No Does patient usually wear dentures?: No  CIWA:    COWS:     Musculoskeletal: Strength &  Muscle Tone: within normal limits Gait & Station: normal Patient leans: N/A  Psychiatric Specialty Exam: Review of Systems  Respiratory: Positive for cough.   Musculoskeletal: Positive for back pain.  All other systems reviewed and are negative.   Blood pressure 129/85, pulse 97, temperature 98.5 F (36.9 C), temperature source Oral, resp. rate 18, height 6' (1.829 m), weight 78.472 kg (173 lb), SpO2 98 %.Body mass index is 23.46 kg/(m^2).  General Appearance: Casual  Eye Contact::  Good  Speech:  Clear and Coherent  Volume:  Normal  Mood:  Depressed, Hopeless and Worthless  Affect:  Tearful  Thought Process:  Goal Directed  Orientation:  Full (Time, Place, and Person)  Thought Content:  WDL  Suicidal Thoughts:  Yes.  with intent/plan  Homicidal Thoughts:  No  Memory:  Immediate;   Fair Recent;   Fair Remote;   Fair  Judgement:  Fair  Insight:  Fair  Psychomotor Activity:  Normal  Concentration:  Fair  Recall:  [employer] of Knowledge:Fair  Language: Fair  Akathisia:  No  Handed:  Right  AIMS (if indicated):     Assets:  Communication Skills Desire for Improvement Financial Resources/Insurance Housing Resilience Social Support  ADL's:  Intact  Cognition: WNL  Sleep:  Number of Hours: 6.45   Treatment Plan Summary: Daily contact with patient to assess and evaluate symptoms and progress in treatment and Medication management   Daniel [name] is a 49 year old male with a history of depression, anxiety, and mood instability admitted for worsening depression and suicidal or homicidal ideation in the context of severe social stressors.  1. Suicidal and homicidal ideation. The patient has passing thoughts of killing himself with a gun. He is able to contract for safety in the hospital.  2. Mood. He has been maintained and Elavil on a combination of Lexapro and Elavil. We'll continue.  3. Insomnia. This has been addressed with Elavil and trazodone..  4. Chronic pain. The  patient is prescribed oxycodone and Neurontin by pain clinic.   5. Smoking. Nicotine patch is available.   6. Follow. Chest x-ray negative. We will prescribe Tessalon Perles and a Z-Pak.   7. Disposition. He will be discharged back to his boarding house. He will follow up with [facility] for medication management and pain clinic.    Daniel [name] 02/26/2015, 11:19 AM

## 2015-02-27 MED ORDER — OXYCODONE-ACETAMINOPHEN 10-325 MG PO TABS: 1.0000 | ORAL_TABLET | Freq: Three times a day (TID) | ORAL | Status: DC | PRN

## 2015-02-27 MED ORDER — AMITRIPTYLINE HCL 150 MG PO TABS: 150.0000 mg | ORAL_TABLET | Freq: Every day | ORAL | Status: DC

## 2015-02-27 MED ORDER — AZITHROMYCIN 250 MG PO TABS
500.0000 mg | ORAL_TABLET | Freq: Every day | ORAL | Status: AC
  Administered 2015-02-27: 500 mg via ORAL
  Filled 2015-02-27: qty 1

## 2015-02-27 MED ORDER — GABAPENTIN 600 MG PO TABS: 600.0000 mg | ORAL_TABLET | Freq: Three times a day (TID) | ORAL | Status: DC

## 2015-02-27 MED ORDER — TRAZODONE HCL 150 MG PO TABS: 150.0000 mg | ORAL_TABLET | Freq: Every day | ORAL | Status: DC

## 2015-02-27 MED ORDER — AZITHROMYCIN 250 MG PO TABS: ORAL_TABLET | ORAL | Status: DC

## 2015-02-27 MED ORDER — ESCITALOPRAM OXALATE 20 MG PO TABS: 20.0000 mg | ORAL_TABLET | Freq: Every day | ORAL | Status: DC

## 2015-02-27 NOTE — Progress Notes (Signed)
[facility] MD Progress Note  02/27/2015 11:51 AM Daniel [name]  MRN:  [id number]  Subjective:  Daniel [name] still feels depressed, hopeless and worthless. He still has passive thoughts of suicide but no longer wants to hurt his wife. He was able to talk to his ex-in-laws were very supportive. His pain is in good control. He still is coughing badly we will continue azithromycin. There is good group participation.  Principal Problem: Severe recurrent major depression without psychotic features (HCC) Diagnosis:   Patient Active Problem List   Diagnosis Date Noted  . Severe recurrent major depression without psychotic features (HCC) [F33.2] 02/24/2015  . Suicidal ideation [R45.851] 02/24/2015  . Chronic pain [G89.29] 02/24/2015  . Cough [R05] 02/24/2015  . Opioid use disorder, severe, dependence (HCC) [F11.20] 02/24/2015  . Hypertension [I10] 08/02/2014  . Gastric reflux [K21.9] 08/02/2014   Total Time spent with patient: 15 minutes  Past Psychiatric History: Depression and anxiety.  Past Medical History:  Past Medical History  Diagnosis Date  . Hypertension   . Bipolar 1 disorder (HCC)   . Chronic pain   . Anxiety     Past Surgical History  Procedure Laterality Date  . Knee surgery Left    Family History: History reviewed. No pertinent family history. Family Psychiatric  History: None reported. Social History:  History  Alcohol Use No     History  Drug Use No    Social History   Social History  . Marital Status: Divorced    Spouse Name: N/A  . Number of Children: N/A  . Years of Education: N/A   Social History Main Topics  . Smoking status: Former Games developer  . Smokeless tobacco: Never Used  . Alcohol Use: No  . Drug Use: No  . Sexual Activity: Not Asked   Other Topics Concern  . None   Social History Narrative   Additional Social History:    History of alcohol / drug use?: No history of alcohol / drug abuse                    Sleep: Fair  Appetite:   Fair  Current Medications: Current Facility-Administered Medications  Medication Dose Route Frequency Provider Last Rate Last Dose  . acetaminophen (TYLENOL) tablet 650 mg  650 mg Oral Q6H PRN Audery Amel, MD   650 mg at 02/26/15 2040  . alum & mag hydroxide-simeth (MAALOX/MYLANTA) 200-200-20 MG/5ML suspension 30 mL  30 mL Oral Q4H PRN Audery Amel, MD      . amitriptyline (ELAVIL) tablet 150 mg  150 mg Oral QHS Audery Amel, MD   150 mg at 02/26/15 2134  . azithromycin (ZITHROMAX) tablet 500 mg  500 mg Oral Daily Jolanta B Pucilowska, MD      . benzonatate (TESSALON) capsule 200 mg  200 mg Oral TID Shari Prows, MD   200 mg at 02/27/15 1032  . escitalopram (LEXAPRO) tablet 20 mg  20 mg Oral Daily Audery Amel, MD   20 mg at 02/27/15 1032  . gabapentin (NEURONTIN) capsule 600 mg  600 mg Oral TID Audery Amel, MD   600 mg at 02/27/15 1032  . ibuprofen (ADVIL,MOTRIN) tablet 600 mg  600 mg Oral QID Audery Amel, MD   600 mg at 02/27/15 1032  . Influenza vac split quadrivalent PF (FLUARIX) injection 0.5 mL  0.5 mL Intramuscular Tomorrow-1000 Jolanta B Pucilowska, MD   0.5 mL at 02/25/15 0934  . magnesium hydroxide (MILK  OF MAGNESIA) suspension 30 mL  30 mL Oral Daily PRN Audery AmelJohn T Clapacs, MD      . nicotine (NICODERM CQ - dosed in mg/24 hours) patch 21 mg  21 mg Transdermal Daily Audery AmelJohn T Clapacs, MD   21 mg at 02/27/15 1032  . oxyCODONE-acetaminophen (PERCOCET/ROXICET) 5-325 MG per tablet 2 tablet  2 tablet Oral TID Shari ProwsJolanta B Pucilowska, MD   2 tablet at 02/27/15 0607  . traZODone (DESYREL) tablet 200 mg  200 mg Oral QHS Audery AmelJohn T Clapacs, MD   200 mg at 02/26/15 2134    Lab Results: No results found for this or any previous visit (from the past 48 hour(s)).  Physical Findings: AIMS:  , ,  ,  , Dental Status Current problems with teeth and/or dentures?: No Does patient usually wear dentures?: No  CIWA:    COWS:     Musculoskeletal: Strength & Muscle Tone: within normal  limits Gait & Station: normal Patient leans: N/A  Psychiatric Specialty Exam: Review of Systems  Musculoskeletal: Positive for back pain.  Psychiatric/Behavioral: Positive for depression and suicidal ideas.  All other systems reviewed and are negative.   Blood pressure 127/84, pulse 103, temperature 98.2 F (36.8 C), temperature source Oral, resp. rate 20, height 6' (1.829 m), weight 78.472 kg (173 lb), SpO2 98 %.Body mass index is 23.46 kg/(m^2).  General Appearance: Casual  Eye Contact::  Good  Speech:  Clear and Coherent  Volume:  Normal  Mood:  Depressed, Hopeless and Worthless  Affect:  Blunt  Thought Process:  Goal Directed  Orientation:  Full (Time, Place, and Person)  Thought Content:  WDL  Suicidal Thoughts:  Yes.  with intent/plan  Homicidal Thoughts:  No  Memory:  Immediate;   Fair Recent;   Fair Remote;   Fair  Judgement:  Fair  Insight:  Fair  Psychomotor Activity:  Decreased  Concentration:  Fair  Recall:  [employer]Fair  Fund of Knowledge:Fair  Language: Fair  Akathisia:  No  Handed:  Right  AIMS (if indicated):     Assets:  Communication Skills Desire for Improvement Financial Resources/Insurance Housing Resilience Social Support  ADL's:  Intact  Cognition: WNL  Sleep:  Number of Hours: 6.15   Treatment Plan Summary: Daily contact with patient to assess and evaluate symptoms and progress in treatment and Medication management   Daniel [name] is a 49 year old male with a history of depression, anxiety, and mood instability admitted for worsening depression and suicidal or homicidal ideation in the context of severe social stressors.  1. Suicidal and homicidal ideation. The patient has passing thoughts of killing himself with a gun. He is able to contract for safety in the hospital.  2. Mood. He has been maintained and Elavil on a combination of Lexapro and Elavil. We'll continue.  3. Insomnia. This has been addressed with Elavil and trazodone..  4. Chronic  pain. The patient is prescribed oxycodone and Neurontin by pain clinic.   5. Smoking. Nicotine patch is available.   6. Follow. Chest x-ray negative. We will prescribe Tessalon Perles and a Z-Pak.   7. Disposition. He will be discharged back to his boarding house. He will follow up with [facility] for medication management and pain clinic.   Jolanta Pucilowska 02/27/2015, 11:51 AM

## 2015-02-27 NOTE — Tx Team (Signed)
Interdisciplinary Treatment Plan Update (Adult)  Date:  02/27/2015 Time Reviewed:  5:00 PM  Progress in Treatment: Attending groups: Yes. Participating in groups:  Yes. Taking medication as prescribed:  Yes. Tolerating medication:  Yes. Family/Significant othe contact made:  No, will contact:  Pt refused  Patient understands diagnosis:  Yes. Discussing patient identified problems/goals with staff:  Yes. Medical problems stabilized or resolved:  Yes. Denies suicidal/homicidal ideation: Yes. Issues/concerns per patient self-inventory:  Yes. Other:  New problem(s) identified: No, Describe:  Na  Discharge Plan or Barriers: Pt plans to return home and follow up with outpatient.    Reason for Continuation of Hospitalization: Depression Homicidal ideation Medication stabilization Suicidal ideation  Comments: Daniel [name] still feels depressed, hopeless and worthless. He still has passive thoughts of suicide but no longer wants to hurt his wife. He was able to talk to his ex-in-laws were very supportive. His pain is in good control. He still is coughing badly we will continue azithromycin. There is good group participation.  Estimated length of stay: Pt will likely d/c tomorrow.   New goal(s):  Revie w of initial/current patient goals per problem list:   1.  Goal(s): Patient will participate in aftercare plan * Met:  * Target date: at discharge * As evidenced by: Patient will participate within aftercare plan AEB aftercare provider and housing plan at discharge being identified.   2.  Goal (s): Patient will exhibit decreased depressive symptoms and suicidal ideations. * Met:  *  Target date: at discharge * As evidenced by: Patient will utilize self rating of depression at 3 or below and demonstrate decreased signs of depression or be deemed stable for discharge by MD.   3.  Goal(s): Patient will demonstrate decreased signs and symptoms of anxiety. * Met:  * Target date: at  discharge * As evidenced by: Patient will utilize self rating of anxiety at 3 or below and demonstrated decreased signs of anxiety, or be deemed stable for discharge by MD  Attendees: Patient:  Daniel [name] 12/29/20165:00 PM  Family:   12/29/20165:00 PM  Physician:   Dr. Bary Leriche  12/29/20165:00 PM  Nursing:   Silva Bandy, RN  12/29/20165:00 PM  Case Manager:   12/29/20165:00 PM  Counselor:   12/29/20165:00 PM  Other:  Wray Kearns, [place] 12/29/20165:00 PM  Other:   12/29/20165:00 PM  Other:   12/29/20165:00 PM  Other:  12/29/20165:00 PM  Other:  12/29/20165:00 PM  Other:  12/29/20165:00 PM  Other:  12/29/20165:00 PM  Other:  12/29/20165:00 PM  Other:  12/29/20165:00 PM  Other:   12/29/20165:00 PM   Scribe for Treatment Team:   Campbell Stall Kemari Mares,MSW, LCSWA  02/27/2015, 5:00 PM

## 2015-02-27 NOTE — [facility] Group Notes (Addendum)
[facility] LCSW Group Therapy   02/27/2015 11:00 am  Type of Therapy: Group Therapy   Participation Level: Did Not Attend. Patient invited to participate but declined.    Weltha Cathy F. Kamauri Kathol, MSW, LCSWA, LCAS   

## 2015-02-27 NOTE — Progress Notes (Signed)
D: Patient attended evening group interacted appropriately with staff and peers.  Denies SI, HI and AVH. Tylenol given for headache, effective. Voiced no other concerns. A: Encourage patient participation with unit programming. Encouragement and support provided, medications given as prescribed. Q15 minute checks maintained for safety. R: Pt seen socializing appropriately in day room. Interactions appropriate. No complaints or concerns. Compliant with medications. Staff will continue to monitor.
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
## 2015-02-27 NOTE — Progress Notes (Signed)
Rates depression as a 7 although affect does not resemble depression.  Smiles readily and visible in milieu.  Interacting an joking with staff and peers.  Denies SI.  Stated goal is to continue to work on Pharmacologistcoping skills.  Support and encouragement offered.  Safety maintained.

## 2015-02-28 NOTE — [facility] Group Notes (Signed)
[facility] Group Notes:  (Nursing/MHT/Case Management/Adjunct)  Date:  02/28/2015  Time:  11:41 AM  Type of Therapy:  Psychoeducational Skills  Participation Level:  Did Not Attend   Lynelle SmokeCara Travis [facility] Center[facility] 02/28/2015, 11:41 AM
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
## 2015-02-28 NOTE — [facility] Suicide Risk Assessment (Signed)
[facility]   Demographic Factors:  Male, Divorced or widowed, Caucasian, Low socioeconomic status and Living alone  Total Time spent with patient: 30 minutes  Musculoskeletal: Strength & Muscle Tone: within normal limits Gait & Station: normal Patient leans: N/A  Psychiatric Specialty Exam: Physical Exam  Nursing note and vitals reviewed.   Review of Systems  Musculoskeletal: Positive for back pain.  All other systems reviewed and are negative.   Blood pressure 140/77, pulse 92, temperature 98.2 F (36.8 C), temperature source Oral, resp. rate 20, height 6' (1.829 m), weight 78.472 kg (173 lb), SpO2 98 %.Body mass index is 23.46 kg/(m^2).  General Appearance: Casual  Eye Contact::  Good  Speech:  Clear and Coherent409  Volume:  Normal  Mood:  Euthymic  Affect:  Appropriate  Thought Process:  Goal Directed  Orientation:  Full (Time, Place, and Person)  Thought Content:  WDL  Suicidal Thoughts:  No  Homicidal Thoughts:  No  Memory:  Immediate;   Fair Recent;   Fair Remote;   Fair  Judgement:  Fair  Insight:  Fair  Psychomotor Activity:  Normal  Concentration:  Fair  Recall:  Fis[employer]Fair  Fund of Knowledge:Fair  Language: Fair  Akathisia:  No  Handed:  Right  AIMS (if indicated):     Assets:  Communication Skills Desire for Improvement Financial Resources/Insurance Housing Resilience Social Support  Sleep:  Number of Hours: 6.3  Cognition: WNL  ADL's:  Intact   Have you used any form of tobacco in the last 30 days? (Cigarettes, Smokeless Tobacco, Cigars, and/or Pipes): Yes  Has this patient used any form of tobacco in the last 30 days? (Cigarettes, Smokeless Tobacco, Cigars, and/or Pipes) Yes, A prescription for an FDA-approved tobacco cessation medication was offered at discharge and the patient refused  Mental Status Per Nursing Assessment::   On Admission:     Current Mental Status by Physician: NA  Loss Factors: Loss of  significant relationship and Financial problems/change in socioeconomic status  Historical Factors: Prior suicide attempts and Impulsivity  Risk Reduction Factors:   Sense of responsibility to family, Positive social support and Positive therapeutic relationship  Continued Clinical Symptoms:  Depression:   Impulsivity  Cognitive Features That Contribute To Risk:  None    Suicide Risk:  Minimal: No identifiable suicidal ideation.  Patients presenting with no risk factors but with morbid ruminations; may be classified as minimal risk based on the severity of the depressive symptoms  Principal Problem: Severe recurrent major depression without psychotic features [facility](HCC) Discharge Diagnoses:  Patient Active Problem List   Diagnosis Date Noted  . Severe recurrent major depression without psychotic features (HCC) [F33.2] 02/24/2015  . Suicidal ideation [R45.851] 02/24/2015  . Chronic pain [G89.29] 02/24/2015  . Cough [R05] 02/24/2015  . Opioid use disorder, severe, dependence (HCC) [F11.20] 02/24/2015  . Hypertension [I10] 08/02/2014  . Gastric reflux [K21.9] 08/02/2014      Plan Of Care/Follow-up recommendations:  Activity:  as tolerated Diet:  low sodium heart healthy Other:  keep follow up appointments  Is patient on multiple antipsychotic therapies at discharge:  No   Has Patient had three or more failed trials of antipsychotic monotherapy by history:  No  Recommended Plan for Multiple Antipsychotic Therapies: NA    Calena Salem 02/28/2015, 8:13 AM

## 2015-02-28 NOTE — Progress Notes (Signed)
  [facility] :  Will you be returning to the same living situation after discharge:  Yes,  boarding house At discharge, do you have transportation home?: Yes,  bus Do you have the ability to pay for your medications: Yes,  Income, insurance   Release of information consent forms completed and in the chart;  Patient's signature needed at discharge.  Patient to Follow up at: Follow-up Information    [facility] Go on 03/03/2015.   Why:  Your hospital follow up appointment will be walk in. Walk in hours are Monday- Friday between 8:00am and 10:00am. Please call Lorella NimrodHarvey [phone]) on Monday to help with transportation.    Contact information:   [address] [place] [address] [phone]       Next level of care provider has [facility] to [facility] Link:no  Safety Planning and Suicide Prevention discussed: Yes,  with patient   Have you used any form of tobacco in the last 30 days? (Cigarettes, Smokeless Tobacco, Cigars, and/or Pipes): Yes  Has patient been referred to the Quitline?: Patient refused referral  Patient has been referred for addiction treatment: Pt. refused referral  Rondall Allegraandace L Kwesi Sangha MSW, LCSWA  02/28/2015, 9:24 AM

## 2015-02-28 NOTE — BHH Group Notes (Signed)
B[facility] LCSW Aftercare Discharge Planning Group Note  02/28/2015 9:15 AM  Participation Quality: Did Not Attend. Patient invited to participate but declined.   Daniel [name] F. Amiya Escamilla, MSW, LCSWA, LCAS   

## 2015-02-28 NOTE — BHH Suicide Risk Assessment (Signed)
B[facility] INPATIENT:  Family/Significant Other Suicide Prevention Education  Suicide Prevention Education:  Patient Refusal for Family/Significant Other Suicide Prevention Education: The patient Daniel [name] has refused to provide written consent for family/significant other to be provided Family/Significant Other Suicide Prevention Education during admission and/or prior to discharge.  Physician notified.  SPE completed with pt and he was encouraged to share information provided in SPI pamphlet with support network, ask questions, and talk about any concerns relating to SPE. Pt denies SI/HI/AVH and verbalized understanding of information presented.    Chrystopher Stangl L Alzena Gerber MSW, LCSWA  02/28/2015, 9:23 AM

## 2015-03-11 ENCOUNTER — Emergency Department
Admission: EM | Admit: 2015-03-11 | Discharge: 2015-03-11 | Disposition: A | Discharge status: Other Acute Inpatient Hospital | Payer: Medicaid Other | Attending: Emergency Medicine | Admitting: Emergency Medicine

## 2015-03-11 ENCOUNTER — Inpatient Hospital Stay
Admission: EM | Admit: 2015-03-11 | Discharge: 2015-03-19 | DRG: 885 | Disposition: A | Discharge status: Home / Self Care | Payer: Medicaid Other | Source: Intra-hospital | Attending: Psychiatry | Admitting: Psychiatry

## 2015-03-11 ENCOUNTER — Encounter: Payer: Self-pay | Admitting: Urgent Care

## 2015-03-11 ENCOUNTER — Emergency Department: Payer: Medicaid Other

## 2015-03-11 ENCOUNTER — Encounter: Payer: Self-pay | Admitting: Psychiatry

## 2015-03-11 DIAGNOSIS — Z79899 Other long term (current) drug therapy: Secondary | ICD-10-CM | POA: Diagnosis not present

## 2015-03-11 DIAGNOSIS — E8881 Metabolic syndrome: Secondary | ICD-10-CM | POA: Diagnosis present

## 2015-03-11 DIAGNOSIS — Y998 Other external cause status: Secondary | ICD-10-CM | POA: Insufficient documentation

## 2015-03-11 DIAGNOSIS — F418 Other specified anxiety disorders: Secondary | ICD-10-CM | POA: Diagnosis present

## 2015-03-11 DIAGNOSIS — Z87891 Personal history of nicotine dependence: Secondary | ICD-10-CM | POA: Insufficient documentation

## 2015-03-11 DIAGNOSIS — Y92048 Other place in boarding-house as the place of occurrence of the external cause: Secondary | ICD-10-CM | POA: Diagnosis not present

## 2015-03-11 DIAGNOSIS — F332 Major depressive disorder, recurrent severe without psychotic features: Principal | ICD-10-CM | POA: Diagnosis present

## 2015-03-11 DIAGNOSIS — Y9301 Activity, walking, marching and hiking: Secondary | ICD-10-CM | POA: Insufficient documentation

## 2015-03-11 DIAGNOSIS — G8929 Other chronic pain: Secondary | ICD-10-CM | POA: Diagnosis present

## 2015-03-11 DIAGNOSIS — F515 Nightmare disorder: Secondary | ICD-10-CM | POA: Diagnosis present

## 2015-03-11 DIAGNOSIS — G4700 Insomnia, unspecified: Secondary | ICD-10-CM | POA: Diagnosis present

## 2015-03-11 DIAGNOSIS — Z9889 Other specified postprocedural states: Secondary | ICD-10-CM

## 2015-03-11 DIAGNOSIS — R45850 Homicidal ideations: Secondary | ICD-10-CM | POA: Diagnosis present

## 2015-03-11 DIAGNOSIS — F329 Major depressive disorder, single episode, unspecified: Secondary | ICD-10-CM | POA: Insufficient documentation

## 2015-03-11 DIAGNOSIS — S3992XA Unspecified injury of lower back, initial encounter: Secondary | ICD-10-CM | POA: Diagnosis not present

## 2015-03-11 DIAGNOSIS — W108XXA Fall (on) (from) other stairs and steps, initial encounter: Secondary | ICD-10-CM | POA: Diagnosis not present

## 2015-03-11 DIAGNOSIS — R45851 Suicidal ideations: Secondary | ICD-10-CM | POA: Diagnosis present

## 2015-03-11 DIAGNOSIS — Z792 Long term (current) use of antibiotics: Secondary | ICD-10-CM | POA: Diagnosis not present

## 2015-03-11 DIAGNOSIS — F410 Panic disorder [episodic paroxysmal anxiety] without agoraphobia: Secondary | ICD-10-CM | POA: Diagnosis present

## 2015-03-11 DIAGNOSIS — Z9119 Patient's noncompliance with other medical treatment and regimen: Secondary | ICD-10-CM | POA: Diagnosis not present

## 2015-03-11 DIAGNOSIS — F1120 Opioid dependence, uncomplicated: Secondary | ICD-10-CM | POA: Diagnosis present

## 2015-03-11 DIAGNOSIS — M545 Low back pain: Secondary | ICD-10-CM | POA: Diagnosis present

## 2015-03-11 DIAGNOSIS — Z008 Encounter for other general examination: Secondary | ICD-10-CM | POA: Diagnosis present

## 2015-03-11 DIAGNOSIS — K219 Gastro-esophageal reflux disease without esophagitis: Secondary | ICD-10-CM | POA: Diagnosis present

## 2015-03-11 DIAGNOSIS — I10 Essential (primary) hypertension: Secondary | ICD-10-CM | POA: Diagnosis present

## 2015-03-11 LAB — CBC
HEMATOCRIT: 40.9 % (ref 40.0–52.0)
HEMOGLOBIN: 13.8 g/dL (ref 13.0–18.0)
MCH: 30.1 pg (ref 26.0–34.0)
MCHC: 33.8 g/dL (ref 32.0–36.0)
MCV: 89 fL (ref 80.0–100.0)
Platelets: 237 10*3/uL (ref 150–440)
RBC: 4.6 MIL/uL (ref 4.40–5.90)
RDW: 13.5 % (ref 11.5–14.5)
WBC: 5.7 10*3/uL (ref 3.8–10.6)

## 2015-03-11 LAB — ACETAMINOPHEN LEVEL

## 2015-03-11 LAB — COMPREHENSIVE METABOLIC PANEL
ALBUMIN: 4.4 g/dL (ref 3.5–5.0)
ALK PHOS: 62 U/L (ref 38–126)
ALT: 26 U/L (ref 17–63)
ANION GAP: 6 (ref 5–15)
AST: 24 U/L (ref 15–41)
BUN: 10 mg/dL (ref 6–20)
CALCIUM: 9 mg/dL (ref 8.9–10.3)
CHLORIDE: 103 mmol/L (ref 101–111)
CO2: 28 mmol/L (ref 22–32)
Creatinine, Ser: 0.86 mg/dL (ref 0.61–1.24)
GFR calc Af Amer: >60 mL/min (ref >60)
GFR calc non Af Amer: >60 mL/min (ref >60)
GLUCOSE: 101 mg/dL — AB (ref 65–99)
Potassium: 3.7 mmol/L (ref 3.5–5.1)
SODIUM: 137 mmol/L (ref 135–145)
Total Bilirubin: 0.5 mg/dL (ref 0.3–1.2)
Total Protein: 7.3 g/dL (ref 6.5–8.1)

## 2015-03-11 LAB — SALICYLATE LEVEL: Salicylate Lvl: <4 mg/dL (ref 2.8–30.0)

## 2015-03-11 LAB — ETHANOL: Alcohol, Ethyl (B): <5 mg/dL (ref <5)

## 2015-03-11 MED ORDER — GABAPENTIN 600 MG PO TABS
600.0000 mg | ORAL_TABLET | Freq: Three times a day (TID) | ORAL | Status: DC
  Administered 2015-03-11 – 2015-03-13 (×5): 600 mg via ORAL
  Filled 2015-03-11 (×5): qty 1

## 2015-03-11 MED ORDER — IBUPROFEN 800 MG PO TABS
800.0000 mg | ORAL_TABLET | Freq: Three times a day (TID) | ORAL | Status: DC | PRN
  Filled 2015-03-11: qty 1

## 2015-03-11 MED ORDER — TRAZODONE HCL 50 MG PO TABS: 150.0000 mg | ORAL_TABLET | Freq: Every day | ORAL | Status: DC

## 2015-03-11 MED ORDER — GABAPENTIN 600 MG PO TABS
600.0000 mg | ORAL_TABLET | Freq: Three times a day (TID) | ORAL | Status: DC
  Administered 2015-03-11 (×2): 600 mg via ORAL
  Filled 2015-03-11 (×2): qty 1

## 2015-03-11 MED ORDER — AMITRIPTYLINE HCL 50 MG PO TABS
150.0000 mg | ORAL_TABLET | Freq: Every day | ORAL | Status: DC
  Administered 2015-03-11 – 2015-03-18 (×8): 150 mg via ORAL
  Filled 2015-03-11 (×10): qty 3

## 2015-03-11 MED ORDER — ESCITALOPRAM OXALATE 10 MG PO TABS
20.0000 mg | ORAL_TABLET | Freq: Every day | ORAL | Status: DC
  Administered 2015-03-11: 20 mg via ORAL
  Filled 2015-03-11: qty 2

## 2015-03-11 MED ORDER — OXYCODONE-ACETAMINOPHEN 5-325 MG PO TABS
2.0000 | ORAL_TABLET | Freq: Three times a day (TID) | ORAL | Status: DC | PRN
  Administered 2015-03-12: 2 via ORAL
  Filled 2015-03-11: qty 2

## 2015-03-11 MED ORDER — MAGNESIUM HYDROXIDE 400 MG/5ML PO SUSP
30.0000 mL | Freq: Every day | ORAL | Status: DC | PRN
Start: 2015-03-11 — End: 2015-03-19

## 2015-03-11 MED ORDER — ALUM & MAG HYDROXIDE-SIMETH 200-200-20 MG/5ML PO SUSP
30.0000 mL | ORAL | Status: DC | PRN
Start: 2015-03-11 — End: 2015-03-19

## 2015-03-11 MED ORDER — OXYCODONE-ACETAMINOPHEN 5-325 MG PO TABS
2.0000 | ORAL_TABLET | Freq: Three times a day (TID) | ORAL | Status: DC | PRN
  Administered 2015-03-11: 2 via ORAL
  Filled 2015-03-11: qty 2

## 2015-03-11 MED ORDER — ESCITALOPRAM OXALATE 10 MG PO TABS
20.0000 mg | ORAL_TABLET | Freq: Every day | ORAL | Status: DC
  Administered 2015-03-11 – 2015-03-19 (×9): 20 mg via ORAL
  Filled 2015-03-11 (×10): qty 2

## 2015-03-11 MED ORDER — TRAZODONE HCL 50 MG PO TABS
150.0000 mg | ORAL_TABLET | Freq: Every day | ORAL | Status: DC
  Administered 2015-03-11 – 2015-03-12 (×2): 150 mg via ORAL
  Filled 2015-03-11 (×2): qty 1

## 2015-03-11 MED ORDER — ACETAMINOPHEN 325 MG PO TABS
650.0000 mg | ORAL_TABLET | Freq: Four times a day (QID) | ORAL | Status: DC | PRN
  Administered 2015-03-19: 650 mg via ORAL
  Filled 2015-03-11: qty 2

## 2015-03-11 MED ORDER — AMITRIPTYLINE HCL 75 MG PO TABS
150.0000 mg | ORAL_TABLET | Freq: Every day | ORAL | Status: DC
  Filled 2015-03-11: qty 2

## 2015-03-11 MED ORDER — NICOTINE 21 MG/24HR TD PT24
21.0000 mg | MEDICATED_PATCH | Freq: Every day | TRANSDERMAL | Status: DC
  Administered 2015-03-12 – 2015-03-19 (×8): 21 mg via TRANSDERMAL
  Filled 2015-03-11 (×8): qty 1

## 2015-03-11 MED ORDER — OXYCODONE-ACETAMINOPHEN 5-325 MG PO TABS
1.0000 | ORAL_TABLET | Freq: Three times a day (TID) | ORAL | Status: DC | PRN
  Administered 2015-03-11: 1 via ORAL
  Filled 2015-03-11: qty 1

## 2015-03-11 MED ORDER — OXYCODONE HCL 5 MG PO TABS: 5.0000 mg | ORAL_TABLET | Freq: Three times a day (TID) | ORAL | Status: DC | PRN

## 2015-03-11 MED ORDER — OXYCODONE-ACETAMINOPHEN 10-325 MG PO TABS: 1.0000 | ORAL_TABLET | Freq: Three times a day (TID) | ORAL | Status: DC | PRN

## 2015-03-11 MED ORDER — IBUPROFEN 800 MG PO TABS
800.0000 mg | ORAL_TABLET | Freq: Four times a day (QID) | ORAL | Status: DC | PRN
  Administered 2015-03-13 – 2015-03-19 (×15): 800 mg via ORAL
  Filled 2015-03-11 (×15): qty 1

## 2015-03-11 NOTE — ED Notes (Signed)
Report given to Costa Ricajeanina

## 2015-03-11 NOTE — Progress Notes (Signed)
Patient new admit to unit and denies SI/HI at this time. Depressed affect, quiet speech, poor eye contact. Ambulates with slow steady gait. Skin check performed with no wounds or bruises noted. Skin check performed with no contraband found. Patient reports breakup with "wife of 30 years" has led to him feeling this bad. Reports chronic back ache relieved with Percocet 10 mg po prn reportedly given [facility]. Safety maintained at this time. Dinner tray ordered.

## 2015-03-11 NOTE — Progress Notes (Signed)
[facility] Dr. Toni Amendlapacs.  Attending Physician will be Dr. Jennet MaduroPucilowska.   Patient has been [facility] to room 323, [facility] Charge Nurse Victorino DikeJennifer.   Intake Paper Work has been signed and placed on patient chart.  ER staff is aware of the admission Misty Stanley( [facility] Sect.; Dr. Lenard LancePaduchowski , ER MD; Kasandra KnudsenKarena Patient's Nurse & Rutherford NailIsabel Patient Access).   03/11/2015 Cheryl FlashNicole Sury Wentworth, MS, NCC, LPCA Therapeutic Triage Specialist

## 2015-03-11 NOTE — ED Notes (Signed)
BEHAVIORAL HEALTH ROUNDING Patient sleeping: No. Patient alert and oriented: yes Behavior appropriate: Yes.  ; If no, describe:  Nutrition and fluids offered: Yes  Toileting and hygiene offered: Yes  Sitter present: yes Law enforcement present: Yes  

## 2015-03-11 NOTE — ED Notes (Signed)
MD at bedside. 

## 2015-03-11 NOTE — ED Provider Notes (Signed)
-----------------------------------------   1:06 PM on 03/11/2015 -----------------------------------------  Patient has been seen and evaluated by psychiatry, we will admit the patient for further treatment.  Minna AntisKevin Dustina Scoggin, MD 03/11/15 1306

## 2015-03-11 NOTE — ED Notes (Signed)
Patient asleep in room. No noted distress or abnormal behavior. Will continue 15 minute checks and observation by security cameras for safety. 

## 2015-03-11 NOTE — Progress Notes (Signed)
Writer to hold Gabapentin 600 mg po 1800 as previous dose was administered close to 1600.

## 2015-03-11 NOTE — ED Notes (Signed)
Patient assigned to appropriate care area based on presenting need for behavioral health evaluation. Patient oriented to unit/care area by nursing staff. Patient informed that care areas are designed for safety and monitored by security cameras at all times in order to promote and sure safety for both them and the staff assigned to their care. Visiting hours, phone use, daily routines, meal/snack schedule explained in detail to patient. Patient verbalized understanding of the instructions and information provided to them by nursing staff and was given the opportunity to ask questions related to their individualized plan of care as it stands at this time. Patient provided a verbal contract for safety to this RN and agrees to promptly notify a staff member should any changes occur that would lead to them experiencing thoughts of harming themselves or anyone else. 

## 2015-03-11 NOTE — ED Notes (Signed)
ED BHU PLACEMENT JUSTIFICATION Is the patient under IVC or is there intent for IVC: No   Is the patient medically cleared: Yes.   Is there vacancy in the ED BHU: Yes.   Is the population mix appropriate for patient: Yes.   Is the patient awaiting placement in inpatient or outpatient setting: Unable to assess at this time, pt is to see psychiatry in the morning.   Has the patient had a psychiatric consult: No.   Survey of unit performed for contraband, proper placement and condition of furniture, tampering with fixtures in bathroom, shower, and each patient room: Yes.  ; Findings:  APPEARANCE/BEHAVIOR Calm and cooperative. NEURO ASSESSMENT Orientation: oriented x3 Hallucinations: No.None noted (Hallucinations) Speech: Normal Gait: normal RESPIRATORY ASSESSMENT Respirations even and unlabored noted. CARDIOVASCULAR ASSESSMENT regular rate, pulses equal, skin warm and dry. GASTROINTESTINAL ASSESSMENT No GI complaints at this time. EXTREMITIES Full ROM PLAN OF CARE Provide calm/safe environment. Vital signs assessed twice daily. ED BHU Assessment once each 12-hour shift. Collaborate with intake RN daily or as condition indicates. Assure the ED provider has rounded once each shift. Provide and encourage hygiene. Provide redirection as needed. Assess for escalating behavior; address immediately and inform ED provider.  Assess family dynamic and appropriateness for visitation as needed: Yes.  ; If necessary, describe findings:  Educate the patient/family about BHU procedures/visitation: Yes.  ; If necessary, describe findings:

## 2015-03-11 NOTE — Progress Notes (Signed)
Observed snoring in bed, spontaneous eyes opening but quickly bad to sleep, A&Ox3, c/o 8/10 back pain, "when is the bedtime medications?". Will continue with POC.
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
## 2015-03-11 NOTE — Tx Team (Signed)
Initial Interdisciplinary Treatment Plan   PATIENT STRESSORS: Health problems Loss of Marriage of 30 years    PATIENT STRENGTHS:  sense of humor and ability to live independently     PROBLEM LIST: Problem List/Patient Goals Date to be addressed Date deferred Reason deferred Estimated date of resolution  Depression  03/11/15            Suicidal/Homicidal Ideas 03/11/15                                          DISCHARGE CRITERIA:  Improved stabilization in mood, thinking, and/or behavior Motivation to continue treatment in a less acute level of care Reduction of life-threatening or endangering symptoms to within safe limits  PRELIMINARY DISCHARGE PLAN: Attend aftercare/continuing care group Return to previous living arrangement  PATIENT/FAMIILY INVOLVEMENT: This treatment plan has been presented to and reviewed with the patient, Jobe MarkerRodney W Ellwood, and/or family member, .  The patient and family have been given the opportunity to ask questions and make suggestions.  Ignacia FellingJennifer A Dominick Morella 03/11/2015, 5:01 PM

## 2015-03-11 NOTE — Consult Note (Signed)
[facility] Consult   Reason for Consult:  Consult for this 50 year old man with a history of recurrent episodes of depression comes to the hospital complaining of suicidal thoughts [place] Referring Physician:  Corky Downs Patient Identification: Daniel [name] MRN:  [id number] Principal Diagnosis: Severe recurrent major depression without psychotic features [facility]) Diagnosis:   Patient Active Problem List   Diagnosis Date Noted  . Noncompliance with treatment plan [Z91.11] 03/11/2015  . Severe recurrent major depression without psychotic features ([place]) [F33.2] 02/24/2015  . Suicidal ideation [R45.851] 02/24/2015  . Chronic pain [G89.29] 02/24/2015  . Cough [R05] 02/24/2015  . Opioid use disorder, severe, dependence ([place]) [F11.20] 02/24/2015  . Hypertension [I10] 08/02/2014  . Gastric reflux [K21.9] 08/02/2014    Total Time spent with patient: 1 hour  Subjective:   Daniel [name] is a 50 y.o. male patient admitted with patient interviewed. Chart reviewed. Old notes reviewed labs reviewed and vital signs reviewed. Patient states that his mood is "terrible"..  HPI:  Patient states that his mood is terrible and that he is been feeling severely depressed with worsening mood over the last 2 weeks. He says after he left the hospital from his previous admission he has stayed on his medicine but has just felt more and more down. His mood feels depressed and hopeless all the time. He sleeps poorly and has nightmares frequently. He admits that he has not followed up with outpatient treatment at [facility] although he says he has taken his medicine. He says he ruminates constantly about his relationship with his ex-wife. He has started to have nightmares about killing his ex-wife and then killing himself. He has started to have fears during the day that he will act on it although it doesn't sound like that is necessarily his intent. He does feel paranoid and anxious but denies any hallucinations. He  claims that he has not been drinking or abusing drugs. Major stresses his being single and his depression about the end of his marriage even though that happened years ago. He continues to have chronic pain as well.  Social history: Patient lives by himself in a boarding house. Doesn't work. Barely gets up during the day never goes out to do very much except occasionally to help out his ex-father-in-law around the house.  Medical history: Long-standing chronic back pain and myalgias. Takes standard narcotics on a regular basis. Has a history of high blood pressure and acid reflux.  Substance abuse history: Distant history of alcohol abuse says that he has not been drinking for several years. He has had times in the past where concerns were raised about overusing his prescription medicine and he frequently seems to be "drug seeking" but he denies having an abuse problem.  Past Psychiatric History: Patient has had multiple psychiatric hospitalizations multiple episodes of depression. Tends to not comply with recommended outpatient treatment. Tends to decompensate quickly after being in the hospital. He has had hospital stays in the past. Has had some suicidal like behavior in the past but has not come close to killing himself.  Risk to Self: Is patient at risk for suicide?: Yes Risk to Others:   Prior Inpatient Therapy:   Prior Outpatient Therapy:    Past Medical History:  Past Medical History  Diagnosis Date  . Hypertension   . Bipolar 1 disorder ([place])   . Chronic pain   . Anxiety     Past Surgical History  Procedure Laterality Date  . Knee surgery Left  Family History: No family history on file. Family Psychiatric  History: Patient states that his father had antisocial behavior but he doesn't know of any specific diagnosis. There is no family history of suicide. He says that a very large number of people in his family have drug and alcohol problems Social History:  History  Alcohol  Use No     History  Drug Use No    Social History   Social History  . Marital Status: Divorced    Spouse Name: N/A  . Number of Children: N/A  . Years of Education: N/A   Social History Main Topics  . Smoking status: Former Research scientist (life sciences)  . Smokeless tobacco: Never Used  . Alcohol Use: No  . Drug Use: No  . Sexual Activity: Not Asked   Other Topics Concern  . None   Social History Narrative   Additional Social History:                          Allergies:   Allergies  Allergen Reactions  . Hydrocodone-Acetaminophen Hives    Vicodin/Norco    Labs:  Results for orders placed or performed during the hospital encounter of 03/11/15 (from the past 48 hour(s))  Comprehensive metabolic panel     Status: Abnormal   Collection Time: 03/11/15  4:05 AM  Result Value Ref Range   Sodium 137 135 - 145 mmol/L   Potassium 3.7 3.5 - 5.1 mmol/L   Chloride 103 101 - 111 mmol/L   CO2 28 22 - 32 mmol/L   Glucose, Bld 101 (H) 65 - 99 mg/dL   BUN 10 6 - 20 mg/dL   Creatinine, Ser 0.86 0.61 - 1.24 mg/dL   Calcium 9.0 8.9 - 10.3 mg/dL   Total Protein 7.3 6.5 - 8.1 g/dL   Albumin 4.4 3.5 - 5.0 g/dL   AST 24 15 - 41 U/L   ALT 26 17 - 63 U/L   Alkaline Phosphatase 62 38 - 126 U/L   Total Bilirubin 0.5 0.3 - 1.2 mg/dL   GFR calc non Af Amer >60 >60 mL/min   GFR calc Af Amer >60 >60 mL/min    Comment: (NOTE) The eGFR has been calculated using the CKD EPI equation. This calculation has not been validated in all clinical situations. eGFR's persistently <60 mL/min signify possible Chronic Kidney Disease.    Anion gap 6 5 - 15  Ethanol (ETOH)     Status: None   Collection Time: 03/11/15  4:05 AM  Result Value Ref Range   Alcohol, Ethyl (B) <5 <5 mg/dL    Comment:        LOWEST DETECTABLE LIMIT FOR SERUM ALCOHOL IS 5 mg/dL FOR MEDICAL PURPOSES ONLY   Salicylate level     Status: None   Collection Time: 03/11/15  4:05 AM  Result Value Ref Range   Salicylate Lvl <2.5 2.8 - 30.0  mg/dL  Acetaminophen level     Status: Abnormal   Collection Time: 03/11/15  4:05 AM  Result Value Ref Range   Acetaminophen (Tylenol), Serum <10 (L) 10 - 30 ug/mL    Comment:        THERAPEUTIC CONCENTRATIONS VARY SIGNIFICANTLY. A RANGE OF 10-30 ug/mL MAY BE AN EFFECTIVE CONCENTRATION FOR MANY PATIENTS. HOWEVER, SOME ARE BEST TREATED AT CONCENTRATIONS OUTSIDE THIS RANGE. ACETAMINOPHEN CONCENTRATIONS >150 ug/mL AT 4 HOURS AFTER INGESTION AND >50 ug/mL AT 12 HOURS AFTER INGESTION ARE OFTEN ASSOCIATED WITH TOXIC REACTIONS.  CBC     Status: None   Collection Time: 03/11/15  4:05 AM  Result Value Ref Range   WBC 5.7 3.8 - 10.6 K/uL   RBC 4.60 4.40 - 5.90 MIL/uL   Hemoglobin 13.8 13.0 - 18.0 g/dL   HCT 40.9 40.0 - 52.0 %   MCV 89.0 80.0 - 100.0 fL   MCH 30.1 26.0 - 34.0 pg   MCHC 33.8 32.0 - 36.0 g/dL   RDW 13.5 11.5 - 14.5 %   Platelets 237 150 - 440 K/uL    Current Facility-Administered Medications  Medication Dose Route Frequency Provider Last Rate Last Dose  . amitriptyline (ELAVIL) tablet 150 mg  150 mg Oral QHS Loney Hering, MD      . escitalopram (LEXAPRO) tablet 20 mg  20 mg Oral Daily Loney Hering, MD   20 mg at 03/11/15 1023  . gabapentin (NEURONTIN) tablet 600 mg  600 mg Oral TID Loney Hering, MD   600 mg at 03/11/15 1023  . ibuprofen (ADVIL,MOTRIN) tablet 800 mg  800 mg Oral Q8H PRN Loney Hering, MD      . oxyCODONE-acetaminophen (PERCOCET/ROXICET) 5-325 MG per tablet 2 tablet  2 tablet Oral Q8H PRN Gonzella Lex, MD      . traZODone (DESYREL) tablet 150 mg  150 mg Oral QHS Loney Hering, MD       Current Outpatient Prescriptions  Medication Sig Dispense Refill  . amitriptyline (ELAVIL) 150 MG tablet Take 1 tablet (150 mg total) by mouth at bedtime. 30 tablet 0  . azithromycin (ZITHROMAX) 250 MG tablet Take 1 tab orally daily. 6 each 0  . escitalopram (LEXAPRO) 20 MG tablet Take 1 tablet (20 mg total) by mouth daily. 30 tablet 0  .  gabapentin (NEURONTIN) 600 MG tablet Take 1 tablet (600 mg total) by mouth 3 (three) times daily. 90 tablet 0  . ibuprofen (ADVIL,MOTRIN) 800 MG tablet Take 800 mg by mouth every 8 (eight) hours as needed for moderate pain.     Marland Kitchen oxyCODONE-acetaminophen (PERCOCET) 10-325 MG tablet Take 1 tablet by mouth every 8 (eight) hours as needed for pain. 36 tablet 0  . traZODone (DESYREL) 150 MG tablet Take 1 tablet (150 mg total) by mouth at bedtime. 30 tablet 0    Musculoskeletal: Strength & Muscle Tone: decreased Gait & Station: unsteady Patient leans: N/A  Psychiatric Specialty Exam: Review of Systems  Constitutional: Negative.   HENT: Negative.   Eyes: Negative.   Respiratory: Negative.   Cardiovascular: Negative.   Gastrointestinal: Negative.   Musculoskeletal: Positive for myalgias and back pain.  Skin: Negative.   Neurological: Negative.   Psychiatric/Behavioral: Positive for depression and suicidal ideas. Negative for hallucinations, memory loss and substance abuse. The patient is nervous/anxious. The patient does not have insomnia.     Blood pressure 126/97, pulse 103, temperature 98.8 F (37.1 C), resp. rate 18, height 6' (1.829 m), weight 124.739 kg (275 lb), SpO2 95 %.Body mass index is 37.29 kg/(m^2).  General Appearance: Disheveled  Eye Sport and exercise psychologist::  Fair  Speech:  Slow  Volume:  Decreased  Mood:  Depressed  Affect:  Depressed  Thought Process:  Goal Directed  Orientation:  Full (Time, Place, and Person)  Thought Content:  Paranoid Ideation  Suicidal Thoughts:  Yes.  with intent/plan  Homicidal Thoughts:  Yes.  with intent/plan  Memory:  Immediate;   Fair Recent;   Fair Remote;   Fair  Judgement:  Impaired  Insight:  Shallow  Psychomotor Activity:  Decreased  Concentration:  Fair  Recall:  [employer] of Knowledge:Fair  Language: Fair  Akathisia:  No  Handed:  Right  AIMS (if indicated):     Assets:  Desire for Improvement Housing Resilience  ADL's:  Intact   Cognition: WNL  Sleep:      Treatment Plan Summary: Daily contact with patient to assess and evaluate symptoms and progress in treatment, Medication management and Plan 50 year old man with recurrent severe depression is once again reporting suicidal thoughts with thoughts of getting a gun and shooting himself and shooting his ex-wife. It's unclear from his description of that how serious the risk is about this although when I press him he claims that he had actually started to make some efforts towards buying a gun sometime recently. He seems very depressed and down. Patient will be admitted to the psychiatric ward. Continue 15 minute precautions. Continue involuntary commitment. Continue his currently prescribed outpatient psychiatric medications including the oxycodone as a when necessary. Defer blood pressure medicine until a better read can be gotten on is whether he needs it. Supportive counseling completed. Continues to have suicidal behavior required or ideation requiring 15 minute checks. Depression we treated with medication and daily therapy. The issue of noncompliance needs to be treated and addressed by the treatment team with some kind of assurance that he will follow-up with outpatient treatment.  Disposition: Recommend psychiatric Inpatient admission when medically cleared. Supportive therapy provided about ongoing stressors.  Josilynn Losh 03/11/2015 12:44 PM

## 2015-03-11 NOTE — ED Notes (Signed)
Pt pacing treatment room and dayroom, [employer] officer in to redirect pt to stay in treatment room at this time, pt verbalized understanding. Pt laying bed watching TV at this. NAD noted, will continue to monitor.

## 2015-03-11 NOTE — ED Notes (Addendum)
Pt to xray accompanied by police officer and xray tech

## 2015-03-11 NOTE — ED Notes (Signed)
Pt at door requesting medications again, this RN informed pt MD will be notified as soon as possible of pt request. Pt returned back to room. NAD noted at this time.

## 2015-03-11 NOTE — ED Notes (Addendum)
Upon entry to Behavioral Unit pt requesting "pain medication, gabapentin, and lexapro, I take that every morning" Pt informed MD will be notified of medications pt is requesing. Awaiting orders at this time. Pt verbalized understanding to contract for safety.

## 2015-03-11 NOTE — ED Notes (Signed)
ENVIRONMENTAL ASSESSMENT Potentially harmful objects out of patient reach: Yes Personal belongings secured: Yes Patient dressed in hospital provided attire only: Yes Plastic bags out of patient reach: Yes Patient care equipment (cords, cables, call bells, lines, and drains) shortened, removed, or accounted for: Yes Equipment and supplies removed from bottom of stretcher: Yes Potentially toxic materials out of patient reach: Yes Sharps container removed or out of patient reach: Yes  Patient in bed eating breakfast. Patient shows no signs of acute distress. Will continue to monitor. Maintained on 15 minute checks and observation by security camera for safety.

## 2015-03-11 NOTE — ED Notes (Signed)
Patient is sleeping soundly at the moment showing no signs of pain. Will continue to monitor . Maintained on 15 minute checks and observation by security camera for safety.

## 2015-03-11 NOTE — ED Notes (Signed)
Patient currently denies SI/HI/and AVH. Patient reports chronic pain rated  9/10, nurse gave patient medication before transport. All belongings returned to patient.

## 2015-03-11 NOTE — ED Notes (Signed)
Patient currently eating lunch. No signs of distress noted. Maintained on 15 minute checks and observation by security camera for safety.

## 2015-03-11 NOTE — ED Provider Notes (Signed)
[facility] Emergency Department Provider Note  ____________________________________________  Time seen: Approximately 417 AM  I have reviewed the triage vital signs and the nursing notes.   HISTORY  Chief Complaint Psychiatric Evaluation    HPI Daniel [name] is a 50 y.o. male who comes into the hospital today with some symptoms of depression. The patient reports that his depression feels very bad although he has been trying to keep a positive attitude. He reports that his life is screwed and he no longer wants to be here. He repeats that he wishes that he could be gone and wishes he could end it all. The patient was seen here on the 26th after having similar symptoms and reports that after he left he has been reading daily books and tried to get in with [facility]. He reports that he called them but no one called him back and then he also called [facility] Garfinkel and no one called him back. He reports he fell back into a rut where he keeps sitting by himself with his cat. He reports that he feels as though no one misses him and no one cares for him. He is all alone and he does not know what to do so he decided to come in tonight for evaluation and treatment.The patient reports that the other day he was walking down the stairs of his boarding house and he fell and hit his back.   Past Medical History  Diagnosis Date  . Hypertension   . Bipolar 1 disorder (HCC)   . Chronic pain   . Anxiety     Patient Active Problem List   Diagnosis Date Noted  . Severe recurrent major depression without psychotic features (HCC) 02/24/2015  . Suicidal ideation 02/24/2015  . Chronic pain 02/24/2015  . Cough 02/24/2015  . Opioid use disorder, severe, dependence ([facility]) 02/24/2015  . Hypertension 08/02/2014  . Gastric reflux 08/02/2014    Past Surgical History  Procedure Laterality Date  . Knee surgery Left     Current Outpatient Rx  Name  Route  Sig  Dispense  Refill  .  amitriptyline (ELAVIL) 150 MG tablet   Oral   Take 1 tablet (150 mg total) by mouth at bedtime.   30 tablet   0   . azithromycin (ZITHROMAX) 250 MG tablet      Take 1 tab orally daily.   6 each   0   . escitalopram (LEXAPRO) 20 MG tablet   Oral   Take 1 tablet (20 mg total) by mouth daily.   30 tablet   0   . gabapentin (NEURONTIN) 600 MG tablet   Oral   Take 1 tablet (600 mg total) by mouth 3 (three) times daily.   90 tablet   0   . ibuprofen (ADVIL,MOTRIN) 800 MG tablet   Oral   Take 800 mg by mouth every 8 (eight) hours as needed for moderate pain.          Marland Kitchen oxyCODONE-acetaminophen (PERCOCET) 10-325 MG tablet   Oral   Take 1 tablet by mouth every 8 (eight) hours as needed for pain.   36 tablet   0   . traZODone (DESYREL) 150 MG tablet   Oral   Take 1 tablet (150 mg total) by mouth at bedtime.   30 tablet   0     Allergies Hydrocodone-acetaminophen  No family history on file.  Social History Social History  Substance Use Topics  . Smoking status:  Former Smoker  . Smokeless tobacco: Never Used  . Alcohol Use: No    Review of Systems Constitutional: No fever/chills Eyes: No visual changes. ENT: No sore throat. Cardiovascular: Denies chest pain. Respiratory: Denies shortness of breath. Gastrointestinal: No abdominal pain.  No nausea, no vomiting.  No diarrhea.  No constipation. Genitourinary: Negative for dysuria. Musculoskeletal:  back pain. Skin: Negative for rash. Neurological: Negative for headaches, focal weakness or numbness. Psych: Depression and suicidal ideation  10-point ROS otherwise negative.  ____________________________________________   PHYSICAL EXAM:  VITAL SIGNS: ED Triage Vitals  Enc Vitals Group     BP 03/11/15 0356 126/97 mmHg     Pulse Rate 03/11/15 0356 103     Resp 03/11/15 0356 18     Temp 03/11/15 0356 98.8 F (37.1 C)     Temp src --      SpO2 03/11/15 0356 95 %     Weight 03/11/15 0356 275 lb (124.739  kg)     Height 03/11/15 0356 6' (1.829 m)     Head Cir --      Peak Flow --      Pain Score 03/11/15 0356 8     Pain Loc --      Pain Edu? --      Excl. in GC? --     Constitutional: Alert and oriented. Well appearing and in mild to moderate distress. Eyes: Conjunctivae are normal. PERRL. EOMI. Head: Atraumatic. Nose: No congestion/rhinnorhea. Mouth/Throat: Mucous membranes are moist.  Oropharynx non-erythematous. Cardiovascular: Normal rate, regular rhythm. Grossly normal heart sounds.  Good peripheral circulation. Respiratory: Normal respiratory effort.  No retractions. Lungs CTAB. Gastrointestinal: Soft and nontender. No distention. Positive bowel sounds Musculoskeletal: Tenderness to palpation low back   Neurologic:  Normal speech and language. No gross focal neurologic deficits are appreciated. No gait instability. Skin:  Skin is warm, dry and intact.  Psychiatric: Mood and affect are normal.   ____________________________________________   LABS (all labs ordered are listed, but only abnormal results are displayed)  Labs Reviewed  COMPREHENSIVE METABOLIC PANEL - Abnormal; Notable for the following:    Glucose, Bld 101 (*)    All other components within normal limits  ACETAMINOPHEN LEVEL - Abnormal; Notable for the following:    Acetaminophen (Tylenol), Serum <10 (*)    All other components within normal limits  ETHANOL  SALICYLATE LEVEL  CBC  URINE DRUG SCREEN, QUALITATIVE ([facility] ONLY)   ____________________________________________  EKG  None ____________________________________________  RADIOLOGY  Lumbar spine: no acute traumatic lumbar spine pathology, stable appearing T12 compression fracture ____________________________________________   PROCEDURES  Procedure(s) performed: None  Critical Care performed: No  ____________________________________________   INITIAL IMPRESSION / ASSESSMENT AND PLAN / ED COURSE  Pertinent labs & imaging results that  were available during my care of the patient were reviewed by me and considered in my medical decision making (see chart for details).  This is a 50 year old male who comes into the hospital today with feelings of depression and suicidal ideation. The patient continues to endorse that he wants to kill himself and his wife are leaving him. He was brought in by mobile crisis. I will involuntarily commit the patient and have him seen by psych. ____________________________________________   FINAL CLINICAL IMPRESSION(S) / ED DIAGNOSES  Final diagnoses:  Suicidal ideation      Rebecka ApleyAllison P Keishla Oyer, MD 03/11/15 [phone]

## 2015-03-12 DIAGNOSIS — F332 Major depressive disorder, recurrent severe without psychotic features: Principal | ICD-10-CM

## 2015-03-12 MED ORDER — OXYCODONE-ACETAMINOPHEN 5-325 MG PO TABS
2.0000 | ORAL_TABLET | Freq: Three times a day (TID) | ORAL | Status: DC
  Administered 2015-03-12 – 2015-03-17 (×15): 2 via ORAL
  Filled 2015-03-12 (×16): qty 2

## 2015-03-12 MED ORDER — LURASIDONE HCL 40 MG PO TABS
40.0000 mg | ORAL_TABLET | Freq: Every day | ORAL | Status: DC
  Administered 2015-03-12: 40 mg via ORAL
  Filled 2015-03-12: qty 1

## 2015-03-12 MED ORDER — OXYCODONE-ACETAMINOPHEN 5-325 MG PO TABS
2.0000 | ORAL_TABLET | Freq: Once | ORAL | Status: AC
  Administered 2015-03-12: 2 via ORAL
  Filled 2015-03-12: qty 2

## 2015-03-12 NOTE — [facility] Suicide Risk Assessment (Addendum)
[facility] [address] Admission Suicide Risk Assessment   Nursing information obtained from:    Demographic factors:    Current Mental Status:    Loss Factors:    Historical Factors:    Risk Reduction Factors:    Total Time spent with patient: 1 hour Principal Problem: Major depressive disorder, recurrent severe without psychotic features (HCC) Diagnosis:   Patient Active Problem List   Diagnosis Date Noted  . Noncompliance with treatment plan [Z91.11] 03/11/2015  . Major depressive disorder, recurrent severe without psychotic features (HCC) [F33.2] 03/11/2015  . Suicidal ideation [R45.851] 02/24/2015  . Chronic pain [G89.29] 02/24/2015  . Cough [R05] 02/24/2015  . Opioid use disorder, severe, dependence (HCC) [F11.20] 02/24/2015  . Hypertension [I10] 08/02/2014  . Gastric reflux [K21.9] 08/02/2014     Continued Clinical Symptoms:  Alcohol Use Disorder Identification Test Final Score (AUDIT): 0 The "Alcohol Use Disorders Identification Test", Guidelines for Use in Primary Care, Second Edition.  World Science writer [facility]). Score between 0-7:  no or low risk or alcohol related problems. Score between 8-15:  moderate risk of alcohol related problems. Score between 16-19:  high risk of alcohol related problems. Score 20 or above:  warrants further diagnostic evaluation for alcohol dependence and treatment.   CLINICAL FACTORS:   Depression:   Hopelessness Impulsivity Chronic Pain   Musculoskeletal: Strength & Muscle Tone: within normal limits Gait & Station: normal Patient leans: N/A  Psychiatric Specialty Exam: I reviewed physical exam performed in the emergency room and agree with the findings. Physical Exam  Nursing note and vitals reviewed.   Review of Systems  Musculoskeletal: Positive for back pain.  All other systems reviewed and are negative.   Blood pressure 143/90, pulse 92, temperature 98 F (36.7 C), temperature source Oral, resp. rate 20, height 6' (1.829 m), weight  125 kg (275 lb 9.2 oz), SpO2 96 %.Body mass index is 37.37 kg/(m^2).  General Appearance: Casual  Eye Contact::  Good  Speech:  Clear and Coherent  Volume:  Normal  Mood:  Depressed, Hopeless and Worthless  Affect:  Blunt  Thought Process:  Goal Directed  Orientation:  Full (Time, Place, and Person)  Thought Content:  WDL  Suicidal Thoughts:  Yes.  with intent/plan  Homicidal Thoughts:  Yes.  with intent/plan  Memory:  Immediate;   Fair Recent;   Fair Remote;   Fair  Judgement:  Fair  Insight:  Shallow  Psychomotor Activity:  Decreased  Concentration:  Fair  Recall:  [employer] of Knowledge:Fair  Language: Fair  Akathisia:  No  Handed:  Right  AIMS (if indicated):     Assets:  Communication Skills Desire for Improvement Financial Resources/Insurance Housing Resilience Social Support  Sleep:  Number of Hours: 8.15  Cognition: WNL  ADL's:  Intact     COGNITIVE FEATURES THAT CONTRIBUTE TO RISK:  None    SUICIDE RISK:   Moderate:  Frequent suicidal ideation with limited intensity, and duration, some specificity in terms of plans, no associated intent, good self-control, limited dysphoria/symptomatology, some risk factors present, and identifiable protective factors, including available and accessible social support.  PLAN OF CARE: Hospital admission, medication management, pain management, discharge planning.  Medical Decision Making:  New problem, with additional work up planned, Review of Psycho-Social Stressors (1), Review or order clinical lab tests (1), Review of Medication Regimen & Side Effects (2) and Review of New Medication or Change in Dosage (2)   Daniel [name] is a 50 year old male with history of depression, anxiety, mood  instability, chronic pain and suicidal ideation admitted for worsening of depression and suicidal and homicidal thinking.  1. Suicidal ideation. The patient is able to contract for safety in the hospital.   2. Mood. We will continue Lexapro  and Elavil for depression.  3. Chronic pain. He is in the care of pain clinic. We will continue Percocet and gabapentin.  4. Insomnia. He responded well to trazodone.  5. Smoking. Nicotine patch was available.  6. Disposition. He will be discharged back to his boarding house. He will follow up with his regular psychiatrist [facility]. He qualifies for community support team at this point.  I certify that inpatient services furnished can reasonably be expected to improve the patient's condition.   Daniel [name] 03/12/2015, 12:04 PM

## 2015-03-12 NOTE — Plan of Care (Signed)
Problem: Consults Goal: Suicide Risk Patient Education (See Patient Education module for education specifics)  Outcome: Progressing No SI/HI at this time. Patient remains depressed, encouraged to attend therapy groups to learn and initiate coping skills for management of stressors and diagnosis. Patient does use humor as coping skills. Noted to give positive support to male peer.

## 2015-03-12 NOTE — [facility] Group Notes (Signed)
[facility] LCSW Group Therapy  03/12/2015 5:41 PM  Type of Therapy:  Group Therapy  Participation Level:  Did Not Attend  Modes of Intervention:  Discussion, Education, Socialization and Support  Summary of Progress/Problems: Emotional Regulation: Patients will identify both negative and positive emotions. They will discuss emotions they have difficulty regulating and how they impact their lives. Patients will be asked to identify healthy coping skills to combat unhealthy reactions to negative emotions.     Aileena Iglesia L Nyree Yonker MSW, LCSWA  03/12/2015, 5:41 P 
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
## 2015-03-12 NOTE — Progress Notes (Signed)
Patient with depressed affect, cooperative behavior with meals, meds and plan of care. Encouraged to attend therapy groups to learn and initiate coping skills for management of stressors and diagnosis. Takes scheduled pain meds as ordered with good relief for chronic back pain. Noted to show positive support to male peer. Safety maintained.

## 2015-03-12 NOTE — Progress Notes (Signed)
Recreation Therapy Notes  Date: 01.11.17 Time: 3:00 pm Location: Craft Room  Group Topic: Self-esteem  Goal Area(s) Addresses:  Patient will write at least one positive trait. Patient will verbalize benefit of having a healthy self-esteem.  Behavioral Response: Did not attend  Intervention: I Am  Activity: Patients were given a worksheet with the letter I and instructed to write as many positive traits inside the letter as they could.  Education: LRT educated patients on ways they can increase their self-esteem.  Education Outcome: Patient did not attend group.  Clinical Observations/Feedback: Patient did not attend group.  Ileen Kahre M, LRT/CTRS 03/12/2015 4:41 PM 

## 2015-03-12 NOTE — Progress Notes (Signed)
Recreation Therapy Notes  INPATIENT RECREATION THERAPY ASSESSMENT  Patient Details Name: Jobe MarkerRodney W Ridgely MRN: [id number] DOB: [birth year] Today's Date: 03/12/2015  Patient Stressors: Family, Relationship, Death, Friends, Other (Comment) (Not a good relationship with family - "No family that I call family"; divorced 3 years ago; friend died recently; lack of supportive friends; "my life")  Coping Skills:   Isolate, Avoidance, Music  Personal Challenges: Anger, Communication, Concentration, Decision-Making, Expressing Yourself, Problem-Solving, Relationships, Self-Esteem/Confidence, Social Interaction, Stress Management, Time Management, Trusting Others  Leisure Interests (2+):  Music - Listen, Individual - TV  Awareness of Community Resources:  Yes  Community Resources:  [employer], Other (Comment) ([facility])  Current Use: No  If no, Barriers?: Other (Comment) (No reason)  Patient Strengths:  Nope  Patient Identified Areas of Improvement:  All of them  Current Recreation Participation:  Nothing  Patient Goal for Hospitalization:  Try to do better  [place]  County of Residence:  [place]   Current SI (including self-harm):  Yes  Current HI:  Yes  Consent to Intern Participation: N/A   [name],Allani Reber M, LRT/CTRS 03/12/2015, 5:30 PM

## 2015-03-12 NOTE — [facility] Group Notes (Signed)
[facility] LCSW Aftercare Discharge Planning Group Note   03/12/2015 4:40 PM  Participation Quality:  Did not attend   Nana Hoselton L Linton Stolp MSW, LCSWA  
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
## 2015-03-13 MED ORDER — CYCLOBENZAPRINE HCL 10 MG PO TABS
10.0000 mg | ORAL_TABLET | Freq: Every day | ORAL | Status: DC
  Administered 2015-03-13 – 2015-03-15 (×3): 10 mg via ORAL
  Filled 2015-03-13 (×4): qty 1

## 2015-03-13 MED ORDER — TRAZODONE HCL 100 MG PO TABS
200.0000 mg | ORAL_TABLET | Freq: Every day | ORAL | Status: DC
  Administered 2015-03-13 – 2015-03-18 (×6): 200 mg via ORAL
  Filled 2015-03-13 (×8): qty 2

## 2015-03-13 MED ORDER — GABAPENTIN 600 MG PO TABS
600.0000 mg | ORAL_TABLET | Freq: Three times a day (TID) | ORAL | Status: DC
  Administered 2015-03-13 – 2015-03-16 (×12): 600 mg via ORAL
  Filled 2015-03-13 (×13): qty 1

## 2015-03-13 MED ORDER — LURASIDONE HCL 40 MG PO TABS
80.0000 mg | ORAL_TABLET | Freq: Every day | ORAL | Status: DC
  Administered 2015-03-13 – 2015-03-18 (×6): 80 mg via ORAL
  Filled 2015-03-13 (×6): qty 2

## 2015-03-13 NOTE — [facility] Group Notes (Signed)
[facility] Group Notes:  (Nursing/MHT/Case Management/Adjunct)  Date:  03/13/2015  Time:  9:24 AM  Type of Therapy:  Community Meeting   Participation Level:  Did Not Attend   Skeeter Sheard De'Chelle Lavanna Rog 03/13/2015, 9:24 AM

## 2015-03-13 NOTE — Plan of Care (Signed)
Problem: Alteration in mood Goal: LTG-Patient reports reduction in suicidal thoughts (Patient reports reduction in suicidal thoughts and is able to verbalize a safety plan for whenever patient is feeling suicidal)  Outcome: Not Progressing Pt still reports SI, contracts for safety

## 2015-03-13 NOTE — [facility] Group Notes (Signed)
[facility] Group Notes:  (Nursing/MHT/Case Management/Adjunct)  Date:  03/13/2015  Time:  2:30 PM  Type of Therapy:  Psychoeducational Skills  Participation Level:  Did Not Attend    Calianne Larue M Moses Ellison 03/13/2015, 2:30 PM 
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
## 2015-03-13 NOTE — Progress Notes (Signed)
D:  Per pt self inventory pt reports sleeping poor, appetite good, energy level low, ability to pay attention poor, rates depression at a 9 out of 10, hopelessness at a 9 out of 10, anxiety at a 9 out of 10, denies HI/AVH, endorses SI almost all the time, verbal contract for safety, goal today: "focus, work program"     A:  Emotional support provided, Encouraged pt to continue with treatment plan and attend all group activities, q15 min checks maintained for safety.  R:  Pt is receptive, going to groups, pleasant and cooperative with staff and other patients on the unit, med seeking/needy at times.

## 2015-03-13 NOTE — Plan of Care (Signed)
Problem: [facility] Participation in Recreation Therapeutic Interventions Goal: STG-Patient will demonstrate improved self esteem by identif STG: Self-Esteem - Within 4 treatment sessions, patient will verbalize at least 5 positive affirmation statements in each of 2 treatment sessions to increase self-esteem post d/c.  Outcome: Progressing Treatment Session 1; Completed 1 out of 2: At approximately 12:50 pm, LRT met with patient in patient room. Patient verbalized 5 positive affirmation statements. Patient reported it felt "okay". LRT encouraged patient to continue saying positive affirmation statements. Intervention Used: I Am statements  Leonette Monarch, LRT/CTRS 01.12.17 2:16 pm Goal: STG-Other Recreation Therapy Goal (Specify) STG: Stress Management - Within 4 treatment sessions, patient will verbalize understanding of the stress management techniques in each of 2 treatment sessions to increase stress management skills post d/c.  Outcome: Progressing Treatment Session 1; Completed 1 out of 2: At approximately 12:50 pm, LRT met with patient in patient room. LRT educated and provided patient with handouts on stress management techniques. Patient verbalized understanding. LRT encouraged patient to read over and practice the stress management techniques. Intervention Used: Stress Management handouts  Leonette Monarch, LRT/CTRS 01.12.17 2:17 pm

## 2015-03-13 NOTE — Progress Notes (Signed)
D: Pt denies SI/HI/AVH. Pt is pleasant and cooperative. Pt stated he feels better from taking his pain medication,  he appears less anxious and he is interacting with peers and staff appropriately.  A: Pt was offered support and encouragement. Pt was given scheduled medications. Pt was encouraged to attend groups. Q 15 minute checks were done for safety.  R:Pt did not attend group. Pt is taking medication. Pt receptive to treatment and safety maintained on unit.

## 2015-03-13 NOTE — [facility] Group Notes (Signed)
[facility] Group Notes:  (Nursing/MHT/Case Management/Adjunct)  Date:  03/13/2015  Time:  1:27 AM  Type of Therapy:  Evening Wrap-up Group  Participation Level:  Did Not Attend  Participation Quality:  N/A  Affect:  N/A  Cognitive:  N/A  Insight:  None  Engagement in Group:  N/A  Modes of Intervention:  Discussion  Summary of Progress/Problems:  Tomasita MorrowChelsea Nanta Rodgerick Gilliand 03/13/2015, 1:27 AM
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
## 2015-03-13 NOTE — Plan of Care (Signed)
Problem: Diagnosis: Increased Risk For Suicide Attempt Goal: LTG-Patient Will Show Positive Response to Medication LTG (by discharge) : Patient will show positive response to medication and will participate in the development of the discharge plan.  Outcome: Progressing Patient participating in treatment plan and complaint with medication.

## 2015-03-13 NOTE — Progress Notes (Signed)
Recreation Therapy Notes  Date: 01.12.17 Time: 3:00 pm Location: Craft Room  Group Topic: Leisure Education/ Coping Skills  Goal Area(s) Addresses:  Patient will identify things they are grateful for. Patient will identify how being grateful can influence decision making.  Behavioral Response: Did not attend  Intervention: Grateful Wheel  Activity: Patients were given an "I Am Grateful For" worksheet and instructed to write at least one thing they are grateful for under each category.  Education: LRT educated patients on leisure and why it is important.  Education Outcome: Patient did not attend group.  Clinical Observations/Feedback: Patient did not attend group.  [name],Curry Dulski M, LRT/CTRS 03/13/2015 4:14 PM

## 2015-03-13 NOTE — [facility] Group Notes (Signed)
[facility] LCSW Group Therapy  03/13/2015 8:18 PM  Type of Therapy:  Group Therapy  Participation Level:  Did Not Attend  Modes of Intervention:  Discussion, Education, Socialization and Support  Summary of Progress/Problems: Balance in life: Patients will discuss the concept of balance and how it looks and feels to be unbalanced. Pt will identify areas in their life that is unbalanced and ways to become more balanced.    Yavonne Kiss L Ryver Zadrozny MSW, LCSWA  03/13/2015, 8:18 PM  

## 2015-03-14 NOTE — Plan of Care (Signed)
Problem: [facility] Participation in Recreation Therapeutic Interventions Goal: STG-Patient will demonstrate improved self esteem by identif STG: Self-Esteem - Within 4 treatment sessions, patient will verbalize at least 5 positive affirmation statements in each of 2 treatment sessions to increase self-esteem post d/c.  Outcome: Completed/Met Date Met:  03/14/15 Treatment Session 2; Completed 2 out of 2: At approximately 2:10 pm, LRT met with patient in patient room. Patient verbalized 5 positive affirmation statements. Patient reported it felt "pretty good". LRT encouraged patient to continue saying positive affirmation statements. Intervention Used: I Am statements  Leonette Monarch, LRT/CTRS 01.13.17 2:21 pm Goal: STG-Other Recreation Therapy Goal (Specify) STG: Stress Management - Within 4 treatment sessions, patient will verbalize understanding of the stress management techniques in each of 2 treatment sessions to increase stress management skills post d/c.  Outcome: Completed/Met Date Met:  03/14/15 Treatment Session 2; Completed 2 out of 2: At approximately 2:10 pm, LRT met with patient in patient room. Patient reported he looked over the stress management techniques. Patient verbalized understanding. LRT encouraged patient to practice the stress management techniques. Intervention Used: Stress Management handouts  Leonette Monarch, LRT/CTRS 01.13.17 2:22 pm

## 2015-03-14 NOTE — Progress Notes (Signed)
[facility]  03/14/2015 9:51 PM Jobe MarkerRodney W Lienemann  MRN:  [id number]  Subjective:  [name] shows very little improvement. His depression and anxiety are 8/8. He still has passing thoughts of suicide. He is not able to contract for safety in the community. He tolerates medications well. Complains of back pain.   Principal Problem: Major depressive disorder, recurrent severe without psychotic features (HCC) Diagnosis:   Patient Active Problem List   Diagnosis Date Noted  . Noncompliance with treatment plan [Z91.11] 03/11/2015  . Major depressive disorder, recurrent severe without psychotic features (HCC) [F33.2] 03/11/2015  . Suicidal ideation [R45.851] 02/24/2015  . Chronic pain [G89.29] 02/24/2015  . Cough [R05] 02/24/2015  . Opioid use disorder, severe, dependence (HCC) [F11.20] 02/24/2015  . Hypertension [I10] 08/02/2014  . Gastric reflux [K21.9] 08/02/2014   Total Time spent with patient: 20 minutes  Past Psychiatric History: depression, anxiety, mood instability, chronic pain.  Past Medical History:  Past Medical History  Diagnosis Date  . Hypertension   . Bipolar 1 disorder (HCC)   . Chronic pain   . Anxiety     Past Surgical History  Procedure Laterality Date  . Knee surgery Left    Family History: History reviewed. No pertinent family history. Family Psychiatric  History: none reported. Social History:  History  Alcohol Use No     History  Drug Use No    Social History   Social History  . Marital Status: Divorced    Spouse Name: N/A  . Number of Children: N/A  . Years of Education: N/A   Social History Main Topics  . Smoking status: Former Games developermoker  . Smokeless tobacco: Never Used  . Alcohol Use: No  . Drug Use: No  . Sexual Activity: Not Asked   Other Topics Concern  . None   Social History Narrative   Additional Social History:                         Sleep: Poor  Appetite:  Fair  Current Medications: Current  Facility-Administered Medications  Medication Dose Route Frequency Provider Last Rate Last Dose  . acetaminophen (TYLENOL) tablet 650 mg  650 mg Oral Q6H PRN Chistian Kasler B Thatiana Renbarger, MD      . alum & mag hydroxide-simeth (MAALOX/MYLANTA) 200-200-20 MG/5ML suspension 30 mL  30 mL Oral Q4H PRN Leasia Swann B Laurenashley Viar, MD      . amitriptyline (ELAVIL) tablet 150 mg  150 mg Oral QHS Shari ProwsJolanta B Suraj Ramdass, MD   150 mg at 03/13/15 2207  . cyclobenzaprine (FLEXERIL) tablet 10 mg  10 mg Oral QHS Shari ProwsJolanta B Welden Hausmann, MD   10 mg at 03/13/15 2207  . escitalopram (LEXAPRO) tablet 20 mg  20 mg Oral Daily Khaleem Burchill B Amreen Raczkowski, MD   20 mg at 03/14/15 0849  . gabapentin (NEURONTIN) tablet 600 mg  600 mg Oral TID AC & HS Curran Lenderman B Jolina Symonds, MD   600 mg at 03/14/15 1721  . ibuprofen (ADVIL,MOTRIN) tablet 800 mg  800 mg Oral Q6H PRN Shari ProwsJolanta B Atari Novick, MD   800 mg at 03/14/15 1721  . lurasidone (LATUDA) tablet 80 mg  80 mg Oral Q supper Shari ProwsJolanta B Adalae Baysinger, MD   80 mg at 03/14/15 1721  . magnesium hydroxide (MILK OF MAGNESIA) suspension 30 mL  30 mL Oral Daily PRN Leida Luton B Berel Najjar, MD      . nicotine (NICODERM CQ - dosed in mg/24 hours) patch 21 mg  21 mg  Transdermal Q0600 Shari Prows, MD   21 mg at 03/14/15 0650  . oxyCODONE-acetaminophen (PERCOCET/ROXICET) 5-325 MG per tablet 2 tablet  2 tablet Oral TID AC Shari Prows, MD   2 tablet at 03/14/15 1721  . traZODone (DESYREL) tablet 200 mg  200 mg Oral QHS Jemiah Cuadra B Stepehn Eckard, MD   200 mg at 03/13/15 2206    Lab Results: No results found for this or any previous visit (from the past 48 hour(s)).  Physical Findings: AIMS:  , ,  ,  ,    CIWA:    COWS:     Musculoskeletal: Strength & Muscle Tone: within normal limits Gait & Station: normal Patient leans: N/A  Psychiatric Specialty Exam: Review of Systems  Musculoskeletal: Positive for back pain.  All other systems reviewed and are negative.   Blood pressure 127/78, pulse 88,  temperature 97.6 F (36.4 C), temperature source Oral, resp. rate 20, height 6' (1.829 m), weight 125 kg (275 lb 9.2 oz), SpO2 96 %.Body mass index is 37.37 kg/(m^2).  General Appearance: Casual  Eye Contact::  Good  Speech:  Clear and Coherent  Volume:  Normal  Mood:  Depressed, Hopeless and Worthless  Affect:  Blunt  Thought Process:  Goal Directed  Orientation:  Full (Time, Place, and Person)  Thought Content:  WDL  Suicidal Thoughts:  Yes.  with intent/plan  Homicidal Thoughts:  Yes.  with intent/plan  Memory:  Immediate;   Fair Recent;   Fair Remote;   Fair  Judgement:  Fair  Insight:  Fair  Psychomotor Activity:  Decreased  Concentration:  Fair  Recall:  [employer] of Knowledge:Fair  Language: Fair  Akathisia:  No  Handed:  Right  AIMS (if indicated):     Assets:  Communication Skills Desire for Improvement Financial Resources/Insurance Housing Resilience  ADL's:  Intact  Cognition: WNL  Sleep:  Number of Hours: 7.75   Treatment Plan Summary: Daily contact with patient to assess and evaluate symptoms and progress in treatment and Medication management   Mr. Lalor is a 50 year old male with history of depression, anxiety, mood instability, chronic pain and suicidal ideation admitted for worsening of depression and suicidal and homicidal thinking.  1. Suicidal ideation. The patient is able to contract for safety in the hospital.   2. Mood. We continued Lexapro and Elavil for depression and started Latuda for mood stabilization.  3. Chronic pain. He is in the care of pain clinic. We will continue Percocet. We will increase gabapentin to 600 mg qid and add flexeril at night.   4. Insomnia. He is on trazodone. Will increase to 200 mg.  5. Smoking. Nicotine patch was available.  6. Disposition. He will be discharged back to his boarding house. He will follow up with his regular psychiatrist [facility]. He qualifies for community support team at this point already. We will  attempt to get ACT team.   Faith Patricelli 03/14/2015, 9:51 PM

## 2015-03-14 NOTE — Progress Notes (Signed)
Recreation Therapy Notes  Date: 01.13.17 Time: 3:15 pm Location: Craft Room  Group Topic: Communication, Problem Solving, Teamwork  Goal Area(s) Addresses:  Patient will effectively work with peer towards shared goal. Patient will identify skills used to make activity successful. Patient will identify benefit of using group skills effectively post d/c.  Behavioral Response: Did not attend   Intervention: Pipe Cleaner Tower  Activity: Patients were given 15 pipe cleaners and instructed to build a free standing tower. After about 6 minutes of them building, patients were instructed to put their dominant hand behind their backs.  Education: LRT educated patients on how they can use these skills outside of the hospital.  Education Outcome: Patient did not attend group.  Clinical Observations/Feedback: Patient did not attend group.  Broughton Eppinger M, LRT/CTRS 03/14/2015 4:23 PM 

## 2015-03-14 NOTE — Progress Notes (Signed)
Pleasant and cooperative.  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  Continues to endorse SI. Continues to have back pain, medicated according to orders.  Support and encouragement offered.  Safety maintained.

## 2015-03-14 NOTE — Plan of Care (Signed)
Problem: Diagnosis: Increased Risk For Suicide Attempt Goal: LTG-Patient Will Report Improved Mood and Deny Suicidal LTG (by discharge) Patient will report improved mood and deny suicidal ideation.  Outcome: Not Progressing Rates depression as a 8 and continues to verbalize thoughts of SI although passive while here.  Verbally contracts for safety.

## 2015-03-14 NOTE — [facility] Group Notes (Signed)
[facility] Group Notes:  (Nursing/MHT/Case Management/Adjunct)  Date:  03/14/2015  Time:  1:04 PM  Type of Therapy:  Movement Therapy  Participation Level:  Active  Participation Quality:  Appropriate  Affect:  Appropriate  Cognitive:  Alert, Appropriate and Oriented  Insight:  Appropriate  Engagement in Group:  Engaged  Modes of Intervention:  Activity  Summary of Progress/Problems:  Estes Lehner De'Chelle Alayzia Pavlock 03/14/2015, 1:04 PM

## 2015-03-14 NOTE — Progress Notes (Signed)
D: Pt denies SI/HI/AVH. Pt is pleasant and cooperative. Pt stated he feels better from taking his pain medication, he appears less anxious.  A: Pt was offered support and encouragement. Pt was given scheduled medications. Pt was encouraged to attend groups. Q 15 minute checks were done for safety.  R:Pt did not attend evening group and stayed in his room most of the shift.  Pt is compliant with medication. Pt has no complaints.Pt receptive to treatment and safety maintained on unit.

## 2015-03-14 NOTE — Plan of Care (Signed)
Problem: Alteration in mood Goal: LTG-Patient reports reduction in suicidal thoughts (Patient reports reduction in suicidal thoughts and is able to verbalize a safety plan for whenever patient is feeling suicidal)  Outcome: Progressing Patient denies SI/HI.      

## 2015-03-15 NOTE — [facility] Group Notes (Signed)
[facility] Group Notes:  (Nursing/MHT/Case Management/Adjunct)  Date:  03/15/2015  Time:  10:51 PM  Type of Therapy:  Group Therapy  Participation Level:  Active  Participation Quality:  Appropriate and Attentive  Affect:  Appropriate  Cognitive:  Appropriate  Insight:  Appropriate  Engagement in Group:  Engaged  Modes of Intervention:  Support  Summary of Progress/Problems:  Murrell ReddenOlivia Briana Morgan-Little 03/15/2015, 10:51 PM

## 2015-03-15 NOTE — Progress Notes (Signed)
D: Pt denies SI/HI/AVH. Pt is pleasant and cooperative with care, affect is flat and sad but brightens upon approach. Patient appears less anxious and he is interacting with peers and staff appropriately.  A: Pt was offered support and encouragement. Pt was given scheduled medications. Pt was encouraged to attend groups. Q 15 minute checks were done for safety.  R:Pt attends groups and interacts well with peers and staff. Pt is taking medication. Pt has no complaints.Pt receptive to treatment and safety maintained on unit.

## 2015-03-15 NOTE — Plan of Care (Signed)
Problem: Alteration in mood Goal: LTG-Patient reports reduction in suicidal thoughts (Patient reports reduction in suicidal thoughts and is able to verbalize a safety plan for whenever patient is feeling suicidal)  Outcome: Progressing Patient denies SI/HI.      
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
## 2015-03-15 NOTE — [facility] Group Notes (Signed)
[facility] LCSW Group Therapy  03/15/2015 3:35 PM   Type of Therapy:  Group Therapy  Participation Level:  Did Not Attend  Modes of Intervention:  Discussion, Education, Socialization and Support  Summary of Progress/Problems: Pt will identify unhealthy thoughts and how they impact their emotions and behavior. Pt will be encouraged to discuss these thoughts, emotions and behaviors with the group.   Jakhia Buxton L Mallerie Blok MSW, LCSWA  03/15/2015,3:35 PM  

## 2015-03-15 NOTE — Progress Notes (Signed)
Patient is pleasant and cooperative. He notes continued chronic pain, but states he is hopeful to have a surgery to fix the problem soon. He notes even the medications he gets does not ever alleviate the pain but he is able to function adequately. No other distress or needs were noted. He endorses depression but does deny current SI, HI, and AVH.

## 2015-03-15 NOTE — [facility] Group Notes (Signed)
[facility] Group Notes:  (Nursing/MHT/Case Management/Adjunct)  Date:  03/15/2015  Time:  10:50 AM  Type of Therapy:  Psychoeducational Skills  Participation Level:  Active  Participation Quality:  Appropriate  Affect:  Appropriate  Cognitive:  Appropriate  Insight:  Appropriate  Engagement in Group:  Engaged  Modes of Intervention:  Education  Summary of Progress/Problems:  Lorre MunroeClarence Melvin Jolyne Laye 03/15/2015, 10:50 AM

## 2015-03-15 NOTE — Plan of Care (Signed)
Problem: Alteration in mood Goal: LTG-Patient reports reduction in suicidal thoughts (Patient reports reduction in suicidal thoughts and is able to verbalize a safety plan for whenever patient is feeling suicidal)  Outcome: Progressing Denies  SI.  States "I'm not ready to die yet."

## 2015-03-15 NOTE — Progress Notes (Signed)
Continues to endorse depression. Rates his depression as a 8/10.   Denies SI.  Affect is bright.  Up and about on unit.  Playful with peers and staff.  Support and encouragement offered. Safety maintained.

## 2015-03-16 MED ORDER — PREGABALIN 50 MG PO CAPS
100.0000 mg | ORAL_CAPSULE | Freq: Two times a day (BID) | ORAL | Status: DC
Start: 2015-03-16 — End: 2015-03-19
  Administered 2015-03-16 – 2015-03-19 (×7): 100 mg via ORAL
  Filled 2015-03-16 (×7): qty 2

## 2015-03-16 MED ORDER — CYCLOBENZAPRINE HCL 10 MG PO TABS
10.0000 mg | ORAL_TABLET | Freq: Two times a day (BID) | ORAL | Status: DC | PRN
  Administered 2015-03-16 – 2015-03-19 (×7): 10 mg via ORAL
  Filled 2015-03-16 (×9): qty 1
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
## 2015-03-16 NOTE — Progress Notes (Signed)
Pleasant and cooperative;Marland Kitchen.   Denies SI. Rates depression as 7/10.  Medication and group compliant.  Up to dayroom watching TV.  Interacting with peers and staff appropriately.  Support and encouragement offered.  Safety maintained.

## 2015-03-16 NOTE — [facility] Group Notes (Signed)
[facility] LCSW Group Therapy  03/16/2015 5:41 PM  Type of Therapy:  Group Therapy  Participation Level:  Active  Participation Quality:  Appropriate  Affect:  Appropriate  Cognitive:  Alert  Insight:  Improving  Engagement in Therapy:  Improving  Modes of Intervention:  Discussion, Education, Socialization and Support  Summary of Progress/Problems: Todays topic: Grudges  Patients will be encouraged to discuss their thoughts, feelings, and behaviors as to why one holds on to grudges and reasons why people have grudges. Patients will process the impact of grudges on their daily lives and identify thoughts and feelings related to holding grudges. Patients will identify feelings and thoughts related to what life would look like without grudges.   Daniel [name] L Sharad Vaneaton MSW, LCSWA  03/16/2015, 5:41 PM   

## 2015-03-17 MED ORDER — PREGABALIN 100 MG PO CAPS: 100.0000 mg | ORAL_CAPSULE | Freq: Two times a day (BID) | ORAL | Status: DC

## 2015-03-17 MED ORDER — BUPRENORPHINE HCL 2 MG SL SUBL
4.0000 mg | SUBLINGUAL_TABLET | Freq: Every day | SUBLINGUAL | Status: DC
  Administered 2015-03-17 – 2015-03-19 (×3): 4 mg via SUBLINGUAL
  Filled 2015-03-17 (×3): qty 2

## 2015-03-17 MED ORDER — LURASIDONE HCL 80 MG PO TABS: 80.0000 mg | ORAL_TABLET | Freq: Every day | ORAL | Status: DC

## 2015-03-17 MED ORDER — CYCLOBENZAPRINE HCL 10 MG PO TABS: 10.0000 mg | ORAL_TABLET | Freq: Two times a day (BID) | ORAL | Status: DC | PRN

## 2015-03-17 NOTE — Progress Notes (Signed)
Patient is complaining of back pain of a "7"  Patient expresses distress that his Percocet was discontinued.  Patient states that he has a legitimate and current prescription for Percocet and has been prescribed this medication since 1988.  Patient asked if he could have his pharmacy fax a copy of his prescription to the [facility].  Patient was informed that he could talk to his Dr tomorrow regarding his concerns.

## 2015-03-17 NOTE — Plan of Care (Signed)
Problem: Alteration in mood Goal: LTG-Pt's behavior demonstrates decreased signs of depression (Patient's behavior demonstrates decreased signs of depression to the point the patient is safe to return home and continue treatment in an outpatient setting)  Outcome: Progressing Patient interacts well with peers on the unit and is animated and reports feeling less depressed

## 2015-03-17 NOTE — [facility] Suicide Risk Assessment (Addendum)
[facility]   Demographic Factors:  Male, Divorced or widowed, Caucasian, Low socioeconomic status and Living alone  Total Time spent with patient: 30 minutes  Musculoskeletal: Strength & Muscle Tone: within normal limits Gait & Station: normal Patient leans: N/A  Psychiatric Specialty Exam: Physical Exam  Nursing note and vitals reviewed.   Review of Systems  Musculoskeletal: Positive for back pain.  All other systems reviewed and are negative.   Blood pressure 127/79, pulse 82, temperature 98.7 F (37.1 C), temperature source Oral, resp. rate 20, height 6' (1.829 m), weight 125 kg (275 lb 9.2 oz), SpO2 96 %.Body mass index is 37.37 kg/(m^2).  General Appearance: Casual  Eye Contact::  Good  Speech:  Clear and Coherent409  Volume:  Normal  Mood:  Euthymic  Affect:  Appropriate  Thought Process:  Goal Directed  Orientation:  Full (Time, Place, and Person)  Thought Content:  WDL  Suicidal Thoughts:  No  Homicidal Thoughts:  No  Memory:  Immediate;   Fair Recent;   Fair Remote;   Fair  Judgement:  Fair  Insight:  Fair  Psychomotor Activity:  Normal  Concentration:  Fair  Recall:  F[employer]Fair  Fund of Knowledge:Fair  Language: Fair  Akathisia:  No  Handed:  Right  AIMS (if indicated):     Assets:  Communication Skills Desire for Improvement Financial Resources/Insurance Housing Resilience Social Support  Sleep:  Number of Hours: 6.5  Cognition: WNL  ADL's:  Intact   Have you used any form of tobacco in the last 30 days? (Cigarettes, Smokeless Tobacco, Cigars, and/or Pipes): Yes  Has this patient used any form of tobacco in the last 30 days? (Cigarettes, Smokeless Tobacco, Cigars, and/or Pipes) Yes, A prescription for an FDA-approved tobacco cessation medication was offered at discharge and the patient refused  Mental Status Per Nursing Assessment::   On Admission:     Current Mental Status by Physician: NA  Loss Factors: Loss of  significant relationship, Decline in physical health and Financial problems/change in socioeconomic status  Historical Factors: Prior suicide attempts and Impulsivity  Risk Reduction Factors:   Sense of responsibility to family, Positive social support and Positive therapeutic relationship  Continued Clinical Symptoms:  Depression:   Impulsivity  Cognitive Features That Contribute To Risk:  None    Suicide Risk:  Minimal: No identifiable suicidal ideation.  Patients presenting with no risk factors but with morbid ruminations; may be classified as minimal risk based on the severity of the depressive symptoms  Principal Problem: Major depressive disorder, recurrent severe without psychotic features [facility](HCC) Discharge Diagnoses:  Patient Active Problem List   Diagnosis Date Noted  . Noncompliance with treatment plan [Z91.11] 03/11/2015  . Major depressive disorder, recurrent severe without psychotic features (HCC) [F33.2] 03/11/2015  . Suicidal ideation [R45.851] 02/24/2015  . Chronic pain [G89.29] 02/24/2015  . Cough [R05] 02/24/2015  . Opioid use disorder, severe, dependence (HCC) [F11.20] 02/24/2015  . Hypertension [I10] 08/02/2014  . Gastric reflux [K21.9] 08/02/2014      Plan Of Care/Follow-up recommendations:  Activity:  As tolerated. Diet:  Low sodium heart healthy. Other:  Keep follow-up appointments.  Is patient on multiple antipsychotic therapies at discharge:  No   Has Patient had three or more failed trials of antipsychotic monotherapy by history:  No  Recommended Plan for Multiple Antipsychotic Therapies: NA    Jolanta Pucilowska 03/17/2015, 9:01 AM
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
## 2015-03-17 NOTE — Progress Notes (Signed)
Recreation Therapy Notes  Date: 01.16.17 Time: 3:00 pm Location: Craft Room  Group Topic: Wellness  Goal Area(s) Addresses:  Patient will identify at least one item per dimension of health. Patient will examine areas they are deficient in.  Behavioral Response: Attentive, Interactive  Intervention: 6 Dimensions of Health  Activity: Patients were given a worksheet with the definitions of the 6 dimensions of health. Patients were given a worksheet with the 6 dimensions of health listed and were encouraged to write 2-3 things they are currently doing in each category.  Education: LRT educated patients on how this activity is related to their admission and d/c.  Education Outcome: Acknowledges education/In group clarification offered   Clinical Observations/Feedback: Patient completed activity by writing at least 2 items in each category. Patient contributed to group discussion.  [name],Dorsey Authement M, LRT/CTRS 03/17/2015 4:47 PM

## 2015-03-17 NOTE — Progress Notes (Signed)
Patient was visible in the milieu, cooperative on approach, interacted well with peers and staff. He denies suicidal ideation on shift. No behavioral issues to report on shift at this time.

## 2015-03-17 NOTE — [facility] Suicide Risk Assessment (Signed)
[facility] INPATIENT:  Family/Significant Other Suicide Prevention Education  Suicide Prevention Education:  Family/Significant Other Refusal to Support Patient after Discharge:  Suicide Prevention Education Not Provided:  Patient has identified home of family/significant other as the place the patient will be residing after discharge.  With written consent of the patient, two attempts were made to provide Suicide Prevention Education to [name] (ex-mother-in-law) [phone].  This person indicates he/she will not be responsible for the patient after discharge but wants patient to get help and take care of himself first before they can support him.   [name], [name], MSW, LCSWA [phone] 03/17/2015,11:11 AM

## 2015-03-17 NOTE — [facility] Group Notes (Signed)
[facility] Group Notes:  (Nursing/MHT/Case Management/Adjunct)  Date:  03/17/2015  Time:  1:26 PM  Type of Therapy:  Group Therapy  Participation Level:  Active  Participation Quality:  Supportive  Affect:  Appropriate  Cognitive:  Appropriate  Insight:  Good  Engagement in Group:  Supportive  Modes of Intervention:  Support  Summary of Progress/Problems:  Daniel [name] 03/17/2015, 1:26 PM

## 2015-03-17 NOTE — Progress Notes (Signed)
D:  Patient is alert and oriented on the unit this shift.  Patient appears to be anxious currently.  Patient attends groups and actively participates this shift.  Patient is calm and cooperative on the unit this shift.  Patient denies suicidal ideation, homicidal ideation, auditory or visual hallucinations currently.   A:  Medications are administered as per MD orders.  Patient is encouraged to attend groups.  Q.15 minute safety checks are continued. R:  Patient is cooperative with medication administration.  No adverse reactions to medications were noted.  Patient contracts for safety on the unit.  Patient remains safe at this time.

## 2015-03-18 NOTE — [facility] Group Notes (Signed)
[facility] Group Notes:  (Nursing/MHT/Case Management/Adjunct)  Date:  03/18/2015  Time:  2:10 PM  Type of Therapy:  Psychoeducational Skills  Participation Level:  Active  Participation Quality:  Appropriate  Affect:  Appropriate  Cognitive:  Appropriate  Insight:  Appropriate  Engagement in Group:  Engaged  Modes of Intervention:  Discussion and Education  Summary of Progress/Problems:  Daniel [name] 03/18/2015, 2:10 PM

## 2015-03-18 NOTE — Progress Notes (Signed)
[facility] MD Progress Note  03/18/2015 3:37 PM BORDEN THUNE  MRN:  [id number]  Subjective:  Mr. Hartwell as well as morning and we plan on discharging him home. He was not suicidal or homicidal, excessively anxious or depressed. By midday he reported flashbacks of his wife who left him several years ago with thoughts of killing her. He is not able to contract for safety. We did an inquiry with his pharmacy to better understand his current standing with pain clinic. His social worker call the clinic to learn that the patient is still in good standing but has not seen a doctor since November. In spite of this he continued to prescribe Percocet and 90 pills of 10 mg Percocet is awaiting the patient at the pharmacy. When past the patient told me that he is awaiting psychiatric evaluation for as special medical device that can be used for chronic pain. I do not believe that this is a pump. He will likely have to discontinue pain medication. We started Suboxone taper.   Principal Problem: Major depressive disorder, recurrent severe without psychotic features (HCC) Diagnosis:   Patient Active Problem List   Diagnosis Date Noted  . Noncompliance with treatment plan [Z91.11] 03/11/2015  . Major depressive disorder, recurrent severe without psychotic features (HCC) [F33.2] 03/11/2015  . Suicidal ideation [R45.851] 02/24/2015  . Chronic pain [G89.29] 02/24/2015  . Cough [R05] 02/24/2015  . Opioid use disorder, severe, dependence (HCC) [F11.20] 02/24/2015  . Hypertension [I10] 08/02/2014  . Gastric reflux [K21.9] 08/02/2014   Total Time spent with patient: 20 minutes  Past Psychiatric History: Depression, chronic pain, mood instability.  Past Medical History:  Past Medical History  Diagnosis Date  . Hypertension   . Bipolar 1 disorder (HCC)   . Chronic pain   . Anxiety     Past Surgical History  Procedure Laterality Date  . Knee surgery Left    Family History: History reviewed. No pertinent family  history. Family Psychiatric  History: None reported. Social History:  History  Alcohol Use No     History  Drug Use No    Social History   Social History  . Marital Status: Divorced    Spouse Name: N/A  . Number of Children: N/A  . Years of Education: N/A   Social History Main Topics  . Smoking status: Former Games developer  . Smokeless tobacco: Never Used  . Alcohol Use: No  . Drug Use: No  . Sexual Activity: Not Asked   Other Topics Concern  . None   Social History Narrative   Additional Social History:                         Sleep: Fair  Appetite:  Fair  Current Medications: Current Facility-Administered Medications  Medication Dose Route Frequency Provider Last Rate Last Dose  . acetaminophen (TYLENOL) tablet 650 mg  650 mg Oral Q6H PRN Mylo Driskill B Elnathan Fulford, MD      . alum & mag hydroxide-simeth (MAALOX/MYLANTA) 200-200-20 MG/5ML suspension 30 mL  30 mL Oral Q4H PRN Nelson Noone B Lagena Strand, MD      . amitriptyline (ELAVIL) tablet 150 mg  150 mg Oral QHS Herold Salguero B Lien Lyman, MD   150 mg at 03/17/15 2137  . buprenorphine (SUBUTEX) SL tablet 4 mg  4 mg Sublingual Daily Shari Prows, MD   4 mg at 03/18/15 0833  . cyclobenzaprine (FLEXERIL) tablet 10 mg  10 mg Oral BID PRN Jimmy Footman, MD  10 mg at 03/18/15 0616  . escitalopram (LEXAPRO) tablet 20 mg  20 mg Oral Daily Shari Prows, MD   20 mg at 03/18/15 0832  . ibuprofen (ADVIL,MOTRIN) tablet 800 mg  800 mg Oral Q6H PRN Shari Prows, MD   800 mg at 03/18/15 0616  . lurasidone (LATUDA) tablet 80 mg  80 mg Oral Q supper Shari Prows, MD   80 mg at 03/17/15 1604  . magnesium hydroxide (MILK OF MAGNESIA) suspension 30 mL  30 mL Oral Daily PRN Yaneliz Radebaugh B Cash Duce, MD      . nicotine (NICODERM CQ - dosed in mg/24 hours) patch 21 mg  21 mg Transdermal Q0600 Shari Prows, MD   21 mg at 03/18/15 0616  . pregabalin (LYRICA) capsule 100 mg  100 mg Oral BID Jimmy Footman, MD   100 mg at 03/18/15 1610  . traZODone (DESYREL) tablet 200 mg  200 mg Oral QHS Shari Prows, MD   200 mg at 03/17/15 2136    Lab Results: No results found for this or any previous visit (from the past 48 hour(s)).  Physical Findings: AIMS: Facial and Oral Movements Muscles of Facial Expression: None, normal Lips and Perioral Area: None, normal Jaw: None, normal Tongue: None, normal,Extremity Movements Upper (arms, wrists, hands, fingers): None, normal Lower (legs, knees, ankles, toes): None, normal, Trunk Movements Neck, shoulders, hips: None, normal, Overall Severity Severity of abnormal movements (highest score from questions above): None, normal Incapacitation due to abnormal movements: None, normal Patient's awareness of abnormal movements (rate only patient's report): No Awareness, Dental Status Current problems with teeth and/or dentures?: No Does patient usually wear dentures?: No  CIWA:    COWS:     Musculoskeletal: Strength & Muscle Tone: within normal limits Gait & Station: normal Patient leans: N/A  Psychiatric Specialty Exam: Review of Systems  Musculoskeletal: Positive for back pain.  All other systems reviewed and are negative.   Blood pressure 130/75, pulse 66, temperature 98.9 F (37.2 C), temperature source Oral, resp. rate 20, height 6' (1.829 m), weight 125 kg (275 lb 9.2 oz), SpO2 96 %.Body mass index is 37.37 kg/(m^2).  General Appearance: Casual  Eye Contact::  Good  Speech:  Clear and Coherent  Volume:  Normal  Mood:  Depressed  Affect:  Appropriate  Thought Process:  Goal Directed  Orientation:  Full (Time, Place, and Person)  Thought Content:  WDL  Suicidal Thoughts:  No  Homicidal Thoughts:  Yes.  with intent/plan  Memory:  Immediate;   Fair Recent;   Fair Remote;   Fair  Judgement:  Poor  Insight:  Shallow  Psychomotor Activity:  Normal  Concentration:  Fair  Recall:  [employer] of Knowledge:Fair   Language: Fair  Akathisia:  No  Handed:  Right  AIMS (if indicated):     Assets:  Communication Skills Desire for Improvement Financial Resources/Insurance Housing Resilience Social Support  ADL's:  Intact  Cognition: WNL  Sleep:  Number of Hours: 5.25   Treatment Plan Summary: Daily contact with patient to assess and evaluate symptoms and progress in treatment and Medication management   Mr. Mayall is a 50 year old male with history of depression, anxiety, mood instability, chronic pain and suicidal ideation admitted for worsening of depression and suicidal and homicidal thinking.  1. Suicidal ideation. This has resolved but the patient suddenly developed violent thoughts and wishes to kill his wife. He is not able to contract for safety in the community. We  have to postpone his discharge.   2. Mood. We continued Lexapro and Elavil for depression and started Latuda for mood stabilization.   3. Chronic pain. He is in the care of pain clinic. We continued Percocet, added Flexeril and substituted Neurontin with Lyrica at patient's request. As the patient prepares to switch from narcotic painkillers to wear medical advice restarted Suboxone taper.  4. Insomnia. We continued trazodone.   5. Smoking. Nicotine patch was available.  6. Disposition. He will be discharged back to his boarding house. He will follow up with his regular psychiatrist at [facility]. He qualifies for community support team at this point already. We attempted to get ACT team for him with no success.   Physical Findings:  Anderson Middlebrooks 03/18/2015, 3:37 PM

## 2015-03-18 NOTE — [facility] Group Notes (Signed)
[facility] Group Notes:  (Nursing/MHT/Case Management/Adjunct)  Date:  03/18/2015  Time:  10:10 PM  Type of Therapy:  Group Therapy  Participation Level:  Active  Participation Quality:  Appropriate  Affect:  Appropriate  Cognitive:  Appropriate  Insight:  Appropriate  Engagement in Group:  Engaged  Modes of Intervention:  Education  Summary of Progress/Problems:  Burt Ek 03/18/2015, 10:10 PM

## 2015-03-18 NOTE — Progress Notes (Signed)
D: Daniel [name] has been cooperative and pleasant with this Clinical research associate, but clearly irritated that his pain medication has been decreased. He reports poor sleep, good appetite, low energy, and good concentration. He rated his depression 8, feelings of hopelessness 8, and anxiety 10. He reports thoughts of SI/HI in his dreams but contracts for safety on the unit. He rated his pain an 8.  A: Meds given as ordered. Q15 safety checks maintained. Support/encouragement offered. R: Pt remains free from harm and continues with treatment. Will continue to monitor for needs/safety.

## 2015-03-18 NOTE — Progress Notes (Signed)
Patient denies SI/HI/AVH. Patient compliant and pleasant this shift. Patient states he is ready for d/c tomorrow.

## 2015-03-18 NOTE — Progress Notes (Signed)
[facility] MD Progress Note  03/18/2015 3:47 PM DUJUAN STANKOWSKI  MRN:  [id number]  Subjective:  Mr. day is still feels homicidal towards his wife. He is on Suboxone taper. He complains of back pain. His situation with pain is not at all clear as he has not seen a doctor since November. He accepts medications and tolerates them well. He participates in programming.  Principal Problem: Major depressive disorder, recurrent severe without psychotic features (HCC) Diagnosis:   Patient Active Problem List   Diagnosis Date Noted  . Noncompliance with treatment plan [Z91.11] 03/11/2015  . Major depressive disorder, recurrent severe without psychotic features (HCC) [F33.2] 03/11/2015  . Suicidal ideation [R45.851] 02/24/2015  . Chronic pain [G89.29] 02/24/2015  . Cough [R05] 02/24/2015  . Opioid use disorder, severe, dependence (HCC) [F11.20] 02/24/2015  . Hypertension [I10] 08/02/2014  . Gastric reflux [K21.9] 08/02/2014   Total Time spent with patient: 20 minutes  Past Psychiatric History: Depression, anxiety, mood instability, substance use.  Past Medical History:  Past Medical History  Diagnosis Date  . Hypertension   . Bipolar 1 disorder (HCC)   . Chronic pain   . Anxiety     Past Surgical History  Procedure Laterality Date  . Knee surgery Left    Family History: History reviewed. No pertinent family history. Family Psychiatric  History: None reported. Social History:  History  Alcohol Use No     History  Drug Use No    Social History   Social History  . Marital Status: Divorced    Spouse Name: N/A  . Number of Children: N/A  . Years of Education: N/A   Social History Main Topics  . Smoking status: Former Games developer  . Smokeless tobacco: Never Used  . Alcohol Use: No  . Drug Use: No  . Sexual Activity: Not Asked   Other Topics Concern  . None   Social History Narrative   Additional Social History:                         Sleep: Fair  Appetite:   Fair  Current Medications: Current Facility-Administered Medications  Medication Dose Route Frequency Provider Last Rate Last Dose  . acetaminophen (TYLENOL) tablet 650 mg  650 mg Oral Q6H PRN Neftali Thurow B Thera Basden, MD      . alum & mag hydroxide-simeth (MAALOX/MYLANTA) 200-200-20 MG/5ML suspension 30 mL  30 mL Oral Q4H PRN Edouard Gikas B Milisa Kimbell, MD      . amitriptyline (ELAVIL) tablet 150 mg  150 mg Oral QHS Trinette Vera B Marlei Glomski, MD   150 mg at 03/17/15 2137  . buprenorphine (SUBUTEX) SL tablet 4 mg  4 mg Sublingual Daily Shari Prows, MD   4 mg at 03/18/15 0833  . cyclobenzaprine (FLEXERIL) tablet 10 mg  10 mg Oral BID PRN Jimmy Footman, MD   10 mg at 03/18/15 0616  . escitalopram (LEXAPRO) tablet 20 mg  20 mg Oral Daily Shari Prows, MD   20 mg at 03/18/15 0832  . ibuprofen (ADVIL,MOTRIN) tablet 800 mg  800 mg Oral Q6H PRN Shari Prows, MD   800 mg at 03/18/15 0616  . lurasidone (LATUDA) tablet 80 mg  80 mg Oral Q supper Shari Prows, MD   80 mg at 03/17/15 1604  . magnesium hydroxide (MILK OF MAGNESIA) suspension 30 mL  30 mL Oral Daily PRN Weslie Rasmus B Linc Renne, MD      . nicotine (NICODERM CQ - dosed in mg/24  hours) patch 21 mg  21 mg Transdermal Q0600 Shari Prows, MD   21 mg at 03/18/15 0616  . pregabalin (LYRICA) capsule 100 mg  100 mg Oral BID Jimmy Footman, MD   100 mg at 03/18/15 1610  . traZODone (DESYREL) tablet 200 mg  200 mg Oral QHS Shari Prows, MD   200 mg at 03/17/15 2136    Lab Results: No results found for this or any previous visit (from the past 48 hour(s)).  Physical Findings: AIMS: Facial and Oral Movements Muscles of Facial Expression: None, normal Lips and Perioral Area: None, normal Jaw: None, normal Tongue: None, normal,Extremity Movements Upper (arms, wrists, hands, fingers): None, normal Lower (legs, knees, ankles, toes): None, normal, Trunk Movements Neck, shoulders, hips: None, normal,  Overall Severity Severity of abnormal movements (highest score from questions above): None, normal Incapacitation due to abnormal movements: None, normal Patient's awareness of abnormal movements (rate only patient's report): No Awareness, Dental Status Current problems with teeth and/or dentures?: No Does patient usually wear dentures?: No  CIWA:    COWS:     Musculoskeletal: Strength & Muscle Tone: within normal limits Gait & Station: normal Patient leans: N/A  Psychiatric Specialty Exam: Review of Systems  Musculoskeletal: Positive for back pain.  All other systems reviewed and are negative.   Blood pressure 130/75, pulse 66, temperature 98.9 F (37.2 C), temperature source Oral, resp. rate 20, height 6' (1.829 m), weight 125 kg (275 lb 9.2 oz), SpO2 96 %.Body mass index is 37.37 kg/(m^2).  General Appearance: Casual  Eye Contact::  Good  Speech:  Clear and Coherent  Volume:  Normal  Mood:  Anxious  Affect:  Appropriate  Thought Process:  Goal Directed  Orientation:  Full (Time, Place, and Person)  Thought Content:  WDL  Suicidal Thoughts:  No  Homicidal Thoughts:  Yes.  with intent/plan  Memory:  Immediate;   Fair Recent;   Fair Remote;   Fair  Judgement:  Fair  Insight:  Shallow  Psychomotor Activity:  Normal  Concentration:  Fair  Recall:  [employer] of Knowledge:Fair  Language: Fair  Akathisia:  No  Handed:  Right  AIMS (if indicated):     Assets:  Communication Skills Desire for Improvement Financial Resources/Insurance Housing Resilience Social Support  ADL's:  Intact  Cognition: WNL  Sleep:  Number of Hours: 5.25   Treatment Plan Summary: Daily contact with patient to assess and evaluate symptoms and progress in treatment and Medication management   Mr. Drees is a 50 year old male with history of depression, anxiety, mood instability, chronic pain and suicidal ideation admitted for worsening of depression and suicidal and homicidal thinking.  1.  Suicidal ideation. This has resolved but the patient suddenly developed violent thoughts and wishes to kill his wife. He is not able to contract for safety in the community. We have to postpone his discharge.   2. Mood. We continued Lexapro and Elavil for depression and started Latuda for mood stabilization.   3. Chronic pain. He is in the care of pain clinic. We continued Percocet, added Flexeril and substituted Neurontin with Lyrica at patient's request. As the patient prepares to switch from narcotic painkillers to a medical advice we started Suboxone taper for 3 days.  4. Insomnia. We continued trazodone.   5. Smoking. Nicotine patch was available.  6. Disposition. He will be discharged back to his boarding house. He will follow up with his regular psychiatrist at [facility]. He qualifies for community support  team at this point already. We attempted to get ACT team for him with no success  Alphons Burgert 03/18/2015, 3:47 PM

## 2015-03-18 NOTE — Progress Notes (Signed)
Recreation Therapy Notes  Date: 01.17.17 Time: 3:00 pm Location: Craft Room  Group Topic: Self-expression  Goal Area(s) Addresses:  Patient will be able to identify a color that represents each emotion. Patient will verbalize benefit of using art as a means of self-expression. Patient will verbalize one positive emotion experienced while participating in the activity.  Behavioral Response: Attentive, Interactive   Intervention: The Colors Within Me  Activity: Patients were given a blank face worksheet and instructed to analyze the emotions they were experiencing, pick a color for each emotion, and show on the face how much of that emotion they were experiencing.  Education: LRT educated patients on different forms of self-expression.  Education Outcome: Acknowledges education/In group clarification offered   Clinical Observations/Feedback: Patient completed activity by picking a color for each emotion and showing how much of that emotion he was experiencing on the worksheet. Patient contributed to group discussion by stating what steps he is taking to change his emotions, and what emotions he experienced during group.  Jacquelynn Cree, LRT/CTRS 03/18/2015 4:35 PM

## 2015-03-18 NOTE — Tx Team (Signed)
Interdisciplinary Treatment Plan Update (Adult)  Date:  03/18/2015 Time Reviewed:  4:02 PM  Progress in Treatment: Attending groups: Yes. Participating in groups:  Yes. Taking medication as prescribed:  Yes. Tolerating medication:  Yes. Family/Significant othe contact made:  No, will contact:  Pt refused Patient understands diagnosis:  Yes. Discussing patient identified problems/goals with staff:  Yes. Medical problems stabilized or resolved:  Yes. Denies suicidal/homicidal ideation: Yes. Issues/concerns per patient self-inventory:  No. Other:  New problem(s) identified: No, Describe:  NA  Discharge Plan or Barriers: Pt plans to return home and follow up with outpatient.    Reason for Continuation of Hospitalization: Depression Medication stabilization Suicidal ideation  Comments: Estimated length of stay: up to 1 day expected to discharge by Wednesday 03/19/15  New goal(s): NA  Review of initial/current patient goals per problem list:   1.  Goal(s): Patient will participate in aftercare plan * Met: No * Target date: at discharge * As evidenced by: Patient will participate within aftercare plan AEB aftercare provider and housing plan at discharge being identified.   2.  Goal (s): Patient will exhibit decreased depressive symptoms and suicidal ideations. * Met: No *  Target date: at discharge * As evidenced by: Patient will utilize self rating of depression at 3 or below and demonstrate decreased signs of depression or be deemed stable for discharge by MD.   3.  Goal(s): Patient will demonstrate decreased signs and symptoms of anxiety. * Met: No * Target date: at discharge * As evidenced by: Patient will utilize self rating of anxiety at 3 or below and demonstrated decreased signs of anxiety, or be deemed stable for discharge by MD    Attendees: Physician:   Orson Slick, MD  1/17/20174:02 PM  Nursing:   Kerby Nora, RN  1/17/20174:02 PM  Other:  Fransisca Connors 1/17/20174:02 PM  Other:   1/17/20174:02 PM  Other:   1/17/20174:02 PM  Other:  1/17/20174:02 PM  Other:  1/17/20174:02 PM  Other:  1/17/20174:02 PM  Other:  1/17/20174:02 PM  Other:  1/17/20174:02 PM  Other:  1/17/20174:02 PM  Other:   1/17/20174:02 PM   Scribe for Treatment Team:  Carmell Austria T MSW, LCSWA [phone] 03/18/2015, 4:02 PM

## 2015-03-18 NOTE — [facility] Group Notes (Signed)
[facility] Group Notes:  (Nursing/MHT/Case Management/Adjunct)  Date:  03/18/2015  Time:  1:09 AM  Type of Therapy:  Group Therapy  Participation Level:  Active  Participation Quality:  Appropriate and Supportive  Affect:  Appropriate  Cognitive:  Appropriate  Insight:  Good  Engagement in Group:  Engaged and Supportive  Modes of Intervention:  n/a  Summary of Progress/Problems:  Daniel [name] 03/18/2015, 1:09 AM

## 2015-03-19 MED ORDER — LURASIDONE HCL 80 MG PO TABS: 80.0000 mg | ORAL_TABLET | Freq: Every day | ORAL | Status: DC

## 2015-03-19 MED ORDER — ESCITALOPRAM OXALATE 20 MG PO TABS: 20.0000 mg | ORAL_TABLET | Freq: Every day | ORAL | Status: DC

## 2015-03-19 MED ORDER — IBUPROFEN 800 MG PO TABS: 800.0000 mg | ORAL_TABLET | Freq: Three times a day (TID) | ORAL | Status: DC | PRN

## 2015-03-19 MED ORDER — PREGABALIN 100 MG PO CAPS: 100.0000 mg | ORAL_CAPSULE | Freq: Two times a day (BID) | ORAL | Status: DC

## 2015-03-19 MED ORDER — CYCLOBENZAPRINE HCL 10 MG PO TABS: 10.0000 mg | ORAL_TABLET | Freq: Two times a day (BID) | ORAL | Status: DC | PRN

## 2015-03-19 MED ORDER — TRAZODONE HCL 100 MG PO TABS: 200.0000 mg | ORAL_TABLET | Freq: Every day | ORAL | Status: DC

## 2015-03-19 MED ORDER — AMITRIPTYLINE HCL 150 MG PO TABS: 150.0000 mg | ORAL_TABLET | Freq: Every day | ORAL | Status: DC

## 2015-03-19 NOTE — Progress Notes (Signed)
D: Observed pt interacting in dayroom. Patient alert and oriented x4. Patient denies SI/HI/AVH. Pt affect is pleasant. Pt rates depression 7/10 and anxiety 8/10, and stated "it's been getting back." Pt discussed wanting to leave boarding home and the difficulties of being so close with the mother of his ex-wife. Pt indicated he was looking forward to [facility] per support post discharge. Pt denies thoughts of harming the ex-wife stating "the medication has been working." Pt c/o of chronic back pain. Pt irritated that percocet was discontinued, but did not direct irritation at Clinical research associate. A: Offered active listening and support. Provided therapeutic communication. Administered scheduled medications. Encouraged pt to explore other support options. Provided flexeril and ibuprofen PRN for pain. R: Pt pleasant and cooperative. Pt med compliant. Pt expressed agreement that current situation with mother-in-law might not be best. Will continue Q15 min. checks. Safety maintained.

## 2015-03-19 NOTE — [facility] Group Notes (Signed)
[facility] Group Notes:  (Nursing/MHT/Case Management/Adjunct)  Date:  03/19/2015  Time:  12:34 PM  Type of Therapy:  Psychoeducational Skills  Participation Level:  Minimal  Participation Quality:  Intrusive  Affect:  Appropriate  Cognitive:  Appropriate  Insight:  Limited  Engagement in Group:  Monopolizing and Poor  Modes of Intervention:  Discussion, Education and Support  Summary of Progress/Problems:  Lynelle Smoke Veeda Virgo 03/19/2015, 12:34 PM

## 2015-03-19 NOTE — Progress Notes (Signed)
Recreation Therapy Notes  INPATIENT RECREATION TR PLAN  Patient Details Name: Daniel [name] MRN: [id number] DOB: 22-Jul-1965 Today's Date: 03/19/2015  Rec Therapy Plan Treatment times per week: At least once a week TR Treatment/Interventions: 1:1 session, Group participation (Comment) (Appropriate participation in daily recreation therapy tx)  Discharge Criteria Pt will be discharged from therapy if:: Treatment goals are met, Discharged Treatment plan/goals/alternatives discussed and agreed upon by:: Patient/family  Discharge Summary Short term goals set: See Care Plan Short term goals met: Complete Progress toward goals comments: One-to-one attended Which groups?: Wellness, Other (Comment) (Self-expression) One-to-one attended: Self-esteem, stress management Reason goals not met: N/A Therapeutic equipment acquired: None Reason patient discharged from therapy: Discharge from hospital Pt/family agrees with progress & goals achieved: Yes Date patient discharged from therapy: 03/19/15   Leonette Monarch, LRT/CTRS 03/19/2015, 1:49 PM

## 2015-03-19 NOTE — Plan of Care (Signed)
Problem: Alteration in mood Goal: LTG-Patient reports reduction in suicidal thoughts (Patient reports reduction in suicidal thoughts and is able to verbalize a safety plan for whenever patient is feeling suicidal)  Outcome: Progressing Patient denies SI/HI.      

## 2015-03-19 NOTE — Progress Notes (Signed)
Patient discharged home. DC instructions provided and explained. Medications reviewed. Rx given. All questions answered. Pt stable at discharge. Denies SI, HI, AVH. Belongings returned. 

## 2015-03-19 NOTE — Progress Notes (Signed)
  [facility] Adult Case Management Discharge Plan :  Will you be returning to the same living situation after discharge:  Yes,  back to boarding house At discharge, do you have transportation home?: No. Patient provided with [employer] bus pass home Do you have the ability to pay for your medications: Yes,  patient has insurance  Release of information consent forms completed and in the chart;  Patient's signature needed at discharge.  Patient to Follow up at: Follow-up Information    Follow up with [facility].   Why:  Patient was referred to [facility] for ACT services but will follow up at [facility] for CST assessment until ACT services are in place   Contact information:   [address] [place], [place] Ph [phone] Fax [phone]       Follow up with [facility]. Go on 03/21/2015.   Why:  For follow-up care appt Friday 03/21/15 at 7:00am   Contact information:   [address] [place], [place] Ph [phone] Fax [phone] Lorella Nimrod [phone]       Next level of care provider has access to [facility] Link:no  Safety Planning and Suicide Prevention discussed: Yes,  SPE discussed with patient but patient refused family contact   Have you used any form of tobacco in the last 30 days? (Cigarettes, Smokeless Tobacco, Cigars, and/or Pipes): Yes  Has patient been referred to the Quitline?: Patient refused referral  Patient has been referred for addiction treatment: N/A, patient denies substance use and tox screen shows patient positive only for prescribed medications  Lulu Riding, MSW, LCSWA [phone] 03/19/2015, 2:58 PM

## 2015-03-19 NOTE — Tx Team (Signed)
Interdisciplinary Treatment Plan Update (Adult)  Date:  03/19/2015 Time Reviewed:  2:56 PM  Progress in Treatment: Attending groups: Yes. Participating in groups:  Yes. Taking medication as prescribed:  Yes. Tolerating medication:  Yes. Family/Significant othe contact made:  No, will contact:  Pt refused Patient understands diagnosis:  Yes. Discussing patient identified problems/goals with staff:  Yes. Medical problems stabilized or resolved:  Yes. Denies suicidal/homicidal ideation: Yes. Issues/concerns per patient self-inventory:  No. Other:  New problem(s) identified: No, Describe:  NA  Discharge Plan or Barriers: Pt plans to return home and follow up with outpatient.    Reason for Continuation of Hospitalization: Depression Medication stabilization Suicidal ideation  Comments: Estimated length of stay: will discharge today Wednesday 03/19/15  New goal(s): NA  Review of initial/current patient goals per problem list:   1.  Goal(s): Patient will participate in aftercare plan * Met: Yes * Target date: at discharge * As evidenced by: Patient will participate within aftercare plan AEB aftercare provider and housing plan at discharge being identified.   2.  Goal (s): Patient will exhibit decreased depressive symptoms and suicidal ideations. * Met: Patient deemed stable at discharge per MD *  Target date: at discharge * As evidenced by: Patient will utilize self rating of depression at 3 or below and demonstrate decreased signs of depression or be deemed stable for discharge by MD.   3.  Goal(s): Patient will demonstrate decreased signs and symptoms of anxiety. * Met: Yes * Target date: at discharge * As evidenced by: Patient will utilize self rating of anxiety at 3 or below and demonstrated decreased signs of anxiety, or be deemed stable for discharge by MD    Attendees: Physician:   Orson Slick, MD  1/18/20172:56 PM  Nursing:   Floyde Parkins, RN  1/18/20172:56 PM   Other:  Keene Breath, Latanya Presser 1/18/20172:56 PM  Other:   1/18/20172:56 PM  Other:   1/18/20172:56 PM  Other:  1/18/20172:56 PM  Other:  1/18/20172:56 PM  Other:  1/18/20172:56 PM  Other:  1/18/20172:56 PM  Other:  1/18/20172:56 PM  Other:  1/18/20172:56 PM  Other:   1/18/20172:56 PM   Scribe for Treatment Team:  Keene Breath MSW, LCSWA [phone] 03/19/2015, 2:56 PM

## 2015-03-27 ENCOUNTER — Emergency Department
Admission: EM | Admit: 2015-03-27 | Discharge: 2015-03-28 | Disposition: A | Discharge status: Home / Self Care | Payer: Medicaid Other | Source: Home / Self Care | Attending: Emergency Medicine | Admitting: Emergency Medicine

## 2015-03-27 DIAGNOSIS — F17200 Nicotine dependence, unspecified, uncomplicated: Secondary | ICD-10-CM | POA: Diagnosis not present

## 2015-03-27 DIAGNOSIS — I10 Essential (primary) hypertension: Secondary | ICD-10-CM | POA: Insufficient documentation

## 2015-03-27 DIAGNOSIS — Z79899 Other long term (current) drug therapy: Secondary | ICD-10-CM | POA: Diagnosis not present

## 2015-03-27 DIAGNOSIS — F1310 Sedative, hypnotic or anxiolytic abuse, uncomplicated: Secondary | ICD-10-CM | POA: Diagnosis not present

## 2015-03-27 DIAGNOSIS — Z87891 Personal history of nicotine dependence: Secondary | ICD-10-CM

## 2015-03-27 DIAGNOSIS — G8929 Other chronic pain: Secondary | ICD-10-CM | POA: Insufficient documentation

## 2015-03-27 DIAGNOSIS — R45851 Suicidal ideations: Secondary | ICD-10-CM | POA: Insufficient documentation

## 2015-03-27 DIAGNOSIS — F332 Major depressive disorder, recurrent severe without psychotic features: Secondary | ICD-10-CM | POA: Insufficient documentation

## 2015-03-27 DIAGNOSIS — Z9119 Patient's noncompliance with other medical treatment and regimen: Secondary | ICD-10-CM | POA: Insufficient documentation

## 2015-03-27 DIAGNOSIS — R0602 Shortness of breath: Secondary | ICD-10-CM | POA: Insufficient documentation

## 2015-03-27 DIAGNOSIS — Z760 Encounter for issue of repeat prescription: Secondary | ICD-10-CM | POA: Insufficient documentation

## 2015-03-27 DIAGNOSIS — Z046 Encounter for general psychiatric examination, requested by authority: Secondary | ICD-10-CM | POA: Diagnosis present

## 2015-03-27 LAB — CBC WITH DIFFERENTIAL/PLATELET
Basophils Absolute: 0.1 10*3/uL (ref 0–0.1)
Basophils Relative: 1 %
Eosinophils Absolute: 0.1 10*3/uL (ref 0–0.7)
Eosinophils Relative: 1 %
HEMATOCRIT: 44.1 % (ref 40.0–52.0)
HEMOGLOBIN: 14.9 g/dL (ref 13.0–18.0)
LYMPHS ABS: 2.3 10*3/uL (ref 1.0–3.6)
LYMPHS PCT: 22 %
MCH: 29.9 pg (ref 26.0–34.0)
MCHC: 33.7 g/dL (ref 32.0–36.0)
MCV: 88.8 fL (ref 80.0–100.0)
Monocytes Absolute: 0.6 10*3/uL (ref 0.2–1.0)
Monocytes Relative: 6 %
NEUTROS ABS: 7.5 10*3/uL — AB (ref 1.4–6.5)
NEUTROS PCT: 70 %
Platelets: 302 10*3/uL (ref 150–440)
RBC: 4.96 MIL/uL (ref 4.40–5.90)
RDW: 13.4 % (ref 11.5–14.5)
WBC: 10.6 10*3/uL (ref 3.8–10.6)

## 2015-03-27 LAB — COMPREHENSIVE METABOLIC PANEL
ALT: 29 U/L (ref 17–63)
AST: 21 U/L (ref 15–41)
Albumin: 4.3 g/dL (ref 3.5–5.0)
Alkaline Phosphatase: 80 U/L (ref 38–126)
Anion gap: 11 (ref 5–15)
BUN: 11 mg/dL (ref 6–20)
CHLORIDE: 104 mmol/L (ref 101–111)
CO2: 21 mmol/L — ABNORMAL LOW (ref 22–32)
Calcium: 9.4 mg/dL (ref 8.9–10.3)
Creatinine, Ser: 0.82 mg/dL (ref 0.61–1.24)
GFR calc Af Amer: >60 mL/min (ref >60)
Glucose, Bld: 130 mg/dL — ABNORMAL HIGH (ref 65–99)
POTASSIUM: 3.4 mmol/L — AB (ref 3.5–5.1)
Sodium: 136 mmol/L (ref 135–145)
Total Bilirubin: 0.5 mg/dL (ref 0.3–1.2)
Total Protein: 8 g/dL (ref 6.5–8.1)

## 2015-03-27 MED ORDER — AMITRIPTYLINE HCL 100 MG PO TABS
100.0000 mg | ORAL_TABLET | Freq: Every day | ORAL | Status: DC
  Administered 2015-03-28: 100 mg via ORAL
  Filled 2015-03-27 (×2): qty 1

## 2015-03-27 MED ORDER — TRAZODONE HCL 50 MG PO TABS
150.0000 mg | ORAL_TABLET | Freq: Every day | ORAL | Status: DC
  Administered 2015-03-27: 150 mg via ORAL
  Filled 2015-03-27: qty 1

## 2015-03-27 MED ORDER — LURASIDONE HCL 80 MG PO TABS: 80.0000 mg | ORAL_TABLET | Freq: Every day | ORAL | Status: DC

## 2015-03-27 MED ORDER — ESCITALOPRAM OXALATE 10 MG PO TABS
20.0000 mg | ORAL_TABLET | Freq: Once | ORAL | Status: AC
  Administered 2015-03-27: 20 mg via ORAL
  Filled 2015-03-27: qty 2

## 2015-03-27 NOTE — ED Notes (Signed)
Name and DOB confirmed with patient and match name braclet. Patient brought to ED from boarding house by [employer] and states he doesn't get his check until the end of the month so cannot get his medicines, Lexapro, Flexeril, Lyrica, Latuda, Trazodone, Elavil, Percocet, Ibuprofen.

## 2015-03-28 ENCOUNTER — Emergency Department
Admission: EM | Admit: 2015-03-28 | Discharge: 2015-03-30 | Disposition: A | Discharge status: Other Acute Inpatient Hospital | Payer: Medicaid Other | Attending: Emergency Medicine | Admitting: Emergency Medicine

## 2015-03-28 ENCOUNTER — Encounter: Payer: Self-pay | Admitting: *Deleted

## 2015-03-28 DIAGNOSIS — Z9111 Patient's noncompliance with dietary regimen: Secondary | ICD-10-CM

## 2015-03-28 DIAGNOSIS — F332 Major depressive disorder, recurrent severe without psychotic features: Secondary | ICD-10-CM

## 2015-03-28 DIAGNOSIS — R45851 Suicidal ideations: Secondary | ICD-10-CM

## 2015-03-28 DIAGNOSIS — Z91199 Patient's noncompliance with other medical treatment and regimen due to unspecified reason: Secondary | ICD-10-CM

## 2015-03-28 LAB — COMPREHENSIVE METABOLIC PANEL
ALT: 28 U/L (ref 17–63)
AST: 19 U/L (ref 15–41)
Albumin: 4.5 g/dL (ref 3.5–5.0)
Alkaline Phosphatase: 80 U/L (ref 38–126)
Anion gap: 11 (ref 5–15)
BUN: 11 mg/dL (ref 6–20)
CO2: 24 mmol/L (ref 22–32)
Calcium: 9.3 mg/dL (ref 8.9–10.3)
Chloride: 106 mmol/L (ref 101–111)
Creatinine, Ser: 0.98 mg/dL (ref 0.61–1.24)
GFR calc Af Amer: >60 mL/min (ref >60)
GLUCOSE: 126 mg/dL — AB (ref 65–99)
POTASSIUM: 4.3 mmol/L (ref 3.5–5.1)
SODIUM: 141 mmol/L (ref 135–145)
Total Bilirubin: 0.6 mg/dL (ref 0.3–1.2)
Total Protein: 8.1 g/dL (ref 6.5–8.1)

## 2015-03-28 LAB — URINE DRUG SCREEN, QUALITATIVE (ARMC ONLY)
AMPHETAMINES, UR SCREEN: NOT DETECTED
BENZODIAZEPINE, UR SCRN: NOT DETECTED
Barbiturates, Ur Screen: NOT DETECTED
COCAINE METABOLITE, UR ~~LOC~~: NOT DETECTED
Cannabinoid 50 Ng, Ur ~~LOC~~: NOT DETECTED
MDMA (ECSTASY) UR SCREEN: NOT DETECTED
Methadone Scn, Ur: NOT DETECTED
OPIATE, UR SCREEN: NOT DETECTED
PHENCYCLIDINE (PCP) UR S: NOT DETECTED
Tricyclic, Ur Screen: POSITIVE — AB

## 2015-03-28 LAB — CBC
HEMATOCRIT: 45.4 % (ref 40.0–52.0)
HEMOGLOBIN: 15 g/dL (ref 13.0–18.0)
MCH: 30.2 pg (ref 26.0–34.0)
MCHC: 33.1 g/dL (ref 32.0–36.0)
MCV: 91.2 fL (ref 80.0–100.0)
Platelets: 280 10*3/uL (ref 150–440)
RBC: 4.98 MIL/uL (ref 4.40–5.90)
RDW: 13.9 % (ref 11.5–14.5)
WBC: 7.7 10*3/uL (ref 3.8–10.6)

## 2015-03-28 LAB — ACETAMINOPHEN LEVEL
Acetaminophen (Tylenol), Serum: <10 ug/mL — ABNORMAL LOW (ref 10–30)
Acetaminophen (Tylenol), Serum: <10 ug/mL — ABNORMAL LOW (ref 10–30)

## 2015-03-28 LAB — ETHANOL
Alcohol, Ethyl (B): <5 mg/dL (ref <5)
Alcohol, Ethyl (B): <5 mg/dL (ref <5)

## 2015-03-28 LAB — SALICYLATE LEVEL
Salicylate Lvl: <4 mg/dL (ref 2.8–30.0)
Salicylate Lvl: <4 mg/dL (ref 2.8–30.0)

## 2015-03-28 MED ORDER — OXYCODONE-ACETAMINOPHEN 5-325 MG PO TABS
1.0000 | ORAL_TABLET | Freq: Once | ORAL | Status: AC
  Administered 2015-03-28: 1 via ORAL

## 2015-03-28 MED ORDER — OXYCODONE-ACETAMINOPHEN 5-325 MG PO TABS
ORAL_TABLET | ORAL | Status: AC
  Administered 2015-03-28: 1 via ORAL
  Filled 2015-03-28: qty 1

## 2015-03-28 NOTE — ED Provider Notes (Signed)
[facility] Emergency Department Provider Note  ____________________________________________  Time seen: 11:25 PM  I have reviewed the triage vital signs and the nursing notes.   HISTORY  Chief Complaint Hallucinations      HPI Daniel [name] is a 50 y.o. male presents with inability to fill his prescriptions namely Lexapro Flexeril, Lyrica, Latuda trazodone Elavil Percocet and ibuprofen. Patient was recently admitted to [facility] and discharged home with the stated prescriptions. Patient states that he has Medicaid however each prescriptions requires a 3.co-pay which she does not have.    Past Medical History  Diagnosis Date  . Hypertension   . Bipolar 1 disorder (HCC)   . Chronic pain   . Anxiety     Patient Active Problem List   Diagnosis Date Noted  . Noncompliance with treatment plan 03/11/2015  . Major depressive disorder, recurrent severe without psychotic features (HCC) 03/11/2015  . Suicidal ideation 02/24/2015  . Chronic pain 02/24/2015  . Cough 02/24/2015  . Opioid use disorder, severe, dependence (HCC) 02/24/2015  . Hypertension 08/02/2014  . Gastric reflux 08/02/2014    Past Surgical History  Procedure Laterality Date  . Knee surgery Left     Current Outpatient Rx  Name  Route  Sig  Dispense  Refill  . amitriptyline (ELAVIL) 150 MG tablet   Oral   Take 1 tablet (150 mg total) by mouth at bedtime.   30 tablet   0   . cyclobenzaprine (FLEXERIL) 10 MG tablet   Oral   Take 1 tablet (10 mg total) by mouth 2 (two) times daily as needed for muscle spasms.   60 tablet   0   . escitalopram (LEXAPRO) 20 MG tablet   Oral   Take 1 tablet (20 mg total) by mouth daily.   30 tablet   0   . ibuprofen (ADVIL,MOTRIN) 800 MG tablet   Oral   Take 1 tablet (800 mg total) by mouth every 8 (eight) hours as needed for moderate pain.   90 tablet   0   . lurasidone (LATUDA) 80 MG TABS tablet   Oral   Take 1 tablet (80 mg  total) by mouth daily with supper.   30 tablet   0   . oxyCODONE-acetaminophen (PERCOCET) 10-325 MG tablet   Oral   Take 1 tablet by mouth every 8 (eight) hours as needed for pain.   36 tablet   0   . pregabalin (LYRICA) 100 MG capsule   Oral   Take 1 capsule (100 mg total) by mouth 2 (two) times daily.   60 capsule   0   . traZODone (DESYREL) 100 MG tablet   Oral   Take 2 tablets (200 mg total) by mouth at bedtime.   30 tablet   0   . traZODone (DESYREL) 150 MG tablet   Oral   Take 1 tablet (150 mg total) by mouth at bedtime. Patient not taking: Reported on 03/14/2015   30 tablet   0     Allergies Hydrocodone-acetaminophen  No family history on file.  Social History Social History  Substance Use Topics  . Smoking status: Former Games developer  . Smokeless tobacco: Never Used  . Alcohol Use: No    Review of Systems  Constitutional: Negative for fever. Eyes: Negative for visual changes. ENT: Negative for sore throat. Cardiovascular: Negative for chest pain. Respiratory: Positive for shortness of breath. Gastrointestinal: Negative for abdominal pain, vomiting and diarrhea. Genitourinary: Negative for dysuria. Musculoskeletal: Negative  for back pain. Skin: Negative for rash. Neurological: Negative for headaches, focal weakness or numbness.   10-point ROS otherwise negative.  ____________________________________________   PHYSICAL EXAM:  VITAL SIGNS: ED Triage Vitals  Enc Vitals Group     BP 03/27/15 2226 155/92 mmHg     Pulse Rate 03/27/15 2226 110     Resp 03/27/15 2226 20     Temp 03/27/15 2226 97.6 F (36.4 C)     Temp Source 03/27/15 2226 Oral     SpO2 03/27/15 2226 97 %     Weight 03/27/15 2226 275 lb (124.739 kg)     Height 03/27/15 2226 6' (1.829 m)     Head Cir --      Peak Flow --      Pain Score 03/27/15 2227 9     Pain Loc --      Pain Edu? --      Excl. in GC? --      Constitutional: Alert and oriented. Well appearing and in no  distress. Eyes: Conjunctivae are normal. PERRL. Normal extraocular movements. ENT   Head: Normocephalic and atraumatic.   Nose: No congestion/rhinnorhea.   Mouth/Throat: Mucous membranes are moist.   Neck: No stridor. Hematological/Lymphatic/Immunilogical: No cervical lymphadenopathy. Cardiovascular: Normal rate, regular rhythm. Normal and symmetric distal pulses are present in all extremities. No murmurs, rubs, or gallops. Respiratory: Normal respiratory effort without tachypnea nor retractions. Breath sounds are clear and equal bilaterally. No wheezes/rales/rhonchi. Gastrointestinal: Soft and nontender. No distention. There is no CVA tenderness. Genitourinary: deferred Musculoskeletal: Nontender with normal range of motion in all extremities. No joint effusions.  No lower extremity tenderness nor edema. Neurologic:  Normal speech and language. No gross focal neurologic deficits are appreciated. Speech is normal.  Skin:  Skin is warm, dry and intact. No rash noted. Psychiatric: Mood and affect are normal. Speech and behavior are normal. Patient exhibits appropriate insight and judgment.  ____________________________________________    LABS (pertinent positives/negatives)  Labs Reviewed  COMPREHENSIVE METABOLIC PANEL - Abnormal; Notable for the following:    Potassium 3.4 (*)    CO2 21 (*)    Glucose, Bld 130 (*)    All other components within normal limits  CBC WITH DIFFERENTIAL/PLATELET - Abnormal; Notable for the following:    Neutro Abs 7.5 (*)    All other components within normal limits  ETHANOL  SALICYLATE LEVEL  ACETAMINOPHEN LEVEL  URINE DRUG SCREEN, QUALITATIVE ([facility] ONLY)      INITIAL IMPRESSION / ASSESSMENT AND PLAN / ED COURSE  Pertinent labs & imaging results that were available during my care of the patient were reviewed by me and considered in my medical decision making (see chart for  details).   ____________________________________________   FINAL CLINICAL IMPRESSION(S) / ED DIAGNOSES  Final diagnoses:  Medication management      Darci Current, MD 03/28/15 [phone]

## 2015-03-28 NOTE — ED Notes (Signed)
Pt brought in by [employer].  Pt states SI.  Pt denies drugs or etoh.  Pt calm and cooperative.

## 2015-03-29 MED ORDER — PREGABALIN 75 MG PO CAPS
150.0000 mg | ORAL_CAPSULE | Freq: Three times a day (TID) | ORAL | Status: DC
  Administered 2015-03-29 – 2015-03-30 (×6): 150 mg via ORAL
  Filled 2015-03-29 (×4): qty 2

## 2015-03-29 MED ORDER — OXYCODONE-ACETAMINOPHEN 10-325 MG PO TABS: 1.0000 | ORAL_TABLET | Freq: Three times a day (TID) | ORAL | Status: DC | PRN

## 2015-03-29 MED ORDER — OXYCODONE-ACETAMINOPHEN 5-325 MG PO TABS
ORAL_TABLET | ORAL | Status: AC
  Filled 2015-03-29: qty 1

## 2015-03-29 MED ORDER — ESCITALOPRAM OXALATE 10 MG PO TABS
20.0000 mg | ORAL_TABLET | Freq: Every day | ORAL | Status: DC
  Administered 2015-03-29 – 2015-03-30 (×2): 20 mg via ORAL
  Filled 2015-03-29 (×2): qty 2

## 2015-03-29 MED ORDER — PREGABALIN 50 MG PO CAPS
ORAL_CAPSULE | ORAL | Status: AC
  Filled 2015-03-29: qty 3

## 2015-03-29 MED ORDER — PREGABALIN 100 MG PO CAPS
100.0000 mg | ORAL_CAPSULE | Freq: Two times a day (BID) | ORAL | Status: DC
  Administered 2015-03-29: 100 mg via ORAL
  Filled 2015-03-29 (×2): qty 2

## 2015-03-29 MED ORDER — TRAZODONE HCL 100 MG PO TABS
200.0000 mg | ORAL_TABLET | Freq: Every day | ORAL | Status: DC
  Administered 2015-03-29 – 2015-03-30 (×3): 200 mg via ORAL
  Filled 2015-03-29 (×3): qty 2

## 2015-03-29 MED ORDER — AMITRIPTYLINE HCL 50 MG PO TABS
150.0000 mg | ORAL_TABLET | Freq: Every day | ORAL | Status: DC
  Administered 2015-03-29 – 2015-03-30 (×3): 150 mg via ORAL
  Filled 2015-03-29 (×3): qty 3

## 2015-03-29 MED ORDER — IBUPROFEN 800 MG PO TABS
800.0000 mg | ORAL_TABLET | Freq: Three times a day (TID) | ORAL | Status: DC | PRN
  Administered 2015-03-29 – 2015-03-30 (×3): 800 mg via ORAL
  Filled 2015-03-29 (×3): qty 1

## 2015-03-29 MED ORDER — OXYCODONE HCL 5 MG PO TABS
5.0000 mg | ORAL_TABLET | Freq: Four times a day (QID) | ORAL | Status: DC | PRN
  Administered 2015-03-29 (×4): 5 mg via ORAL
  Filled 2015-03-29 (×4): qty 1

## 2015-03-29 MED ORDER — OXYCODONE-ACETAMINOPHEN 5-325 MG PO TABS
1.0000 | ORAL_TABLET | Freq: Three times a day (TID) | ORAL | Status: DC | PRN
  Administered 2015-03-29 – 2015-03-30 (×6): 1 via ORAL
  Filled 2015-03-29 (×6): qty 1

## 2015-03-29 MED ORDER — LURASIDONE HCL 80 MG PO TABS
80.0000 mg | ORAL_TABLET | Freq: Every day | ORAL | Status: DC
  Administered 2015-03-29 – 2015-03-30 (×2): 80 mg via ORAL
  Filled 2015-03-29 (×2): qty 1

## 2015-03-29 MED ORDER — NICOTINE POLACRILEX 2 MG MT GUM
2.0000 mg | CHEWING_GUM | OROMUCOSAL | Status: DC | PRN
  Filled 2015-03-29: qty 1

## 2015-03-29 MED ORDER — CYCLOBENZAPRINE HCL 10 MG PO TABS
10.0000 mg | ORAL_TABLET | Freq: Two times a day (BID) | ORAL | Status: DC | PRN
  Administered 2015-03-29 – 2015-03-30 (×4): 10 mg via ORAL
  Filled 2015-03-29 (×4): qty 1

## 2015-03-29 MED ORDER — PREGABALIN 50 MG PO CAPS
ORAL_CAPSULE | ORAL | Status: AC
  Administered 2015-03-29: 150 mg via ORAL
  Filled 2015-03-29: qty 3

## 2015-03-29 NOTE — ED Notes (Signed)
Report from jane, rn.  

## 2015-03-29 NOTE — ED Notes (Signed)
Pt requests water, nicotine and more back pain - pt states "this bed ain't right"

## 2015-03-29 NOTE — ED Notes (Signed)
TTS at bedside. 

## 2015-03-29 NOTE — ED Notes (Signed)
BEHAVIORAL HEALTH ROUNDING Patient sleeping: Yes.   Patient alert and oriented: yes Behavior appropriate: Yes.  ; If no, describe:  Nutrition and fluids offered: Yes  Toileting and hygiene offered: Yes  Sitter present: yes Law enforcement present: Yes  

## 2015-03-29 NOTE — ED Notes (Signed)
Report to Jane, RN  

## 2015-03-29 NOTE — ED Notes (Signed)
BEHAVIORAL HEALTH ROUNDING Patient sleeping: No. Patient alert and oriented: yes Behavior appropriate: Yes.  ; If no, describe:  Nutrition and fluids offered: Yes  Toileting and hygiene offered: Yes  Sitter present: yes Law enforcement present: Yes  

## 2015-03-29 NOTE — ED Notes (Signed)
Breakfast tray placed at pt bedside, pt sleeping.

## 2015-03-29 NOTE — ED Notes (Signed)
Pt moved into hospital bed. Pt hx of chronic back pain, pt very uncomfortable in stretcher. Charge nurse aware. Pt cooperative, pleasant, grateful. Pt alert and oriented X4, active, cooperative, pt in NAD. RR even and unlabored, color WNL.

## 2015-03-29 NOTE — ED Notes (Signed)
BEHAVIORAL HEALTH ROUNDING  Patient sleeping: No.  Patient alert and oriented: yes  Behavior appropriate: Yes. ; If no, describe:  Nutrition and fluids offered: Yes  Toileting and hygiene offered: Yes  Sitter present: not applicable, Q 15 min safety rounds and observation via security camera. Law enforcement present: Yes ODS  

## 2015-03-29 NOTE — ED Notes (Signed)
Pt eating lunch tray  

## 2015-03-29 NOTE — ED Notes (Addendum)
Pt awoken from sleep to test ambulatory status. Pt ambulatory without difficulty.

## 2015-03-29 NOTE — ED Notes (Signed)
Pt updated on move to BHU. 

## 2015-03-29 NOTE — ED Notes (Signed)
Report from Noel, RN 

## 2015-03-29 NOTE — ED Notes (Signed)
Pt brought into ED BHU via sally port and wand with metal detector for safety by ODS officer. Patient oriented to unit/care area: Pt informed of unit policies and procedures.  Informed that, for their safety, care areas are designed for safety and monitored by security cameras at all times; and visiting hours explained to patient. Patient verbalizes understanding, and verbal contract for safety obtained.Pt shown to their room.  

## 2015-03-29 NOTE — ED Notes (Signed)
Pharmacy notified to send percocet and Elavil.

## 2015-03-29 NOTE — BH Assessment (Signed)
Writer seen patient to complete re-assessment for an updated mental and emotional status.  Patient states he couldn't get his medications due to having no money. He had a recent overdraft with his bank account. He will get his monthly check this upcoming Wednesday.  He's been without his medications for approximately a month. His current symptoms are; seeing shadows.   Medications are Lexapro, Trazadone, Elavil, Lyrica, Latuda, Percocet, Ibuprofen, flexural.  On last night he was able to sleep. "That's the first night in quite a while."  He sates he is having SI with the thoughts of walking in front of ongoing traffic. However, due to his religious beliefs, he hasn't done it. "I'm a Christian and I know if I do it (suicide), I won't go to heaven."   History of cutting. Last time he cut was approximately 15 years ago.  Currently with [facility] an in the process of starting Peer Support services.

## 2015-03-29 NOTE — BH Assessment (Signed)
Assessment Note  Daniel [name] is an 50 y.o. male presenting to the ED via BPD for suicidal ideations with no intent.  Pt reports increasing anxiety and worsening depression.  He states that he is a "God-fearing person" and wants to eventually go to heaven.  He reports no specific suicide plan and states he is "tired and wants to end it all".    He was previousle seen in the ED this week because he is out of his meds.  He is stating the same tonight.  Diagnosis: Depression/Suicidal  Past Medical History:  Past Medical History  Diagnosis Date  . Hypertension   . Bipolar 1 disorder (HCC)   . Chronic pain   . Anxiety     Past Surgical History  Procedure Laterality Date  . Knee surgery Left     Family History: No family history on file.  Social History:  reports that he has been smoking.  He has never used smokeless tobacco. He reports that he does not drink alcohol or use illicit drugs.  Additional Social History:  Alcohol / Drug Use History of alcohol / drug use?: No history of alcohol / drug abuse  CIWA: CIWA-Ar BP: (!) 150/91 mmHg Pulse Rate: (!) 123 COWS:    Allergies:  Allergies  Allergen Reactions  . Hydrocodone-Acetaminophen Hives    Vicodin/Norco    Home Medications:  (Not in a hospital admission)  OB/GYN Status:  No LMP for male patient.  General Assessment Data Location of Assessment: [facility] ED TTS Assessment: In system Is this a Tele or Face-to-Face Assessment?: Face-to-Face Is this an Initial Assessment or a Re-assessment for this encounter?: Initial Assessment Marital status: Divorced [place] name: N/A Is patient pregnant?: No Pregnancy Status: No Living Arrangements: Non-relatives/Friends Can pt return to current living arrangement?: Yes Admission Status: Voluntary Is patient capable of signing voluntary admission?: Yes Referral Source: Self/Family/Friend Insurance type: Medicaid     Crisis Care Plan Living Arrangements:  Non-relatives/Friends Legal Guardian: Other: (self) Name of Psychiatrist: N/A Name of Therapist: N/A  Education Status Is patient currently in school?: No Current Grade: N/A Highest grade of school patient has completed: N/A Name of school: N/A Contact person: N/A  Risk to self with the past 6 months Suicidal Ideation: Yes-Currently Present Has patient been a risk to self within the past 6 months prior to admission? : No Suicidal Intent: No Has patient had any suicidal intent within the past 6 months prior to admission? : No Is patient at risk for suicide?: No Suicidal Plan?: No Has patient had any suicidal plan within the past 6 months prior to admission? : Yes Specify Current Suicidal Plan: Pt has had past plan of purchasign gun to shoot himself  Access to Means: No What has been your use of drugs/alcohol within the last 12 months?: None reported Previous Attempts/Gestures: Yes How many times?: 1 Other Self Harm Risks: None reported Triggers for Past Attempts: Spouse contact Intentional Self Injurious Behavior: None Family Suicide History: No Recent stressful life event(s): Divorce, Other (Comment) (neighbor was murdered) Persecutory voices/beliefs?: No Depression: Yes Depression Symptoms: Feeling angry/irritable Substance abuse history and/or treatment for substance abuse?: No Suicide prevention information given to non-admitted patients: Not applicable  Risk to Others within the past 6 months Homicidal Ideation: No Does patient have any lifetime risk of violence toward others beyond the six months prior to admission? : No Thoughts of Harm to Others: No Comment - Thoughts of Harm to Others: N/A Current Homicidal Intent: No Current  Homicidal Plan: No Describe Current Homicidal Plan:  (N/A) Access to Homicidal Means: No Describe Access to Homicidal Means: N/A Identified Victim: N/A History of harm to others?: No Assessment of Violence: None Noted Violent Behavior  Description: N/A Does patient have access to weapons?: No Criminal Charges Pending?: No Does patient have a court date: No Is patient on probation?: No  Psychosis Hallucinations: None noted Delusions: None noted  Mental Status Report Appearance/Hygiene: Unremarkable Eye Contact: Fair Motor Activity: Freedom of movement Speech: Logical/coherent Level of Consciousness: Alert Mood: Apprehensive, Fearful Affect: Appropriate to circumstance Anxiety Level: Minimal Judgement: Partial Orientation: Person, Place, Time, Situation Obsessive Compulsive Thoughts/Behaviors: None  Cognitive Functioning Concentration: Normal Memory: Recent Intact, Remote Intact IQ: Average Insight: Fair Impulse Control: Fair Appetite: Good Weight Loss: 0 Weight Gain: 0 Sleep: No Change Total Hours of Sleep: 6 Vegetative Symptoms: None  ADLScreening [facility] Assessment Services) Patient's cognitive ability adequate to safely complete daily activities?: Yes Patient able to express need for assistance with ADLs?: Yes Independently performs ADLs?: Yes (appropriate for developmental age)  Prior Inpatient Therapy Prior Inpatient Therapy: Yes Prior Therapy Dates: 07/2014 Prior Therapy Facilty/Provider(s): [facility] Reason for Treatment: Suicidal  Prior Outpatient Therapy Prior Outpatient Therapy: No Prior Therapy Dates: N/A Prior Therapy Facilty/Provider(s): N/A Reason for Treatment: N/A Does patient have an ACCT team?: No Does patient have Intensive In-House Services?  : No Does patient have [facility] services? : No Does patient have P4CC services?: No  ADL Screening (condition at time of admission) Patient's cognitive ability adequate to safely complete daily activities?: Yes Patient able to express need for assistance with ADLs?: Yes Independently performs ADLs?: Yes (appropriate for developmental age)       Abuse/Neglect Assessment (Assessment to be complete while patient is alone) Physical Abuse:  Denies Verbal Abuse: Denies Sexual Abuse: Denies Exploitation of patient/patient's resources: Denies Self-Neglect: Denies Values / Beliefs Cultural Requests During Hospitalization: None Spiritual Requests During Hospitalization: None Consults Spiritual Care Consult Needed: No Social Work Consult Needed: No Merchant navy officer (For Healthcare) Does patient have an advance directive?: No    Additional Information 1:1 In Past 12 Months?: No CIRT Risk: No Elopement Risk: No Does patient have medical clearance?: Yes     Disposition:  Disposition Initial Assessment Completed for this Encounter: Yes Disposition of Patient: Other dispositions Other disposition(s): Other (Comment) (Pending)  On Site Evaluation by:   Reviewed with Physician:    [place] 03/29/2015 1:38 AM

## 2015-03-29 NOTE — ED Notes (Signed)

## 2015-03-29 NOTE — ED Notes (Signed)
Report to ann, rn.  
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
## 2015-03-29 NOTE — BH Assessment (Signed)
Assessment Completed.  Consulted with , Nurse Practitioner (Laura Davis) who recommends the patient be observed overnight and be reassessed tomorrow (03/30/2015).  ER MD (Dr. McShane) informed of this decision. 

## 2015-03-30 NOTE — ED Notes (Signed)
BEHAVIORAL HEALTH ROUNDING  Patient sleeping: No.  Patient alert and oriented: yes  Behavior appropriate: Yes. ; If no, describe:  Nutrition and fluids offered: Yes  Toileting and hygiene offered: Yes  Sitter present: not applicable, Q 15 min safety rounds and observation via security camera. Law enforcement present: Yes ODS  

## 2015-03-30 NOTE — ED Notes (Signed)
Called [facility] for consult spoke to Daniel [name] [phone]  Notified Daniel [name]

## 2015-03-30 NOTE — ED Notes (Signed)
Patient resting quietly in room. No noted distress or abnormal behaviors noted. Will continue 15 minute checks and observation by security camera for safety. 

## 2015-03-30 NOTE — ED Notes (Signed)
Patient complaining of severe chronic back pain. Upset that he is not getting his pain medications as he has been taking at home although patient states he has not been able to pay for any medications. Mood is irritable. Patient was given ordered PRN medications to assist with pain control. Will continue to monitor.

## 2015-03-30 NOTE — ED Notes (Signed)
Patient currently interviewing with telepsych clinician. Patient still complaining of chronic back pain. Given PRN medication per MD order.Maintained on 15 minute checks and observation by security camera for safety.

## 2015-03-30 NOTE — ED Notes (Signed)
Patient more irritable, frequent requests to check the time of next doses for pain medications. Patient is in dayroom, able to walk steadily, social and animated with peers.  Maintained on all safety precautions.

## 2015-03-30 NOTE — ED Notes (Signed)
Patient continues to focus on his pain medications. He says if he can't get his medications he might as well go home. He was reminded that he came to the hospital because he felt depressed and suicidal.  He is to be reevaluated by clinician via tele psych.

## 2015-03-30 NOTE — ED Notes (Signed)
ENVIRONMENTAL ASSESSMENT  Potentially harmful objects out of patient reach: Yes.  Personal belongings secured: Yes.  Patient dressed in hospital provided attire only: Yes.  Plastic bags out of patient reach: Yes.  Patient care equipment (cords, cables, call bells, lines, and drains) shortened, removed, or accounted for: Yes.  Equipment and supplies removed from bottom of stretcher: Yes.  Potentially toxic materials out of patient reach: Yes.  Sharps container removed or out of patient reach: Yes.   BEHAVIORAL HEALTH ROUNDING  Patient sleeping: No.  Patient alert and oriented: yes  Behavior appropriate: Yes. ; If no, describe:  Nutrition and fluids offered: Yes  Toileting and hygiene offered: Yes  Sitter present: not applicable, Q 15 min safety rounds and observation via security camera. Law enforcement present: Yes ODS   ED BHU PLACEMENT JUSTIFICATION  Is the patient under IVC or is there intent for IVC: No awaiting discharge.  Is the patient medically cleared: Yes.  Is there vacancy in the ED BHU: Yes.  Is the population mix appropriate for patient: Yes.  Is the patient awaiting placement in inpatient or outpatient setting: Yes.  Has the patient had a psychiatric consult: Yes.  Survey of unit performed for contraband, proper placement and condition of furniture, tampering with fixtures in bathroom, shower, and each patient room: Yes. ; Findings: All clear  APPEARANCE/BEHAVIOR  calm, cooperative and adequate rapport can be established  NEURO ASSESSMENT  Orientation: time, place and person  Hallucinations: No.None noted (Hallucinations)  Speech: Normal  Gait: normal  RESPIRATORY ASSESSMENT  WNL  CARDIOVASCULAR ASSESSMENT  WNL  GASTROINTESTINAL ASSESSMENT  WNL  EXTREMITIES  WNL  PLAN OF CARE  Provide calm/safe environment. Vital signs assessed twice daily. ED BHU Assessment once each 12-hour shift. Collaborate with intake RN daily or as condition indicates. Assure the ED  provider has rounded once each shift. Provide and encourage hygiene. Provide redirection as needed. Assess for escalating behavior; address immediately and inform ED provider.  Assess family dynamic and appropriateness for visitation as needed: Yes. ; If necessary, describe findings:  Educate the patient/family about BHU procedures/visitation: Yes. ; If necessary, describe findings: Pt is calm and cooperative at this time. Pt understanding and accepting of unit procedures/rules. Will continue to monitor with Q 15 min safety rounds and observation via security camera.

## 2015-03-30 NOTE — Discharge Instructions (Signed)
You have been seen in the Emergency Department (ED) today for a psychiatric complaint.  You have been evaluated by psychiatry and we believe you are safe to be discharged from the hospital.    Please return to the ED immediately if you have ANY thoughts of hurting yourself or anyone else, so that we may help you.  Please avoid alcohol and drug use.  Follow up with your doctor and/or therapist as soon as possible regarding today's ED visit.   Please follow up any other recommendations and clinic appointments provided by the psychiatry team that saw you in the Emergency Department.   Major Depressive Disorder Major depressive disorder is a mental illness. It also may be called clinical depression or unipolar depression. Major depressive disorder usually causes feelings of sadness, hopelessness, or helplessness. Some people with this disorder do not feel particularly sad but lose interest in doing things they used to enjoy (anhedonia). Major depressive disorder also can cause physical symptoms. It can interfere with work, school, relationships, and other normal everyday activities. The disorder varies in severity but is longer lasting and more serious than the sadness we all feel from time to time in our lives. Major depressive disorder often is triggered by stressful life events or major life changes. Examples of these triggers include divorce, loss of your job or home, a move, and the death of a family member or close friend. Sometimes this disorder occurs for no obvious reason at all. People who have family members with major depressive disorder or bipolar disorder are at higher risk for developing this disorder, with or without life stressors. Major depressive disorder can occur at any age. It may occur just once in your life (single episode major depressive disorder). It may occur multiple times (recurrent major depressive disorder). SYMPTOMS People with major depressive disorder have either anhedonia  or depressed mood on nearly a daily basis for at least 2 weeks or longer. Symptoms of depressed mood include:  Feelings of sadness (blue or down in the dumps) or emptiness.  Feelings of hopelessness or helplessness.  Tearfulness or episodes of crying (may be observed by others).  Irritability (children and adolescents). In addition to depressed mood or anhedonia or both, people with this disorder have at least four of the following symptoms:  Difficulty sleeping or sleeping too much.   Significant change (increase or decrease) in appetite or weight.   Lack of energy or motivation.  Feelings of guilt and worthlessness.   Difficulty concentrating, remembering, or making decisions.  Unusually slow movement (psychomotor retardation) or restlessness (as observed by others).   Recurrent wishes for death, recurrent thoughts of self-harm (suicide), or a suicide attempt. People with major depressive disorder commonly have persistent negative thoughts about themselves, other people, and the world. People with severe major depressive disorder may experiencedistorted beliefs or perceptions about the world (psychotic delusions). They also may see or hear things that are not real (psychotic hallucinations). DIAGNOSIS Major depressive disorder is diagnosed through an assessment by your health care provider. Your health care provider will ask aboutaspects of your daily life, such as mood,sleep, and appetite, to see if you have the diagnostic symptoms of major depressive disorder. Your health care provider may ask about your medical history and use of alcohol or drugs, including prescription medicines. Your health care provider also may do a physical exam and blood work. This is because certain medical conditions and the use of certain substances can cause major depressive disorder-like symptoms (secondary depression). Your health  care provider also may refer you to a mental health specialist for  further evaluation and treatment. °TREATMENT °It is important to recognize the symptoms of major depressive disorder and seek treatment. The following treatments can be prescribed for this disorder:   °· Medicine. Antidepressant medicines usually are prescribed. Antidepressant medicines are thought to correct chemical imbalances in the brain that are commonly associated with major depressive disorder. Other types of medicine may be added if the symptoms do not respond to antidepressant medicines alone or if psychotic delusions or hallucinations occur. °· Talk therapy. Talk therapy can be helpful in treating major depressive disorder by providing support, education, and guidance. Certain types of talk therapy also can help with negative thinking (cognitive behavioral therapy) and with relationship issues that trigger this disorder (interpersonal therapy). °A mental health specialist can help determine which treatment is best for you. Most people with major depressive disorder do well with a combination of medicine and talk therapy. Treatments involving electrical stimulation of the brain can be used in situations with extremely severe symptoms or when medicine and talk therapy do not work over time. These treatments include electroconvulsive therapy, transcranial magnetic stimulation, and vagal nerve stimulation. °  °This information is not intended to replace advice given to you by your health care provider. Make sure you discuss any questions you have with your health care provider. °  °Document Released: 06/12/2012 Document Revised: 03/08/2014 Document Reviewed: 06/12/2012 °Elsevier Interactive Patient Education ©2016 [employer] ° °

## 2015-03-30 NOTE — ED Notes (Signed)
ENVIRONMENTAL ASSESSMENT Potentially harmful objects out of patient reach: Yes Personal belongings secured: Yes Patient dressed in hospital provided attire only: Yes Plastic bags out of patient reach: Yes Patient care equipment (cords, cables, call bells, lines, and drains) shortened, removed, or accounted for: Yes Equipment and supplies removed from bottom of stretcher: Yes Potentially toxic materials out of patient reach: Yes Sharps container removed or out of patient reach: Yes  Maintained on 15 minute checks and observation by security camera for safety.  

## 2015-03-30 NOTE — ED Provider Notes (Signed)
Patient cleared for discharge by psychiatrist.  Patient stable. No longer voicing any suicidal thoughts or ideation. We'll discharge with outpatient follow-up. Not meeting inpatient criteria for psychiatric hospitalization.  Sharyn Creamer, MD 03/30/15 [id number]
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
## 2015-03-30 NOTE — ED Notes (Addendum)
Patient completed telepsych interview without incident.Maintained on 15 minute checks and observation by security camera for safety.

## 2015-03-30 NOTE — ED Notes (Signed)
Patient in dayroom. In no apparent distress. Maintained on 15 minute checks and observation by security camera for safety. 

## 2015-03-30 NOTE — ED Notes (Signed)
Patient continues to complain of chronic back pain. Wants MD to call pain clinic to verify medications. Also states he needs to have a hospital bed. Patient says he is angry that emergency physicians are not "giving me what I need."  Patient says he is still having suicidal thoughts although does not have a plan. He wants to be on the inpatient unit so he can "have people to talk with." Maintained on 15 minute checks and observation by security camera for safety.

## 2015-03-30 NOTE — ED Provider Notes (Signed)
-----------------------------------------   6:40 AM on 03/30/2015 -----------------------------------------   Blood pressure 118/92, pulse 89, temperature 98.6 F (37 C), temperature source Oral, resp. rate 20, height 6' (1.829 m), weight 275 lb (124.739 kg), SpO2 98 %.  The patient had no acute events since last update.  Calm and cooperative at this time.  Disposition is pending per Psychiatry/Behavioral Medicine team recommendations.     Irean Hong, MD 03/30/15 [phone]

## 2015-03-30 NOTE — ED Notes (Signed)
Patient took a shower. Patient reports minimal relief of back pain. He is able to walk and move around in recliner chair. Maintained on 15 minute checks and observation by security camera for safety.

## 2015-04-21 DIAGNOSIS — Z79899 Other long term (current) drug therapy: Secondary | ICD-10-CM | POA: Insufficient documentation

## 2015-04-21 DIAGNOSIS — F3130 Bipolar disorder, current episode depressed, mild or moderate severity, unspecified: Secondary | ICD-10-CM | POA: Diagnosis not present

## 2015-04-21 DIAGNOSIS — R45851 Suicidal ideations: Secondary | ICD-10-CM | POA: Diagnosis present

## 2015-04-21 DIAGNOSIS — F332 Major depressive disorder, recurrent severe without psychotic features: Secondary | ICD-10-CM | POA: Diagnosis not present

## 2015-04-21 DIAGNOSIS — F1110 Opioid abuse, uncomplicated: Secondary | ICD-10-CM | POA: Diagnosis not present

## 2015-04-21 DIAGNOSIS — F17200 Nicotine dependence, unspecified, uncomplicated: Secondary | ICD-10-CM | POA: Insufficient documentation

## 2015-04-21 DIAGNOSIS — I10 Essential (primary) hypertension: Secondary | ICD-10-CM | POA: Diagnosis not present

## 2015-04-22 ENCOUNTER — Emergency Department
Admission: EM | Admit: 2015-04-22 | Discharge: 2015-04-22 | Disposition: A | Discharge status: Other Acute Inpatient Hospital | Payer: Medicaid Other | Attending: Emergency Medicine | Admitting: Emergency Medicine

## 2015-04-22 DIAGNOSIS — F332 Major depressive disorder, recurrent severe without psychotic features: Secondary | ICD-10-CM | POA: Diagnosis not present

## 2015-04-22 DIAGNOSIS — I10 Essential (primary) hypertension: Secondary | ICD-10-CM | POA: Diagnosis present

## 2015-04-22 DIAGNOSIS — G8929 Other chronic pain: Secondary | ICD-10-CM | POA: Diagnosis present

## 2015-04-22 DIAGNOSIS — R45851 Suicidal ideations: Secondary | ICD-10-CM

## 2015-04-22 DIAGNOSIS — F1120 Opioid dependence, uncomplicated: Secondary | ICD-10-CM | POA: Diagnosis present

## 2015-04-22 DIAGNOSIS — F329 Major depressive disorder, single episode, unspecified: Secondary | ICD-10-CM

## 2015-04-22 DIAGNOSIS — F32A Depression, unspecified: Secondary | ICD-10-CM

## 2015-04-22 LAB — URINALYSIS COMPLETE WITH MICROSCOPIC (ARMC ONLY)
BILIRUBIN URINE: NEGATIVE
GLUCOSE, UA: NEGATIVE mg/dL
Ketones, ur: NEGATIVE mg/dL
LEUKOCYTES UA: NEGATIVE
Nitrite: NEGATIVE
Protein, ur: NEGATIVE mg/dL
Specific Gravity, Urine: 1.023 (ref 1.005–1.030)
pH: 5 (ref 5.0–8.0)

## 2015-04-22 LAB — BASIC METABOLIC PANEL WITH GFR
Anion gap: 5 (ref 5–15)
BUN: 13 mg/dL (ref 6–20)
CO2: 27 mmol/L (ref 22–32)
Calcium: 8.7 mg/dL — ABNORMAL LOW (ref 8.9–10.3)
Chloride: 108 mmol/L (ref 101–111)
Creatinine, Ser: 1.05 mg/dL (ref 0.61–1.24)
GFR calc Af Amer: >60 mL/min
GFR calc non Af Amer: >60 mL/min
Glucose, Bld: 98 mg/dL (ref 65–99)
Potassium: 4 mmol/L (ref 3.5–5.1)
Sodium: 140 mmol/L (ref 135–145)

## 2015-04-22 LAB — CBC
HEMATOCRIT: 40 % (ref 40.0–52.0)
HEMOGLOBIN: 13.4 g/dL (ref 13.0–18.0)
MCH: 29.9 pg (ref 26.0–34.0)
MCHC: 33.4 g/dL (ref 32.0–36.0)
MCV: 89.6 fL (ref 80.0–100.0)
PLATELETS: 277 10*3/uL (ref 150–440)
RBC: 4.47 MIL/uL (ref 4.40–5.90)
RDW: 14.1 % (ref 11.5–14.5)
WBC: 9.4 10*3/uL (ref 3.8–10.6)

## 2015-04-22 LAB — URINE DRUG SCREEN, QUALITATIVE (ARMC ONLY)
Amphetamines, Ur Screen: NOT DETECTED
Barbiturates, Ur Screen: NOT DETECTED
Benzodiazepine, Ur Scrn: NOT DETECTED
Cannabinoid 50 Ng, Ur ~~LOC~~: NOT DETECTED
Cocaine Metabolite,Ur ~~LOC~~: NOT DETECTED
MDMA (Ecstasy)Ur Screen: NOT DETECTED
Methadone Scn, Ur: NOT DETECTED
Opiate, Ur Screen: POSITIVE — AB
Phencyclidine (PCP) Ur S: NOT DETECTED
Tricyclic, Ur Screen: NOT DETECTED

## 2015-04-22 LAB — ETHANOL: Alcohol, Ethyl (B): <5 mg/dL

## 2015-04-22 MED ORDER — NICOTINE 21 MG/24HR TD PT24
21.0000 mg | MEDICATED_PATCH | Freq: Once | TRANSDERMAL | Status: DC
  Administered 2015-04-22: 21 mg via TRANSDERMAL

## 2015-04-22 MED ORDER — PREGABALIN 75 MG PO CAPS
150.0000 mg | ORAL_CAPSULE | Freq: Three times a day (TID) | ORAL | Status: DC
  Administered 2015-04-22 (×2): 150 mg via ORAL
  Filled 2015-04-22 (×2): qty 2

## 2015-04-22 MED ORDER — TRAZODONE HCL 100 MG PO TABS: 200.0000 mg | ORAL_TABLET | Freq: Every day | ORAL | Status: DC

## 2015-04-22 MED ORDER — AMITRIPTYLINE HCL 150 MG PO TABS: 150.0000 mg | ORAL_TABLET | Freq: Every day | ORAL | Status: DC

## 2015-04-22 MED ORDER — PREGABALIN 150 MG PO CAPS: 150.0000 mg | ORAL_CAPSULE | Freq: Three times a day (TID) | ORAL | Status: DC

## 2015-04-22 MED ORDER — CYCLOBENZAPRINE HCL 10 MG PO TABS
10.0000 mg | ORAL_TABLET | Freq: Two times a day (BID) | ORAL | Status: DC
  Administered 2015-04-22 (×2): 10 mg via ORAL
  Filled 2015-04-22 (×2): qty 1

## 2015-04-22 MED ORDER — GI COCKTAIL ~~LOC~~
30.0000 mL | Freq: Once | ORAL | Status: AC
  Administered 2015-04-22: 30 mL via ORAL
  Filled 2015-04-22: qty 30

## 2015-04-22 MED ORDER — OXYCODONE-ACETAMINOPHEN 5-325 MG PO TABS
1.0000 | ORAL_TABLET | Freq: Three times a day (TID) | ORAL | Status: DC | PRN
  Administered 2015-04-22: 1 via ORAL
  Filled 2015-04-22: qty 1

## 2015-04-22 MED ORDER — ESCITALOPRAM OXALATE 20 MG PO TABS: 20.0000 mg | ORAL_TABLET | Freq: Every day | ORAL | Status: DC

## 2015-04-22 MED ORDER — LURASIDONE HCL 80 MG PO TABS: 80.0000 mg | ORAL_TABLET | Freq: Every day | ORAL | Status: DC

## 2015-04-22 MED ORDER — LORAZEPAM 1 MG PO TABS
1.0000 mg | ORAL_TABLET | Freq: Once | ORAL | Status: AC
  Administered 2015-04-22: 1 mg via ORAL
  Filled 2015-04-22: qty 1

## 2015-04-22 MED ORDER — CYCLOBENZAPRINE HCL 10 MG PO TABS: 10.0000 mg | ORAL_TABLET | Freq: Two times a day (BID) | ORAL | Status: DC

## 2015-04-22 MED ORDER — LURASIDONE HCL 80 MG PO TABS
80.0000 mg | ORAL_TABLET | Freq: Every day | ORAL | Status: DC
Start: 2015-04-22 — End: 2015-06-20

## 2015-04-22 MED ORDER — NICOTINE 21 MG/24HR TD PT24
MEDICATED_PATCH | TRANSDERMAL | Status: AC
  Administered 2015-04-22: 21 mg via TRANSDERMAL
  Filled 2015-04-22: qty 1

## 2015-04-22 MED ORDER — AMITRIPTYLINE HCL 50 MG PO TABS
150.0000 mg | ORAL_TABLET | Freq: Every day | ORAL | Status: DC
  Filled 2015-04-22: qty 3

## 2015-04-22 MED ORDER — IBUPROFEN 800 MG PO TABS
800.0000 mg | ORAL_TABLET | Freq: Three times a day (TID) | ORAL | Status: DC | PRN
  Administered 2015-04-22: 800 mg via ORAL
  Filled 2015-04-22: qty 1

## 2015-04-22 MED ORDER — ESCITALOPRAM OXALATE 10 MG PO TABS
20.0000 mg | ORAL_TABLET | Freq: Every day | ORAL | Status: DC
  Administered 2015-04-22: 20 mg via ORAL
  Filled 2015-04-22: qty 2

## 2015-04-22 MED ORDER — PREGABALIN 75 MG PO CAPS: 150.0000 mg | ORAL_CAPSULE | Freq: Two times a day (BID) | ORAL | Status: DC
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
## 2015-04-22 NOTE — ED Notes (Signed)
Pt in with suicidal thoughts, states running out of meds.

## 2015-04-22 NOTE — Progress Notes (Signed)
LCSW called medication management and sent over signed consent and prescriptions and spoke to [facility] the pharmacist to assist patient with his medications. Patient was unable to obtain his medications from [facility] and was unable to pay the copay. Awaiting a call back from [facility] and she clears this request with her ED.  [employer] LCSW [phone]

## 2015-04-22 NOTE — ED Notes (Signed)
Patient observed lying in bed with eyes closed  Even, unlabored respirations observed   NAD pt appears to be sleeping  I will continue to monitor along with every 15 minute visual observations and ongoing security monitoring    
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
## 2015-04-22 NOTE — ED Notes (Signed)
BEHAVIORAL HEALTH ROUNDING Patient sleeping: No. Patient alert and oriented: yes Behavior appropriate: Yes.  ; If no, describe:  Nutrition and fluids offered: yes Toileting and hygiene offered: Yes  Sitter present: q15 minute observations and security  monitoring Law enforcement present: Yes  ODS  

## 2015-04-22 NOTE — ED Notes (Addendum)
BEHAVIORAL HEALTH ROUNDING Patient sleeping: yes Patient alert and oriented: sleeping Behavior appropriate: sleeping Describe behavior: No inappropriate or unacceptable behaviors noted at this time.  Nutrition and fluids offered: sleeping Toileting and hygiene offered: sleeping Sitter present: Behavioral tech rounding every 15 minutes on patient to ensure safety.  Law enforcement present: Loss adjuster, chartered agency: [employer], television/film set (ODS)

## 2015-04-22 NOTE — ED Notes (Signed)
Pt states he runs out of his medications on the 24th of each month.

## 2015-04-22 NOTE — ED Notes (Addendum)
Breakfast placed in his room  Patient observed lying in bed with eyes closed  Even, unlabored respirations observed   NAD pt appears to be sleeping  I will continue to monitor along with every 15 minute visual observations and ongoing security monitoring

## 2015-04-22 NOTE — ED Notes (Signed)
Lorella Nimrod from [facility] came in and woke him up - Lorella Nimrod needs to get report prior to walking in and disturbing pts

## 2015-04-22 NOTE — ED Notes (Signed)
TTS in room.  

## 2015-04-22 NOTE — ED Notes (Addendum)
Patient observed lying in bed with eyes closed  Even, unlabored respirations observed   NAD pt appears to be sleeping  I will continue to monitor along with every 15 minute visual observations and ongoing security monitoring    

## 2015-04-22 NOTE — ED Notes (Signed)
Lunch provided along with an extra drink  Pt observed with no unusual behavior  Appropriate to stimulation  No verbalized needs or concerns at this time  NAD assessed  Continue to monitor 

## 2015-04-22 NOTE — Progress Notes (Signed)
In discussion with medication management we are re-faxing scripts to [facility] [phone] and the cost will be picked up by the charitable foundation care of Othella Boyer [phone].  Maxwell Martorano Johnella Moloney [phone].

## 2015-04-22 NOTE — ED Notes (Addendum)
Pt states Vicodin makes him break out in hives, but Percocet is fine for him to take.

## 2015-04-22 NOTE — ED Provider Notes (Signed)
[facility] Emergency Department Provider Note  ____________________________________________  Time seen: Approximately 1:04 AM  I have reviewed the triage vital signs and the nursing notes.   HISTORY  Chief Complaint Suicidal    HPI Daniel [name] is a 50 y.o. male  who comes into the hospital today with depression and suicidal thoughts. The patient reports he called the crisis hotline and told them that he's been thinking crazy things. He also reports he had a knife. He wants to check off of the plan and does not want to live anymore. He reports that he gets anxiety when his medicine runs out at the end of the month. He is taking what to do trazodone Motrin Elavil Percocet and Flexeril. He is also had some heartburn. He reports that he's been taking medicines daily but he is nervous that there none run out. The patient reports that he was driving around with crisis and they ended up bringing him here which is not what he thought was going to happen. The patient denies drinking or doing any illegal drugs. The patient reports that last time he was here he did not like being put in to the behavioral health unit because no one spoke to him for days and let him go home with new medications.   Past Medical History  Diagnosis Date  . Hypertension   . Bipolar 1 disorder (HCC)   . Chronic pain   . Anxiety     Patient Active Problem List   Diagnosis Date Noted  . Noncompliance with treatment plan 03/11/2015  . Major depressive disorder, recurrent severe without psychotic features (HCC) 03/11/2015  . Suicidal ideation 02/24/2015  . Chronic pain 02/24/2015  . Cough 02/24/2015  . Opioid use disorder, severe, dependence (HCC) 02/24/2015  . Hypertension 08/02/2014  . Gastric reflux 08/02/2014    Past Surgical History  Procedure Laterality Date  . Knee surgery Left     Current Outpatient Rx  Name  Route  Sig  Dispense  Refill  . amitriptyline (ELAVIL) 150 MG  tablet   Oral   Take 1 tablet (150 mg total) by mouth at bedtime.   30 tablet   0   . cyclobenzaprine (FLEXERIL) 10 MG tablet   Oral   Take 1 tablet (10 mg total) by mouth 2 (two) times daily as needed for muscle spasms.   60 tablet   0   . escitalopram (LEXAPRO) 20 MG tablet   Oral   Take 1 tablet (20 mg total) by mouth daily.   30 tablet   0   . ibuprofen (ADVIL,MOTRIN) 800 MG tablet   Oral   Take 1 tablet (800 mg total) by mouth every 8 (eight) hours as needed for moderate pain.   90 tablet   0   . lurasidone (LATUDA) 80 MG TABS tablet   Oral   Take 1 tablet (80 mg total) by mouth daily with supper.   30 tablet   0   . LYRICA 150 MG capsule   Oral   Take 150 mg by mouth 3 (three) times daily.           Dispense as written.   Marland Kitchen oxyCODONE-acetaminophen (PERCOCET) 10-325 MG tablet   Oral   Take 1 tablet by mouth every 8 (eight) hours as needed for pain. Patient taking differently: Take 1 tablet by mouth every 6 (six) hours as needed for pain.    36 tablet   0   . traZODone (DESYREL)  100 MG tablet   Oral   Take 2 tablets (200 mg total) by mouth at bedtime.   30 tablet   0     Allergies Hydrocodone-acetaminophen  History reviewed. No pertinent family history.  Social History Social History  Substance Use Topics  . Smoking status: Current Every Day Smoker  . Smokeless tobacco: Never Used  . Alcohol Use: No    Review of Systems Constitutional: No fever/chills Eyes: No visual changes. ENT: No sore throat. Cardiovascular: Denies chest pain. Respiratory: Denies shortness of breath. Gastrointestinal: No abdominal pain.  No nausea, no vomiting.  No diarrhea.  No constipation. Genitourinary: Negative for dysuria. Musculoskeletal: Negative for back pain. Skin: Negative for rash. Neurological: Negative for headaches, focal weakness or numbness.  10-point ROS otherwise negative.  ____________________________________________   PHYSICAL  EXAM:  VITAL SIGNS: ED Triage Vitals  Enc Vitals Group     BP 04/22/15 0023 142/88 mmHg     Pulse Rate 04/22/15 0023 101     Resp 04/22/15 0023 18     Temp 04/22/15 0022 96.8 F (36 C)     Temp Source 04/22/15 0022 Oral     SpO2 04/22/15 0023 95 %     Weight 04/22/15 0022 275 lb (124.739 kg)     Height 04/22/15 0022 6' (1.829 m)     Head Cir --      Peak Flow --      Pain Score 04/22/15 0022 8     Pain Loc --      Pain Edu? --      Excl. in GC? --     Constitutional: Alert and oriented. Well appearing and in mild distress. Eyes: Conjunctivae are normal. PERRL. EOMI. Head: Atraumatic. Nose: No congestion/rhinnorhea. Mouth/Throat: Mucous membranes are moist.  Oropharynx non-erythematous. Cardiovascular: Normal rate, regular rhythm. Grossly normal heart sounds.  Good peripheral circulation. Respiratory: Normal respiratory effort.  No retractions. Lungs CTAB. Gastrointestinal: Soft and nontender. No distention. Positive bowel sounds Musculoskeletal: No lower extremity tenderness nor edema.   Neurologic:  Normal speech and language.  Skin:  Skin is warm, dry and intact.  Psychiatric: Mood and affect are normal.   ____________________________________________   LABS (all labs ordered are listed, but only abnormal results are displayed)  Labs Reviewed  BASIC METABOLIC PANEL - Abnormal; Notable for the following:    Calcium 8.7 (*)    All other components within normal limits  URINALYSIS COMPLETEWITH MICROSCOPIC ([facility] ONLY) - Abnormal; Notable for the following:    Color, Urine AMBER (*)    APPearance CLOUDY (*)    Hgb urine dipstick 1+ (*)    Bacteria, UA RARE (*)    Squamous Epithelial / LPF 0-5 (*)    All other components within normal limits  URINE DRUG SCREEN, QUALITATIVE ([facility] ONLY) - Abnormal; Notable for the following:    Opiate, Ur Screen POSITIVE (*)    All other components within normal limits  CBC  ETHANOL    ____________________________________________  EKG  None ____________________________________________  RADIOLOGY  None ____________________________________________   PROCEDURES  Procedure(s) performed: None  Critical Care performed: No  ____________________________________________   INITIAL IMPRESSION / ASSESSMENT AND PLAN / ED COURSE  Pertinent labs & imaging results that were available during my care of the patient were reviewed by me and considered in my medical decision making (see chart for details).  This is a 50 year old male who has a history of depression and anxiety who comes in today with suicidal thoughts and depression. I  will have the patient evaluated by TTS. I will give him a GI cocktail as well as a dose of Ativan and I will reassess the patient.  The patient will receive his home medications and he'll be evaluated by psych. ____________________________________________   FINAL CLINICAL IMPRESSION(S) / ED DIAGNOSES  Final diagnoses:  Suicidal ideation  Depression      Rebecka Apley, MD 04/22/15 1010

## 2015-04-22 NOTE — ED Provider Notes (Signed)
  Physical Exam  BP 131/85 mmHg  Pulse 89  Temp(Src) 96.8 F (36 C) (Oral)  Resp 16  Ht 6' (1.829 m)  Wt 275 lb (124.739 kg)  BMI 37.29 kg/m2  SpO2 95%  Physical Exam  ED Course  Procedures  MDM Patient seen by Dr. Toni Amend. Patient cleared by psych. Vitals stable. Medically cleared. Will dc home, has follow up.   Richardean Canal, MD 04/22/15 1450

## 2015-04-22 NOTE — ED Notes (Signed)
ED BHU PLACEMENT JUSTIFICATION Is the patient under IVC or is there intent for IVC:  no  Is the patient medically cleared: Yes.   Is there vacancy in the ED BHU: Yes.   Is the population mix appropriate for patient: Yes.   Is the patient awaiting placement in inpatient or outpatient setting:   Has the patient had a psychiatric consult: consult pending  Survey of unit performed for contraband, proper placement and condition of furniture, tampering with fixtures in bathroom, shower, and each patient room: Yes.  ; Findings:  APPEARANCE/BEHAVIOR Calm and cooperative NEURO ASSESSMENT Orientation: oriented x3   Hallucinations: No.None noted (Hallucinations) Speech: Normal Gait: normal RESPIRATORY ASSESSMENT Even  Unlabored respirations  CARDIOVASCULAR ASSESSMENT Pulses equal   regular rate  Skin warm and dry   GASTROINTESTINAL ASSESSMENT no GI complaint EXTREMITIES Full ROM  PLAN OF CARE Provide calm/safe environment. Vital signs assessed twice daily. ED BHU Assessment once each 12-hour shift. Collaborate with intake RN daily or as condition indicates. Assure the ED provider has rounded once each shift. Provide and encourage hygiene. Provide redirection as needed. Assess for escalating behavior; address immediately and inform ED provider.  Assess family dynamic and appropriateness for visitation as needed: Yes.  ; If necessary, describe findings:  Educate the patient/family about BHU procedures/visitation: Yes.  ; If necessary, describe findings:

## 2015-04-22 NOTE — ED Notes (Signed)
BEHAVIORAL HEALTH ROUNDING Patient sleeping: Yes.   Patient alert and oriented: eyes closed  Appears asleep Behavior appropriate: Yes.  ; If no, describe:  Nutrition and fluids offered: Yes  Toileting and hygiene offered: sleeping Sitter present: q 15 minute observations and security monitoring Law enforcement present: yes  ODS 

## 2015-04-22 NOTE — Progress Notes (Signed)
In consultation with Dr Toni Amend, this worker will advocate with patients pharmacy and call over to medication Management to see if he can obtain a weeks worth of medication.   [employer] LCSW [phone]

## 2015-04-22 NOTE — ED Notes (Signed)

## 2015-04-22 NOTE — ED Notes (Signed)
Pt brought in a knife, knife was locked up, key is in pyxis.

## 2015-04-22 NOTE — ED Notes (Signed)

## 2015-04-22 NOTE — ED Notes (Signed)
Knife found in belongings, locked up with ODS key in pyxis.

## 2015-04-22 NOTE — Consult Note (Signed)
  Psychiatry: Follow-up on this patient who is voluntarily in the emergency room. Social work has managed to find a way for him to receive a week's worth of his medicine. This is been worked out and conveyed to the patient. He does not meet commitment criteria does not require inpatient treatment. I have discussed this with the emergency room physician and the patient can be discharged and will follow-up with his outpatient providers in the community.

## 2015-04-22 NOTE — Discharge Instructions (Signed)
Take your medicines as prescribed  See your psychiatrist.   Return to ER if you have thoughts of harming yourself or others, hallucinations.

## 2015-04-22 NOTE — ED Notes (Signed)
Pt given his nightly dose of Lyrica that he missed from last night.

## 2015-04-28 ENCOUNTER — Encounter: Payer: Self-pay | Admitting: *Deleted

## 2015-04-28 ENCOUNTER — Emergency Department
Admission: EM | Admit: 2015-04-28 | Discharge: 2015-04-29 | Disposition: A | Discharge status: Home / Self Care | Payer: Medicaid Other | Attending: Emergency Medicine | Admitting: Emergency Medicine

## 2015-04-28 DIAGNOSIS — F17200 Nicotine dependence, unspecified, uncomplicated: Secondary | ICD-10-CM | POA: Insufficient documentation

## 2015-04-28 DIAGNOSIS — Z760 Encounter for issue of repeat prescription: Secondary | ICD-10-CM | POA: Insufficient documentation

## 2015-04-28 DIAGNOSIS — Z79899 Other long term (current) drug therapy: Secondary | ICD-10-CM | POA: Insufficient documentation

## 2015-04-28 DIAGNOSIS — F319 Bipolar disorder, unspecified: Secondary | ICD-10-CM | POA: Insufficient documentation

## 2015-04-28 DIAGNOSIS — I10 Essential (primary) hypertension: Secondary | ICD-10-CM | POA: Insufficient documentation

## 2015-04-28 MED ORDER — ESCITALOPRAM OXALATE 10 MG PO TABS
20.0000 mg | ORAL_TABLET | Freq: Once | ORAL | Status: AC
  Administered 2015-04-29: 20 mg via ORAL
  Filled 2015-04-28: qty 2

## 2015-04-28 MED ORDER — AMITRIPTYLINE HCL 75 MG PO TABS
150.0000 mg | ORAL_TABLET | Freq: Once | ORAL | Status: AC
  Administered 2015-04-29: 150 mg via ORAL
  Filled 2015-04-28: qty 2

## 2015-04-28 MED ORDER — CYCLOBENZAPRINE HCL 10 MG PO TABS
10.0000 mg | ORAL_TABLET | Freq: Once | ORAL | Status: AC
  Administered 2015-04-29: 10 mg via ORAL
  Filled 2015-04-28: qty 1

## 2015-04-28 MED ORDER — PREGABALIN 75 MG PO CAPS
150.0000 mg | ORAL_CAPSULE | Freq: Once | ORAL | Status: AC
  Administered 2015-04-29: 150 mg via ORAL
  Filled 2015-04-28: qty 2

## 2015-04-28 MED ORDER — IBUPROFEN 800 MG PO TABS
800.0000 mg | ORAL_TABLET | Freq: Once | ORAL | Status: AC
  Administered 2015-04-29: 800 mg via ORAL
  Filled 2015-04-28: qty 1

## 2015-04-28 MED ORDER — LURASIDONE HCL 80 MG PO TABS
80.0000 mg | ORAL_TABLET | Freq: Every day | ORAL | Status: DC
  Administered 2015-04-29: 80 mg via ORAL
  Filled 2015-04-28: qty 1

## 2015-04-28 NOTE — ED Notes (Signed)
Pt reports locked out of boarding house for 5 days.  Pt is unable to get his medications.  Pt requesting medication refill of multiple meds.

## 2015-04-28 NOTE — ED Notes (Signed)
Pt states he was evicted and cannot get to medications till Wednesday, states here for med refill.

## 2015-04-29 NOTE — Discharge Instructions (Signed)
You were given a dose of your medications today as they were recently filled. Please follow up with your primary care physician with medication concerns and please be more careful regarding the whereabouts of your medications in the future.   Medicine Refill at the Emergency Department We have refilled your medicine today, but it is best for you to get refills through your primary health care provider's office. In the future, please plan ahead so you do not need to get refills from the emergency department. If the medicine we refilled was a maintenance medicine, you may have received only enough to get you by until you are able to see your regular health care provider.   This information is not intended to replace advice given to you by your health care provider. Make sure you discuss any questions you have with your health care provider.   Document Released: 06/04/2003 Document Revised: 03/08/2014 Document Reviewed: 05/25/2013 Elsevier Interactive Patient Education [employer]

## 2015-04-29 NOTE — ED Provider Notes (Signed)
[facility] Emergency Department Provider Note  ____________________________________________  Time seen: Approximately 2343 AM  I have reviewed the triage vital signs and the nursing notes.   HISTORY  Chief Complaint Medication Refill    HPI Daniel [name] is a 50 y.o. male who comes into the hospital today to get his medications. The patient was seen here on the 21st and reports that he was discharged and was given resources to have his medications filled. He reports that he did that and placed them in his roomhe reports that he was evicted and the sheriff placed a pad lock on his door. He reports that the [facility] locked all of his possessions as well as his medications into his room and he is unable to get his medications. He reports that his rent has been past due since December and his landlord with whom he had an agreement passed away recently. He reports that he will be able to get access to his medications on March 1 when he gets paid again and he can pay his rent but until then he reports that he feels as though he is unable to cope. He reports that he has been trying to hold out until then but realized tonight that he could not wait until he gets his medications. He reports that he can't sleep and he can't rest and he is hurting everywhere because he has not had his pain medication. He feels as though he is going out of his mind and he feels as though he is not safe. He decided to come here because he was unsure of what he needed to do at this time.   Past Medical History  Diagnosis Date  . Hypertension   . Bipolar 1 disorder (HCC)   . Chronic pain   . Anxiety     Patient Active Problem List   Diagnosis Date Noted  . Noncompliance with treatment plan 03/11/2015  . Major depressive disorder, recurrent severe without psychotic features (HCC) 03/11/2015  . Suicidal ideation 02/24/2015  . Chronic pain 02/24/2015  . Cough 02/24/2015  . Opioid use  disorder, severe, dependence (HCC) 02/24/2015  . Hypertension 08/02/2014  . Gastric reflux 08/02/2014    Past Surgical History  Procedure Laterality Date  . Knee surgery Left     Current Outpatient Rx  Name  Route  Sig  Dispense  Refill  . amitriptyline (ELAVIL) 150 MG tablet   Oral   Take 1 tablet (150 mg total) by mouth at bedtime.   30 tablet   0   . amitriptyline (ELAVIL) 150 MG tablet   Oral   Take 1 tablet (150 mg total) by mouth at bedtime.   8 tablet   0   . cyclobenzaprine (FLEXERIL) 10 MG tablet   Oral   Take 1 tablet (10 mg total) by mouth 2 (two) times daily as needed for muscle spasms.   60 tablet   0   . cyclobenzaprine (FLEXERIL) 10 MG tablet   Oral   Take 1 tablet (10 mg total) by mouth 2 (two) times daily.   16 tablet   0   . escitalopram (LEXAPRO) 20 MG tablet   Oral   Take 1 tablet (20 mg total) by mouth daily.   30 tablet   0   . escitalopram (LEXAPRO) 20 MG tablet   Oral   Take 1 tablet (20 mg total) by mouth daily.   8 tablet   0   . ibuprofen (ADVIL,MOTRIN)  800 MG tablet   Oral   Take 1 tablet (800 mg total) by mouth every 8 (eight) hours as needed for moderate pain.   90 tablet   0   . lurasidone (LATUDA) 80 MG TABS tablet   Oral   Take 1 tablet (80 mg total) by mouth daily with supper.   30 tablet   0   . lurasidone (LATUDA) 80 MG TABS tablet   Oral   Take 1 tablet (80 mg total) by mouth daily with supper.   8 tablet   0   . LYRICA 150 MG capsule   Oral   Take 150 mg by mouth 3 (three) times daily.           Dispense as written.   Marland Kitchen oxyCODONE-acetaminophen (PERCOCET) 10-325 MG tablet   Oral   Take 1 tablet by mouth every 8 (eight) hours as needed for pain. Patient taking differently: Take 1 tablet by mouth every 6 (six) hours as needed for pain.    36 tablet   0   . pregabalin (LYRICA) 150 MG capsule   Oral   Take 1 capsule (150 mg total) by mouth 3 (three) times daily.   24 capsule   0   . traZODone  (DESYREL) 100 MG tablet   Oral   Take 2 tablets (200 mg total) by mouth at bedtime.   30 tablet   0   . traZODone (DESYREL) 100 MG tablet   Oral   Take 2 tablets (200 mg total) by mouth at bedtime.   16 tablet   0     Allergies Hydrocodone-acetaminophen  No family history on file.  Social History Social History  Substance Use Topics  . Smoking status: Current Every Day Smoker  . Smokeless tobacco: Never Used  . Alcohol Use: No    Review of Systems Constitutional: No fever/chills Eyes: No visual changes. ENT: No sore throat. Cardiovascular: Denies chest pain. Respiratory: Denies shortness of breath. Gastrointestinal: No abdominal pain.  No nausea, no vomiting.  No diarrhea.  No constipation. Genitourinary: Negative for dysuria. Musculoskeletal: Negative for back pain. Skin: Negative for rash. Neurological: Negative for headaches, focal weakness or numbness.  10-point ROS otherwise negative.  ____________________________________________   PHYSICAL EXAM:  VITAL SIGNS: ED Triage Vitals  Enc Vitals Group     BP 04/28/15 2241 133/85 mmHg     Pulse Rate 04/28/15 2241 121     Resp 04/28/15 2241 20     Temp 04/28/15 2241 98.2 F (36.8 C)     Temp Source 04/28/15 2241 Oral     SpO2 04/28/15 2241 95 %     Weight 04/28/15 2241 275 lb (124.739 kg)     Height 04/28/15 2241 6' (1.829 m)     Head Cir --      Peak Flow --      Pain Score 04/28/15 2242 8     Pain Loc --      Pain Edu? --      Excl. in GC? --     Constitutional: Alert and oriented. Well appearing and in no acute distress. Eyes: Conjunctivae are normal. PERRL. EOMI. Head: Atraumatic. Nose: No congestion/rhinnorhea. Mouth/Throat: Mucous membranes are moist.  Oropharynx non-erythematous. Cardiovascular: Normal rate, regular rhythm. Grossly normal heart sounds.  Good peripheral circulation. Respiratory: Normal respiratory effort.  No retractions. Lungs CTAB. Gastrointestinal: Soft and nontender. No  distention. Positive bowel sounds Musculoskeletal: No lower extremity tenderness nor edema.   Neurologic:  Normal speech  and language. No gross focal neurologic deficits are appreciated. No gait instability. Skin:  Skin is warm, dry and intact.  Psychiatric: Mood and affect are normal.   ____________________________________________   LABS (all labs ordered are listed, but only abnormal results are displayed)  Labs Reviewed - No data to display ____________________________________________  EKG  None ____________________________________________  RADIOLOGY  None ____________________________________________   PROCEDURES  Procedure(s) performed: None  Critical Care performed: No  ____________________________________________   INITIAL IMPRESSION / ASSESSMENT AND PLAN / ED COURSE  Pertinent labs & imaging results that were available during my care of the patient were reviewed by me and considered in my medical decision making (see chart for details).  This is a 50 year old male who comes into the hospital today because he has not had access to his medications since Thursday. I discussed with the patient that we typically do not do refills of medications while here in the emergency department and he should've attempted to follow-up with his doctor or with the pharmacy but he reports that they would not give him any other medications. I did inform the patient that I would if him a dose of his psychiatric medications and his Flexeril but I will not give him any narcotic medications. The patient will receive his dose of Latuda 80 mg, amitriptyline 150 mg, cyclobenzaprine 10 mg, Lexapro 20 mg, ibuprofen 800 mg and Lyrica 150 mg. The patient will then be discharged to home to follow back up with his primary care physician. ____________________________________________   FINAL CLINICAL IMPRESSION(S) / ED DIAGNOSES  Final diagnoses:  Medication refill      Rebecka Apley,  MD 04/29/15 0121

## 2015-05-22 ENCOUNTER — Other Ambulatory Visit: Payer: Self-pay | Admitting: Psychiatry

## 2015-06-19 ENCOUNTER — Encounter: Payer: Self-pay | Admitting: Emergency Medicine

## 2015-06-19 ENCOUNTER — Observation Stay
Admission: EM | Admit: 2015-06-19 | Discharge: 2015-06-20 | Payer: Medicaid Other | Attending: Internal Medicine | Admitting: Internal Medicine

## 2015-06-19 DIAGNOSIS — T426X2A Poisoning by other antiepileptic and sedative-hypnotic drugs, intentional self-harm, initial encounter: Secondary | ICD-10-CM | POA: Insufficient documentation

## 2015-06-19 DIAGNOSIS — R45851 Suicidal ideations: Secondary | ICD-10-CM | POA: Insufficient documentation

## 2015-06-19 DIAGNOSIS — Z791 Long term (current) use of non-steroidal anti-inflammatories (NSAID): Secondary | ICD-10-CM | POA: Diagnosis not present

## 2015-06-19 DIAGNOSIS — G8929 Other chronic pain: Secondary | ICD-10-CM | POA: Diagnosis not present

## 2015-06-19 DIAGNOSIS — I10 Essential (primary) hypertension: Secondary | ICD-10-CM | POA: Diagnosis not present

## 2015-06-19 DIAGNOSIS — Z885 Allergy status to narcotic agent status: Secondary | ICD-10-CM | POA: Insufficient documentation

## 2015-06-19 DIAGNOSIS — F1120 Opioid dependence, uncomplicated: Secondary | ICD-10-CM | POA: Insufficient documentation

## 2015-06-19 DIAGNOSIS — T50902A Poisoning by unspecified drugs, medicaments and biological substances, intentional self-harm, initial encounter: Secondary | ICD-10-CM | POA: Diagnosis present

## 2015-06-19 DIAGNOSIS — K219 Gastro-esophageal reflux disease without esophagitis: Secondary | ICD-10-CM | POA: Insufficient documentation

## 2015-06-19 DIAGNOSIS — R42 Dizziness and giddiness: Secondary | ICD-10-CM | POA: Diagnosis not present

## 2015-06-19 DIAGNOSIS — Z79899 Other long term (current) drug therapy: Secondary | ICD-10-CM | POA: Insufficient documentation

## 2015-06-19 DIAGNOSIS — F419 Anxiety disorder, unspecified: Secondary | ICD-10-CM | POA: Diagnosis not present

## 2015-06-19 DIAGNOSIS — F319 Bipolar disorder, unspecified: Principal | ICD-10-CM | POA: Insufficient documentation

## 2015-06-19 DIAGNOSIS — M549 Dorsalgia, unspecified: Secondary | ICD-10-CM | POA: Insufficient documentation

## 2015-06-19 DIAGNOSIS — F17200 Nicotine dependence, unspecified, uncomplicated: Secondary | ICD-10-CM | POA: Diagnosis not present

## 2015-06-19 DIAGNOSIS — R441 Visual hallucinations: Secondary | ICD-10-CM | POA: Insufficient documentation

## 2015-06-19 DIAGNOSIS — F332 Major depressive disorder, recurrent severe without psychotic features: Secondary | ICD-10-CM | POA: Diagnosis not present

## 2015-06-19 DIAGNOSIS — G92 Toxic encephalopathy: Secondary | ICD-10-CM | POA: Diagnosis not present

## 2015-06-19 LAB — COMPREHENSIVE METABOLIC PANEL
ALBUMIN: 3.9 g/dL (ref 3.5–5.0)
ALK PHOS: 65 U/L (ref 38–126)
ALT: 17 U/L (ref 17–63)
AST: 21 U/L (ref 15–41)
Anion gap: 10 (ref 5–15)
BUN: 11 mg/dL (ref 6–20)
CALCIUM: 8.6 mg/dL — AB (ref 8.9–10.3)
CO2: 23 mmol/L (ref 22–32)
CREATININE: 0.93 mg/dL (ref 0.61–1.24)
Chloride: 106 mmol/L (ref 101–111)
GFR calc Af Amer: >60 mL/min (ref >60)
GFR calc non Af Amer: >60 mL/min (ref >60)
GLUCOSE: 157 mg/dL — AB (ref 65–99)
Potassium: 3.5 mmol/L (ref 3.5–5.1)
SODIUM: 139 mmol/L (ref 135–145)
Total Bilirubin: 0.4 mg/dL (ref 0.3–1.2)
Total Protein: 6.6 g/dL (ref 6.5–8.1)

## 2015-06-19 LAB — HEMOGLOBIN A1C: Hgb A1c MFr Bld: 5.2 % (ref 4.0–6.0)

## 2015-06-19 LAB — URINE DRUG SCREEN, QUALITATIVE (ARMC ONLY)
Amphetamines, Ur Screen: NOT DETECTED
Barbiturates, Ur Screen: NOT DETECTED
Benzodiazepine, Ur Scrn: NOT DETECTED
Cannabinoid 50 Ng, Ur ~~LOC~~: NOT DETECTED
Cocaine Metabolite,Ur ~~LOC~~: POSITIVE — AB
MDMA (Ecstasy)Ur Screen: NOT DETECTED
Methadone Scn, Ur: NOT DETECTED
Opiate, Ur Screen: NOT DETECTED
Phencyclidine (PCP) Ur S: NOT DETECTED
Tricyclic, Ur Screen: POSITIVE — AB

## 2015-06-19 LAB — SALICYLATE LEVEL: Salicylate Lvl: <4 mg/dL (ref 2.8–30.0)

## 2015-06-19 LAB — CBC
HEMATOCRIT: 39.5 % — AB (ref 40.0–52.0)
HEMOGLOBIN: 13.5 g/dL (ref 13.0–18.0)
MCH: 30.3 pg (ref 26.0–34.0)
MCHC: 34.2 g/dL (ref 32.0–36.0)
MCV: 88.6 fL (ref 80.0–100.0)
Platelets: 264 10*3/uL (ref 150–440)
RBC: 4.46 MIL/uL (ref 4.40–5.90)
RDW: 14.2 % (ref 11.5–14.5)
WBC: 6.7 10*3/uL (ref 3.8–10.6)

## 2015-06-19 LAB — TSH: TSH: 0.885 u[IU]/mL (ref 0.350–4.500)

## 2015-06-19 LAB — ACETAMINOPHEN LEVEL

## 2015-06-19 LAB — ETHANOL: Alcohol, Ethyl (B): <5 mg/dL (ref <5)

## 2015-06-19 MED ORDER — ENOXAPARIN SODIUM 40 MG/0.4ML ~~LOC~~ SOLN
40.0000 mg | SUBCUTANEOUS | Status: DC
  Administered 2015-06-20: 06:00:00 40 mg via SUBCUTANEOUS
  Filled 2015-06-19: qty 0.4

## 2015-06-19 MED ORDER — MORPHINE SULFATE (PF) 2 MG/ML IV SOLN: 2.0000 mg | INTRAVENOUS | Status: DC | PRN

## 2015-06-19 MED ORDER — LORAZEPAM 1 MG PO TABS: 1.0000 mg | ORAL_TABLET | ORAL | Status: DC | PRN

## 2015-06-19 MED ORDER — CLONAZEPAM 0.5 MG PO TABS
0.2500 mg | ORAL_TABLET | Freq: Once | ORAL | Status: AC
  Administered 2015-06-19: 0.25 mg via ORAL
  Filled 2015-06-19: qty 1

## 2015-06-19 MED ORDER — OXYCODONE-ACETAMINOPHEN 10-325 MG PO TABS: 1.0000 | ORAL_TABLET | Freq: Three times a day (TID) | ORAL | Status: DC | PRN

## 2015-06-19 MED ORDER — AMITRIPTYLINE HCL 50 MG PO TABS
150.0000 mg | ORAL_TABLET | Freq: Every day | ORAL | Status: DC
  Filled 2015-06-19: qty 2

## 2015-06-19 MED ORDER — LORAZEPAM 2 MG/ML IJ SOLN
1.0000 mg | Freq: Once | INTRAMUSCULAR | Status: AC
  Administered 2015-06-19: 1 mg via INTRAVENOUS
  Filled 2015-06-19: qty 1

## 2015-06-19 MED ORDER — OXYCODONE HCL 5 MG PO TABS: 5.0000 mg | ORAL_TABLET | Freq: Four times a day (QID) | ORAL | Status: DC | PRN

## 2015-06-19 MED ORDER — SCOPOLAMINE 1 MG/3DAYS TD PT72
1.0000 | MEDICATED_PATCH | TRANSDERMAL | Status: DC
  Administered 2015-06-19: 1.5 mg via TRANSDERMAL
  Filled 2015-06-19: qty 1

## 2015-06-19 MED ORDER — LURASIDONE HCL 40 MG PO TABS
80.0000 mg | ORAL_TABLET | Freq: Every day | ORAL | Status: DC
  Administered 2015-06-19: 80 mg via ORAL
  Filled 2015-06-19: qty 2

## 2015-06-19 MED ORDER — IBUPROFEN 400 MG PO TABS: 800.0000 mg | ORAL_TABLET | Freq: Three times a day (TID) | ORAL | Status: DC | PRN

## 2015-06-19 MED ORDER — OXYCODONE-ACETAMINOPHEN 5-325 MG PO TABS
2.0000 | ORAL_TABLET | Freq: Four times a day (QID) | ORAL | Status: DC
  Administered 2015-06-19 – 2015-06-20 (×3): 2 via ORAL
  Filled 2015-06-19 (×4): qty 2

## 2015-06-19 MED ORDER — SODIUM CHLORIDE 0.9 % IV SOLN
INTRAVENOUS | Status: DC
  Administered 2015-06-19 – 2015-06-20 (×3): via INTRAVENOUS

## 2015-06-19 MED ORDER — TRAZODONE HCL 100 MG PO TABS: 200.0000 mg | ORAL_TABLET | Freq: Every day | ORAL | Status: DC

## 2015-06-19 MED ORDER — ACETAMINOPHEN 650 MG RE SUPP: 650.0000 mg | Freq: Four times a day (QID) | RECTAL | Status: DC | PRN

## 2015-06-19 MED ORDER — CYCLOBENZAPRINE HCL 10 MG PO TABS
10.0000 mg | ORAL_TABLET | Freq: Two times a day (BID) | ORAL | Status: DC
  Filled 2015-06-19: qty 1

## 2015-06-19 MED ORDER — SODIUM CHLORIDE 0.9% FLUSH
3.0000 mL | Freq: Two times a day (BID) | INTRAVENOUS | Status: DC
  Administered 2015-06-19: 3 mL via INTRAVENOUS

## 2015-06-19 MED ORDER — ACETAMINOPHEN 325 MG PO TABS: 650.0000 mg | ORAL_TABLET | Freq: Four times a day (QID) | ORAL | Status: DC | PRN

## 2015-06-19 MED ORDER — ESCITALOPRAM OXALATE 10 MG PO TABS
20.0000 mg | ORAL_TABLET | Freq: Every day | ORAL | Status: DC
  Filled 2015-06-19: qty 2

## 2015-06-19 MED ORDER — OXYCODONE-ACETAMINOPHEN 5-325 MG PO TABS: 1.0000 | ORAL_TABLET | Freq: Four times a day (QID) | ORAL | Status: DC | PRN

## 2015-06-19 NOTE — Progress Notes (Signed)
Patient is complaining of anxiety and having hallucinations, notified MD. Orders received.

## 2015-06-19 NOTE — ED Notes (Signed)
Gave pt cup of ice water and pt aspirated on cup of ice water with red emesis noted. MD made aware.

## 2015-06-19 NOTE — Consult Note (Signed)
[facility] Face-to-Face Psychiatry Consult   Reason for Consult:  Consult for 50 year old man with a history of recurrent severe depression currently in the hospital after overdose on Lyrica with suicidal intent. Referring Physician:  Leslye Peer Patient Identification: Daniel [name] MRN:  [id number] Principal Diagnosis: Major depressive disorder, recurrent severe without psychotic features [facility]) Diagnosis:   Patient Active Problem List   Diagnosis Date Noted  . Overdose [T50.901A] 06/19/2015  . Noncompliance with treatment plan [Z91.11] 03/11/2015  . Major depressive disorder, recurrent severe without psychotic features ([place]) [F33.2] 03/11/2015  . Suicidal ideation [R45.851] 02/24/2015  . Chronic pain [G89.29] 02/24/2015  . Cough [R05] 02/24/2015  . Opioid use disorder, severe, dependence ([place]) [F11.20] 02/24/2015  . Hypertension [I10] 08/02/2014  . Gastric reflux [K21.9] 08/02/2014    Total Time spent with patient: 1 hour  Subjective:   Daniel [name] is a 50 y.o. male patient admitted with "I really want to die".  HPI:  Patient interviewed. Old chart reviewed. Labs and vitals reviewed. 50 year old man came into the hospital after taking an overdose of Lyrica. He reports that he took about 60 of his 150 mg pills and then called his peer support person. He claims to believe that the peer support person would not have called an ambulance. Obviously the peer support person did call an ambulance. Patient says he genuinely wanted to die. Mood has been even more depressed than usual recently. Recently had a birthday and none of his family got in touch with him which made him more upset. Sleep is poor. Energy poor. Appetite poor. He tells me that he's been having visual hallucinations recently seeing a figure who talks to him at times. No homicidal ideation. He claims that he's been compliant with his outpatient medication. Claims that he is not abusing any drugs or alcohol currently. Patient states that  after he left the hospital last time he took his [place] for a month but then was not able to get a follow-up appointment with his outpatient psychiatrist. For that reason he has been off the [place] for the last 2 months and he says he thinks this is part of the reason he is feeling so bad.  Social history: Patient lives alone in a boarding house. Estranged from all of his family. Lonely isolated.  Medical history: Chronic pain gastric reflux hypertension history of opiate use disorder  Substance abuse history: History of abuse of opiate pain medicine history of abuse of controlled substances.  Past Psychiatric History: Patient has had multiple previous psychiatric hospitalizations. Multiple prior suicide attempts. Has been on medications including antipsychotics and antidepressants. He claims that he thinks the [place] that he was recently prescribed was very helpful.  Risk to Self: Is patient at risk for suicide?: Yes Risk to Others:   Prior Inpatient Therapy:   Prior Outpatient Therapy:    Past Medical History:  Past Medical History  Diagnosis Date  . Hypertension   . Bipolar 1 disorder ([place])   . Chronic pain   . Anxiety     Past Surgical History  Procedure Laterality Date  . Knee surgery Left    Family History:  Family History  Problem Relation Age of Onset  .      Family Psychiatric  History: Positive for depression Social History:  History  Alcohol Use No     History  Drug Use No    Social History   Social History  . Marital Status: Divorced    Spouse Name: N/A  .  Number of Children: N/A  . Years of Education: N/A   Social History Main Topics  . Smoking status: Current Every Day Smoker  . Smokeless tobacco: Never Used  . Alcohol Use: No  . Drug Use: No  . Sexual Activity: Not Asked   Other Topics Concern  . None   Social History Narrative   Additional Social History:    Allergies:   Allergies  Allergen Reactions  . Hydrocodone-Acetaminophen Hives     Vicodin/Norco    Labs:  Results for orders placed or performed during the hospital encounter of 06/19/15 (from the past 48 hour(s))  Comprehensive metabolic panel     Status: Abnormal   Collection Time: 06/19/15  3:58 AM  Result Value Ref Range   Sodium 139 135 - 145 mmol/L   Potassium 3.5 3.5 - 5.1 mmol/L   Chloride 106 101 - 111 mmol/L   CO2 23 22 - 32 mmol/L   Glucose, Bld 157 (H) 65 - 99 mg/dL   BUN 11 6 - 20 mg/dL   Creatinine, Ser 0.93 0.61 - 1.24 mg/dL   Calcium 8.6 (L) 8.9 - 10.3 mg/dL   Total Protein 6.6 6.5 - 8.1 g/dL   Albumin 3.9 3.5 - 5.0 g/dL   AST 21 15 - 41 U/L   ALT 17 17 - 63 U/L   Alkaline Phosphatase 65 38 - 126 U/L   Total Bilirubin 0.4 0.3 - 1.2 mg/dL   GFR calc non Af Amer >60 >60 mL/min   GFR calc Af Amer >60 >60 mL/min    Comment: (NOTE) The eGFR has been calculated using the CKD EPI equation. This calculation has not been validated in all clinical situations. eGFR's persistently <60 mL/min signify possible Chronic Kidney Disease.    Anion gap 10 5 - 15  Ethanol (ETOH)     Status: None   Collection Time: 06/19/15  3:58 AM  Result Value Ref Range   Alcohol, Ethyl (B) <5 <5 mg/dL    Comment:        LOWEST DETECTABLE LIMIT FOR SERUM ALCOHOL IS 5 mg/dL FOR MEDICAL PURPOSES ONLY   Salicylate level     Status: None   Collection Time: 06/19/15  3:58 AM  Result Value Ref Range   Salicylate Lvl <5.7 2.8 - 30.0 mg/dL  Acetaminophen level     Status: Abnormal   Collection Time: 06/19/15  3:58 AM  Result Value Ref Range   Acetaminophen (Tylenol), Serum <10 (L) 10 - 30 ug/mL    Comment:        THERAPEUTIC CONCENTRATIONS VARY SIGNIFICANTLY. A RANGE OF 10-30 ug/mL MAY BE AN EFFECTIVE CONCENTRATION FOR MANY PATIENTS. HOWEVER, SOME ARE BEST TREATED AT CONCENTRATIONS OUTSIDE THIS RANGE. ACETAMINOPHEN CONCENTRATIONS >150 ug/mL AT 4 HOURS AFTER INGESTION AND >50 ug/mL AT 12 HOURS AFTER INGESTION ARE OFTEN ASSOCIATED WITH TOXIC REACTIONS.   CBC      Status: Abnormal   Collection Time: 06/19/15  3:58 AM  Result Value Ref Range   WBC 6.7 3.8 - 10.6 K/uL   RBC 4.46 4.40 - 5.90 MIL/uL   Hemoglobin 13.5 13.0 - 18.0 g/dL   HCT 39.5 (L) 40.0 - 52.0 %   MCV 88.6 80.0 - 100.0 fL   MCH 30.3 26.0 - 34.0 pg   MCHC 34.2 32.0 - 36.0 g/dL   RDW 14.2 11.5 - 14.5 %   Platelets 264 150 - 440 K/uL  TSH     Status: None   Collection Time: 06/19/15  3:58  AM  Result Value Ref Range   TSH 0.885 0.350 - 4.500 uIU/mL  Hemoglobin A1c     Status: None   Collection Time: 06/19/15  3:58 AM  Result Value Ref Range   Hgb A1c MFr Bld 5.2 4.0 - 6.0 %    Current Facility-Administered Medications  Medication Dose Route Frequency Provider Last Rate Last Dose  . 0.9 %  sodium chloride infusion   Intravenous Continuous Loletha Grayer, MD 100 mL/hr at 06/19/15 1900    . enoxaparin (LOVENOX) injection 40 mg  40 mg Subcutaneous Q24H Harrie Foreman, MD   40 mg at 06/19/15 0630  . escitalopram (LEXAPRO) tablet 20 mg  20 mg Oral Daily Harrie Foreman, MD   20 mg at 06/19/15 1000  . lurasidone (LATUDA) tablet 80 mg  80 mg Oral Q supper Harrie Foreman, MD   80 mg at 06/19/15 2009  . oxyCODONE-acetaminophen (PERCOCET/ROXICET) 5-325 MG per tablet 2 tablet  2 tablet Oral Q6H Gonzella Lex, MD   2 tablet at 06/19/15 1752  . sodium chloride flush (NS) 0.9 % injection 3 mL  3 mL Intravenous Q12H Harrie Foreman, MD   3 mL at 06/19/15 1000    Musculoskeletal: Strength & Muscle Tone: within normal limits Gait & Station: unsteady Patient leans: N/A  Psychiatric Specialty Exam: Review of Systems  Constitutional: Negative.   HENT: Negative.   Eyes: Negative.   Respiratory: Negative.   Cardiovascular: Negative.   Gastrointestinal: Negative.   Musculoskeletal: Negative.   Skin: Negative.   Neurological: Negative.   Psychiatric/Behavioral: Positive for depression, suicidal ideas, hallucinations and memory loss. Negative for substance abuse. The patient is  nervous/anxious and has insomnia.     Blood pressure 110/78, pulse 106, temperature 97.9 F (36.6 C), temperature source Oral, resp. rate 16, height 6' 1"  (1.854 m), weight 127.461 kg (281 lb), SpO2 97 %.Body mass index is 37.08 kg/(m^2).  General Appearance: Disheveled  Eye Sport and exercise psychologist::  Fair  Speech:  Slow  Volume:  Decreased  Mood:  Depressed  Affect:  Depressed  Thought Process:  Tangential  Orientation:  Full (Time, Place, and Person)  Thought Content:  Hallucinations: Visual  Suicidal Thoughts:  Yes.  with intent/plan  Homicidal Thoughts:  No  Memory:  Immediate;   Good Recent;   Fair Remote;   Fair  Judgement:  Fair  Insight:  Fair  Psychomotor Activity:  Decreased  Concentration:  Fair  Recall:  [employer] of Knowledge:Fair  Language: Fair  Akathisia:  No  Handed:  Right  AIMS (if indicated):     Assets:  Communication Skills  ADL's:  Impaired  Cognition: WNL  Sleep:      Treatment Plan Summary: Daily contact with patient to assess and evaluate symptoms and progress in treatment, Medication management and Plan Patient is restarted on his psychiatric medicines including the Latuda and the Lexapro. Also continue his standing outpatient pain medicine. He will likely need transfer to the psychiatric ward tomorrow. Today he was still a little bit unstable and confused and recovering from the Lyrica overdose. If he is able to stand adequately we can probably look at transfer tomorrow. Patient agrees to the plan. No other change to medicine for now.  Disposition: Recommend psychiatric Inpatient admission when medically cleared. Supportive therapy provided about ongoing stressors.  Alethia Berthold, MD 06/19/2015 8:18 PM

## 2015-06-19 NOTE — ED Provider Notes (Signed)
[facility] Emergency Department Provider Note  ____________________________________________  Time seen: 3:45 AM  I have reviewed the triage vital signs and the nursing notes.   HISTORY  Chief Complaint Drug Overdose      HPI Daniel [name] is a 50 y.o. male history of hypertension chronic pain bipolar disorder and anxiety presents via EMS with history of intentional medication overdose suicide attempt. Patient states that he took approximately a handful of Lyrica tonight. Based on quantity missing from the bottle approximately 9150 mg of Lyrica ingested. Patient states that his intent was to commit suicide by doing so.    Past Medical History  Diagnosis Date  . Hypertension   . Bipolar 1 disorder ([facility])   . Chronic pain   . Anxiety     Patient Active Problem List   Diagnosis Date Noted  . Noncompliance with treatment plan 03/11/2015  . Major depressive disorder, recurrent severe without psychotic features ([facility]) 03/11/2015  . Suicidal ideation 02/24/2015  . Chronic pain 02/24/2015  . Cough 02/24/2015  . Opioid use disorder, severe, dependence ([facility]) 02/24/2015  . Hypertension 08/02/2014  . Gastric reflux 08/02/2014    Past Surgical History  Procedure Laterality Date  . Knee surgery Left     Current Outpatient Rx  Name  Route  Sig  Dispense  Refill  . amitriptyline (ELAVIL) 150 MG tablet   Oral   Take 1 tablet (150 mg total) by mouth at bedtime.   30 tablet   0   . amitriptyline (ELAVIL) 150 MG tablet   Oral   Take 1 tablet (150 mg total) by mouth at bedtime.   8 tablet   0   . cyclobenzaprine (FLEXERIL) 10 MG tablet   Oral   Take 1 tablet (10 mg total) by mouth 2 (two) times daily as needed for muscle spasms.   60 tablet   0   . cyclobenzaprine (FLEXERIL) 10 MG tablet   Oral   Take 1 tablet (10 mg total) by mouth 2 (two) times daily.   16 tablet   0   . escitalopram (LEXAPRO) 20 MG tablet   Oral   Take 1 tablet (20 mg  total) by mouth daily.   30 tablet   0   . escitalopram (LEXAPRO) 20 MG tablet   Oral   Take 1 tablet (20 mg total) by mouth daily.   8 tablet   0   . ibuprofen (ADVIL,MOTRIN) 800 MG tablet   Oral   Take 1 tablet (800 mg total) by mouth every 8 (eight) hours as needed for moderate pain.   90 tablet   0   . lurasidone (LATUDA) 80 MG TABS tablet   Oral   Take 1 tablet (80 mg total) by mouth daily with supper.   30 tablet   0   . lurasidone (LATUDA) 80 MG TABS tablet   Oral   Take 1 tablet (80 mg total) by mouth daily with supper.   8 tablet   0   . LYRICA 150 MG capsule   Oral   Take 150 mg by mouth 3 (three) times daily.           Dispense as written.   Marland Kitchen. oxyCODONE-acetaminophen (PERCOCET) 10-325 MG tablet   Oral   Take 1 tablet by mouth every 8 (eight) hours as needed for pain. Patient taking differently: Take 1 tablet by mouth every 6 (six) hours as needed for pain.    36 tablet  0   . pregabalin (LYRICA) 150 MG capsule   Oral   Take 1 capsule (150 mg total) by mouth 3 (three) times daily.   24 capsule   0   . traZODone (DESYREL) 100 MG tablet   Oral   Take 2 tablets (200 mg total) by mouth at bedtime.   30 tablet   0   . traZODone (DESYREL) 100 MG tablet   Oral   Take 2 tablets (200 mg total) by mouth at bedtime.   16 tablet   0     Allergies Hydrocodone-acetaminophen  No family history on file.  Social History Social History  Substance Use Topics  . Smoking status: Current Every Day Smoker  . Smokeless tobacco: Never Used  . Alcohol Use: No    Review of Systems  Constitutional: Negative for fever. Eyes: Negative for visual changes. ENT: Negative for sore throat. Cardiovascular: Negative for chest pain. Respiratory: Negative for shortness of breath. Gastrointestinal: Negative for abdominal pain, vomiting and diarrhea. Genitourinary: Negative for dysuria. Musculoskeletal: Negative for back pain. Skin: Negative for  rash. Neurological: Negative for headaches, focal weakness or numbness. Psychiatric:Positive for medication overdose suicide attempt 10-point ROS otherwise negative.  ____________________________________________   PHYSICAL EXAM:  VITAL SIGNS: ED Triage Vitals  Enc Vitals Group     BP 06/19/15 0348 136/76 mmHg     Pulse Rate 06/19/15 0348 95     Resp 06/19/15 0348 10     Temp 06/19/15 0348 99.3 F (37.4 C)     Temp Source 06/19/15 0348 Oral     SpO2 06/19/15 0348 96 %     Weight 06/19/15 0348 281 lb (127.461 kg)     Height 06/19/15 0348  (1.854 m)     Head Cir --      Peak Flow --      Pain Score --      Pain Loc --      Pain Edu? --      Excl. in GC? --     Constitutional: Alert and oriented. Somnolent but easily arousable Eyes: Conjunctivae are normal. PERRL. Normal extraocular movements. ENT   Head: Normocephalic and atraumatic.   Nose: No congestion/rhinnorhea.   Mouth/Throat: Mucous membranes are moist.   Neck: No stridor. Hematological/Lymphatic/Immunilogical: No cervical lymphadenopathy. Cardiovascular: Normal rate, regular rhythm. Normal and symmetric distal pulses are present in all extremities. No murmurs, rubs, or gallops. Respiratory: Normal respiratory effort without tachypnea nor retractions. Breath sounds are clear and equal bilaterally. No wheezes/rales/rhonchi. Gastrointestinal: Soft and nontender. No distention. There is no CVA tenderness. Genitourinary: deferred Musculoskeletal: Nontender with normal range of motion in all extremities. No joint effusions.  No lower extremity tenderness nor edema. Neurologic:  Normal speech and language. No gross focal neurologic deficits are appreciated. Speech is normal.  Skin:  Skin is warm, dry and intact. No rash noted. Psychiatric: Depressed mood. Speech and behavior are normal. Patient exhibits appropriate insight and judgment.  ____________________________________________    LABS (pertinent  positives/negatives)  Labs Reviewed  COMPREHENSIVE METABOLIC PANEL - Abnormal; Notable for the following:    Glucose, Bld 157 (*)    Calcium 8.6 (*)    All other components within normal limits  ACETAMINOPHEN LEVEL - Abnormal; Notable for the following:    Acetaminophen (Tylenol), Serum <10 (*)    All other components within normal limits  CBC - Abnormal; Notable for the following:    HCT 39.5 (*)    All other components within normal limits  ETHANOL  SALICYLATE LEVEL  URINE DRUG SCREEN, QUALITATIVE ([facility] ONLY)     ____________________________________________   EKG  ED ECG REPORT I, Gurabo N Gabrial Domine, the attending physician, personally viewed and interpreted this ECG.   Date: 06/19/2015  EKG Time: 3:53 AM  Rate: 106  Rhythm: Sinus tachycardia  Axis: Normal  Intervals: Normal  ST&T Change: None   _______     INITIAL IMPRESSION / ASSESSMENT AND PLAN / ED COURSE  Pertinent labs & imaging results that were available during my care of the patient were reviewed by me and considered in my medical decision making (see chart for details).  Patient discussed with poison control recommends 24 hours of medical observation  ____________________________________________   FINAL CLINICAL IMPRESSION(S) / ED DIAGNOSES  Final diagnoses:  Overdose, intentional self-harm, initial encounter [facility])      Darci Current, MD 06/19/15 [phone]

## 2015-06-19 NOTE — ED Notes (Addendum)
Pt presents to ED via EMS from personal home with c/o of intentional overdose. Pt states he took a "handful" tonight. From manual count, pt took x61 capsules of 150mg  capsules. Pt alert and oriented x4.

## 2015-06-19 NOTE — ED Notes (Signed)
[facility] contacted by this RN. Representative advised of intentional OD on 9150mg  of Lyrica starting around midnight. Recommendations are as follows: 1. EKG 2. CBC - assess for low platelets 3. APAP level 4. If emesis - check electrolytes 5. If muscle pain  - check CK for rhabdo 6. Monitor for "at least" 24 hours 7. Supportive care - monitor for over sedation and associated HYPOxia; consider intubation if sats drop Recommendations communicated to Dr. Manson PasseyBrown who acknowledged recommendations. [facility]

## 2015-06-19 NOTE — Plan of Care (Signed)
Problem: Safety: Goal: Ability to remain free from injury will improve Outcome: Progressing 1:1 Sitter (IVC)  Problem: Pain Managment: Goal: General experience of comfort will improve Outcome: Progressing oxyCODONE-acetaminophen 2 tablets scheduled dose

## 2015-06-19 NOTE — ED Notes (Signed)
Lyrica tablets taken to pharmacy. Medication storage form sent to 1A. 23 tablets in bottle; verified and signed for by pharmacy.

## 2015-06-19 NOTE — Progress Notes (Signed)
Patient ID: SAMANTHA OLIVERA, male   DOB: [birth year], 50 y.o.   MRN: [id number] [employer] PROGRESS NOTE  GRAM SIEDLECKI WUJ:[id number] DOB: [birth year] DOA: 06/19/2015 PCP: [facility]  HPI/Subjective: Patient to sleepy to talk with me. He mumbles when I sternal rub him.  Objective: Filed Vitals:   06/19/15 0745 06/19/15 1241  BP: 97/60 110/78  Pulse: 98 106  Temp: 98 F (36.7 C) 97.9 F (36.6 C)  Resp: 16     Filed Weights   06/19/15 0348  Weight: 127.461 kg (281 lb)    ROS: Review of Systems  Unable to perform ROS  secondary to overdose with Lyrica Exam: Physical Exam  Constitutional: He appears lethargic.  HENT:  Nose: No mucosal edema.  Mouth/Throat: No oropharyngeal exudate or posterior oropharyngeal edema.  Eyes: Conjunctivae and lids are normal.  Both pupils dilated and reactive to light  Neck: No JVD present. Carotid bruit is not present. No edema present. No thyroid mass and no thyromegaly present.  Cardiovascular: S1 normal and S2 normal.  Exam reveals no gallop.   No murmur heard. Pulses:      Dorsalis pedis pulses are 2+ on the right side, and 2+ on the left side.  Respiratory: No respiratory distress. He has no wheezes. He has no rhonchi. He has no rales.  GI: Soft. Bowel sounds are normal. There is no tenderness.  Musculoskeletal:       Right ankle: He exhibits swelling.       Left ankle: He exhibits swelling.  Lymphadenopathy:    He has no cervical adenopathy.  Neurological: He appears lethargic.  Mumbles with sternal rub  Skin: Skin is warm. No rash noted. Nails show no clubbing.  Psychiatric:  Unable to assess at this time      Data Reviewed: Basic Metabolic Panel:  Recent Labs Lab 06/19/15 0358  NA 139  K 3.5  CL 106  CO2 23  GLUCOSE 157*  BUN 11  CREATININE 0.93  CALCIUM 8.6*   Liver Function Tests:  Recent Labs Lab 06/19/15 0358  AST 21  ALT 17  ALKPHOS 65  BILITOT 0.4  PROT 6.6  ALBUMIN 3.9     CBC:  Recent Labs Lab 06/19/15 0358  WBC 6.7  HGB 13.5  HCT 39.5*  MCV 88.6  PLT 264    Scheduled Meds: . enoxaparin (LOVENOX) injection  40 mg Subcutaneous Q24H  . escitalopram  20 mg Oral Daily  . lurasidone  80 mg Oral Q supper  . sodium chloride flush  3 mL Intravenous Q12H   Continuous Infusions: . sodium chloride 150 mL/hr at 06/19/15 9562    Assessment/Plan:  1. Acute metabolic encephalopathy secondary to drug overdose with Lyrica. Patient's vitals are okay but the patient is less responsive. Continue to monitor closely.case discussed with nursing staff. 2. Drug overdose with Lyrica. Patient took 61 tablets of 75 mg Lyricas per nursing staff.  Patient is on one-to-one suicide precautions. Psychiatry consultation put in. 3. History of chronic pain.When necessary oxycodone. Stop IV morphine. Stop muscle relaxant. 4. History of bipolar disorder continue Latuda and Lexapro if able to take. Stop amitriptyline 5. Impaired fasting glucose check a hemoglobin A1c  Code Status:     Code Status Orders        Start     Ordered   06/19/15 0617  Full code   Continuous     06/19/15 0616    Code Status History    Date Active Date  Inactive Code Status Order ID Comments User Context   03/11/2015  5:55 PM 03/19/2015  4:07 PM Full Code [id number]  Shari ProwsJolanta B Pucilowska, MD Inpatient   02/24/2015  4:20 PM 02/28/2015  3:34 PM Full Code [id number]  Audery AmelJohn T Clapacs, MD Inpatient   05/08/2014  9:54 AM 05/09/2014  3:47 AM Full Code [id number]  Coletta MemosKyle Cabbell, MD HOV     Disposition Plan: likely to the psychiatry floor once more alert  Consultants:  psychiatry  Time spent: 35 minutes  Alford HighlandWIETING, Bronco Mcgrory  [employer]

## 2015-06-19 NOTE — H&P (Signed)
Daniel [name] is an 50 y.o. male.   Chief Complaint: Overdose HPI: The patient presents to emergency department after an overdose of Lyrica. He states that he has been very depressed particularly over his recent eviction notice. He admits to "wanting to end it". Currently he denies chest pain, shortness of breath, nausea, vomiting or diarrhea. He admits to dizziness and back pain. Past medical history is significant for bipolar depression and chronic back pain. He sees pain clinic for his narcotic medications. He states he did not want to take those meds to overdose as he did not want to anger his pain medication providers. Due to the need for medical observation the emergency department staff called for admission.  Past Medical History  Diagnosis Date  . Hypertension   . Bipolar 1 disorder ([place])   . Chronic pain   . Anxiety     Past Surgical History  Procedure Laterality Date  . Knee surgery Left     Family History  Problem Relation Age of Onset  .      Social History:  reports that he has been smoking.  He has never used smokeless tobacco. He reports that he does not drink alcohol or use illicit drugs.  Allergies:  Allergies  Allergen Reactions  . Hydrocodone-Acetaminophen Hives    Vicodin/Norco    Prior to Admission medications   Medication Sig Start Date End Date Taking? Authorizing Provider  amitriptyline (ELAVIL) 150 MG tablet Take 1 tablet (150 mg total) by mouth at bedtime. 03/19/15   Clovis Fredrickson, MD  amitriptyline (ELAVIL) 150 MG tablet Take 1 tablet (150 mg total) by mouth at bedtime. 04/22/15   Gonzella Lex, MD  cyclobenzaprine (FLEXERIL) 10 MG tablet Take 1 tablet (10 mg total) by mouth 2 (two) times daily as needed for muscle spasms. 03/19/15   Clovis Fredrickson, MD  cyclobenzaprine (FLEXERIL) 10 MG tablet Take 1 tablet (10 mg total) by mouth 2 (two) times daily. 04/22/15   Gonzella Lex, MD  escitalopram (LEXAPRO) 20 MG tablet Take 1 tablet (20 mg total)  by mouth daily. 03/19/15   Clovis Fredrickson, MD  escitalopram (LEXAPRO) 20 MG tablet Take 1 tablet (20 mg total) by mouth daily. 04/22/15   Gonzella Lex, MD  ibuprofen (ADVIL,MOTRIN) 800 MG tablet Take 1 tablet (800 mg total) by mouth every 8 (eight) hours as needed for moderate pain. 03/19/15   Clovis Fredrickson, MD  lurasidone (LATUDA) 80 MG TABS tablet Take 1 tablet (80 mg total) by mouth daily with supper. 03/19/15   Clovis Fredrickson, MD  lurasidone (LATUDA) 80 MG TABS tablet Take 1 tablet (80 mg total) by mouth daily with supper. 04/22/15   Gonzella Lex, MD  LYRICA 150 MG capsule Take 150 mg by mouth 3 (three) times daily.    Historical Provider, MD  oxyCODONE-acetaminophen (PERCOCET) 10-325 MG tablet Take 1 tablet by mouth every 8 (eight) hours as needed for pain. Patient taking differently: Take 1 tablet by mouth every 6 (six) hours as needed for pain.  02/27/15   Clovis Fredrickson, MD  pregabalin (LYRICA) 150 MG capsule Take 1 capsule (150 mg total) by mouth 3 (three) times daily. 04/22/15   Gonzella Lex, MD  traZODone (DESYREL) 100 MG tablet Take 2 tablets (200 mg total) by mouth at bedtime. 03/19/15   Clovis Fredrickson, MD  traZODone (DESYREL) 100 MG tablet Take 2 tablets (200 mg total) by mouth at bedtime. 04/22/15  Gonzella Lex, MD     Results for orders placed or performed during the hospital encounter of 06/19/15 (from the past 48 hour(s))  Comprehensive metabolic panel     Status: Abnormal   Collection Time: 06/19/15  3:58 AM  Result Value Ref Range   Sodium 139 135 - 145 mmol/L   Potassium 3.5 3.5 - 5.1 mmol/L   Chloride 106 101 - 111 mmol/L   CO2 23 22 - 32 mmol/L   Glucose, Bld 157 (H) 65 - 99 mg/dL   BUN 11 6 - 20 mg/dL   Creatinine, Ser 0.93 0.61 - 1.24 mg/dL   Calcium 8.6 (L) 8.9 - 10.3 mg/dL   Total Protein 6.6 6.5 - 8.1 g/dL   Albumin 3.9 3.5 - 5.0 g/dL   AST 21 15 - 41 U/L   ALT 17 17 - 63 U/L   Alkaline Phosphatase 65 38 - 126 U/L   Total  Bilirubin 0.4 0.3 - 1.2 mg/dL   GFR calc non Af Amer >60 >60 mL/min   GFR calc Af Amer >60 >60 mL/min    Comment: (NOTE) The eGFR has been calculated using the CKD EPI equation. This calculation has not been validated in all clinical situations. eGFR's persistently <60 mL/min signify possible Chronic Kidney Disease.    Anion gap 10 5 - 15  Ethanol (ETOH)     Status: None   Collection Time: 06/19/15  3:58 AM  Result Value Ref Range   Alcohol, Ethyl (B) <5 <5 mg/dL    Comment:        LOWEST DETECTABLE LIMIT FOR SERUM ALCOHOL IS 5 mg/dL FOR MEDICAL PURPOSES ONLY   Salicylate level     Status: None   Collection Time: 06/19/15  3:58 AM  Result Value Ref Range   Salicylate Lvl <3.4 2.8 - 30.0 mg/dL  Acetaminophen level     Status: Abnormal   Collection Time: 06/19/15  3:58 AM  Result Value Ref Range   Acetaminophen (Tylenol), Serum <10 (L) 10 - 30 ug/mL    Comment:        THERAPEUTIC CONCENTRATIONS VARY SIGNIFICANTLY. A RANGE OF 10-30 ug/mL MAY BE AN EFFECTIVE CONCENTRATION FOR MANY PATIENTS. HOWEVER, SOME ARE BEST TREATED AT CONCENTRATIONS OUTSIDE THIS RANGE. ACETAMINOPHEN CONCENTRATIONS >150 ug/mL AT 4 HOURS AFTER INGESTION AND >50 ug/mL AT 12 HOURS AFTER INGESTION ARE OFTEN ASSOCIATED WITH TOXIC REACTIONS.   CBC     Status: Abnormal   Collection Time: 06/19/15  3:58 AM  Result Value Ref Range   WBC 6.7 3.8 - 10.6 K/uL   RBC 4.46 4.40 - 5.90 MIL/uL   Hemoglobin 13.5 13.0 - 18.0 g/dL   HCT 39.5 (L) 40.0 - 52.0 %   MCV 88.6 80.0 - 100.0 fL   MCH 30.3 26.0 - 34.0 pg   MCHC 34.2 32.0 - 36.0 g/dL   RDW 14.2 11.5 - 14.5 %   Platelets 264 150 - 440 K/uL   No results found.  Review of Systems  Constitutional: Negative for fever and chills.  HENT: Negative for sore throat and tinnitus.   Eyes: Negative for blurred vision and redness.  Respiratory: Negative for cough and shortness of breath.   Cardiovascular: Negative for chest pain, palpitations, orthopnea and PND.   Gastrointestinal: Negative for nausea, vomiting, abdominal pain and diarrhea.  Genitourinary: Negative for dysuria, urgency and frequency.  Musculoskeletal: Positive for back pain. Negative for myalgias and joint pain.  Skin: Negative for rash.  No lesions  Neurological: Positive for dizziness. Negative for speech change, focal weakness and weakness.  Endo/Heme/Allergies: Does not bruise/bleed easily.       No temperature intolerance  Psychiatric/Behavioral: Negative for depression and suicidal ideas.    Blood pressure 110/75, pulse 103, temperature 99.3 F (37.4 C), temperature source Oral, resp. rate 11, height _0  (1.854 m), weight 127.461 kg (281 lb), SpO2 97 %. Physical Exam  Vitals reviewed. Constitutional: He is oriented to person, place, and time. He appears well-developed and well-nourished. No distress.  HENT:  Head: Normocephalic and atraumatic.  Mouth/Throat: Oropharynx is clear and moist.  Eyes: Conjunctivae and EOM are normal. Pupils are equal, round, and reactive to light. No scleral icterus.  Neck: Normal range of motion. Neck supple. No JVD present. No tracheal deviation present. No thyromegaly present.  Cardiovascular: Normal rate, regular rhythm and normal heart sounds.  Exam reveals no gallop and no friction rub.   No murmur heard. Respiratory: Effort normal and breath sounds normal. No respiratory distress.  GI: Soft. Bowel sounds are normal. He exhibits no distension. There is no tenderness.  Genitourinary:  Deferred  Musculoskeletal: Normal range of motion. He exhibits no edema.  Lymphadenopathy:    He has no cervical adenopathy.  Neurological: He is alert and oriented to person, place, and time. No cranial nerve deficit.  Skin: Skin is warm and dry. No rash noted. No erythema.  Psychiatric: His speech is normal and behavior is normal. Judgment normal. His mood appears anxious. Cognition and memory are normal. He expresses suicidal ideation.      Assessment/Plan This is a 50 year old male admitted for Lyrica overdose. 1. Overdose: Observe for respiratory depression or cardiac arrhythmia. Place patient on telemetry and suicide precautions. 2. Depression: Bipolar type; and tinea trazodone, Elavil, Latuda and Lexapro. 3. Chronic back pain: Continue Flexeril, ibuprofen and Percocet per home regimen. 4. DVT prophylaxis: Lovenox 5. GI prophylaxis: None The patient is a full code. Time spent on admission was inpatient care approximately 45 minutes  Harrie Foreman, MD 06/19/2015, 5:31 AM

## 2015-06-19 NOTE — Progress Notes (Signed)
Spoke with representative from Poison control, provided an update.

## 2015-06-20 ENCOUNTER — Other Ambulatory Visit: Payer: Self-pay | Admitting: Psychiatry

## 2015-06-20 ENCOUNTER — Inpatient Hospital Stay
Admit: 2015-06-20 | Discharge: 2015-06-24 | DRG: 881 | Disposition: A | Discharge status: Home / Self Care | Payer: Medicaid Other | Attending: Psychiatry | Admitting: Psychiatry

## 2015-06-20 ENCOUNTER — Inpatient Hospital Stay: Admit: 2015-06-20 | Payer: Self-pay | Admitting: Psychiatry

## 2015-06-20 DIAGNOSIS — T50901A Poisoning by unspecified drugs, medicaments and biological substances, accidental (unintentional), initial encounter: Secondary | ICD-10-CM | POA: Diagnosis present

## 2015-06-20 DIAGNOSIS — F329 Major depressive disorder, single episode, unspecified: Principal | ICD-10-CM | POA: Diagnosis present

## 2015-06-20 DIAGNOSIS — I10 Essential (primary) hypertension: Secondary | ICD-10-CM | POA: Diagnosis present

## 2015-06-20 DIAGNOSIS — G8929 Other chronic pain: Secondary | ICD-10-CM | POA: Diagnosis present

## 2015-06-20 DIAGNOSIS — F1123 Opioid dependence with withdrawal: Secondary | ICD-10-CM | POA: Diagnosis present

## 2015-06-20 DIAGNOSIS — Z79899 Other long term (current) drug therapy: Secondary | ICD-10-CM

## 2015-06-20 DIAGNOSIS — Z9114 Patient's other noncompliance with medication regimen: Secondary | ICD-10-CM

## 2015-06-20 DIAGNOSIS — F1420 Cocaine dependence, uncomplicated: Secondary | ICD-10-CM | POA: Diagnosis present

## 2015-06-20 DIAGNOSIS — F17200 Nicotine dependence, unspecified, uncomplicated: Secondary | ICD-10-CM

## 2015-06-20 DIAGNOSIS — G4700 Insomnia, unspecified: Secondary | ICD-10-CM | POA: Diagnosis present

## 2015-06-20 DIAGNOSIS — Z885 Allergy status to narcotic agent status: Secondary | ICD-10-CM

## 2015-06-20 DIAGNOSIS — Z79891 Long term (current) use of opiate analgesic: Secondary | ICD-10-CM | POA: Diagnosis not present

## 2015-06-20 DIAGNOSIS — F1199 Opioid use, unspecified with unspecified opioid-induced disorder: Secondary | ICD-10-CM

## 2015-06-20 DIAGNOSIS — Z915 Personal history of self-harm: Secondary | ICD-10-CM

## 2015-06-20 DIAGNOSIS — F1120 Opioid dependence, uncomplicated: Secondary | ICD-10-CM | POA: Diagnosis present

## 2015-06-20 DIAGNOSIS — K219 Gastro-esophageal reflux disease without esophagitis: Secondary | ICD-10-CM | POA: Diagnosis present

## 2015-06-20 DIAGNOSIS — Z638 Other specified problems related to primary support group: Secondary | ICD-10-CM

## 2015-06-20 DIAGNOSIS — F331 Major depressive disorder, recurrent, moderate: Secondary | ICD-10-CM

## 2015-06-20 DIAGNOSIS — F1190 Opioid use, unspecified, uncomplicated: Secondary | ICD-10-CM

## 2015-06-20 DIAGNOSIS — F319 Bipolar disorder, unspecified: Secondary | ICD-10-CM | POA: Diagnosis not present

## 2015-06-20 HISTORY — DX: Opioid abuse, uncomplicated: F11.10

## 2015-06-20 MED ORDER — BUPRENORPHINE HCL 2 MG SL SUBL
4.0000 mg | SUBLINGUAL_TABLET | Freq: Once | SUBLINGUAL | Status: AC
  Administered 2015-06-21: 4 mg via SUBLINGUAL
  Filled 2015-06-20: qty 2

## 2015-06-20 MED ORDER — NICOTINE 21 MG/24HR TD PT24
21.0000 mg | MEDICATED_PATCH | Freq: Every day | TRANSDERMAL | Status: DC
  Administered 2015-06-21 – 2015-06-24 (×4): 21 mg via TRANSDERMAL
  Filled 2015-06-20 (×4): qty 1

## 2015-06-20 MED ORDER — BUPRENORPHINE HCL 8 MG SL SUBL: 8.0000 mg | SUBLINGUAL_TABLET | Freq: Once | SUBLINGUAL | Status: DC

## 2015-06-20 MED ORDER — MAGNESIUM HYDROXIDE 400 MG/5ML PO SUSP: 30.0000 mL | Freq: Every day | ORAL | Status: DC | PRN

## 2015-06-20 MED ORDER — BUPRENORPHINE HCL 2 MG SL SUBL
6.0000 mg | SUBLINGUAL_TABLET | Freq: Once | SUBLINGUAL | Status: AC
  Administered 2015-06-20: 6 mg via SUBLINGUAL
  Filled 2015-06-20: qty 3

## 2015-06-20 MED ORDER — LIDOCAINE 5 % EX PTCH
1.0000 | MEDICATED_PATCH | CUTANEOUS | Status: DC
  Administered 2015-06-20 – 2015-06-23 (×4): 1 via TRANSDERMAL
  Filled 2015-06-20 (×5): qty 1

## 2015-06-20 MED ORDER — ESCITALOPRAM OXALATE 10 MG PO TABS
20.0000 mg | ORAL_TABLET | Freq: Every day | ORAL | Status: DC
  Administered 2015-06-21 – 2015-06-24 (×4): 20 mg via ORAL
  Filled 2015-06-20 (×4): qty 2

## 2015-06-20 MED ORDER — OXYCODONE-ACETAMINOPHEN 5-325 MG PO TABS: 2.0000 | ORAL_TABLET | Freq: Four times a day (QID) | ORAL | Status: DC

## 2015-06-20 MED ORDER — TRAZODONE HCL 50 MG PO TABS
150.0000 mg | ORAL_TABLET | Freq: Every evening | ORAL | Status: DC | PRN
  Administered 2015-06-20 – 2015-06-21 (×2): 150 mg via ORAL
  Filled 2015-06-20 (×2): qty 1

## 2015-06-20 MED ORDER — NICOTINE 21 MG/24HR TD PT24: 21.0000 mg | MEDICATED_PATCH | Freq: Every day | TRANSDERMAL | Status: DC

## 2015-06-20 MED ORDER — ALUM & MAG HYDROXIDE-SIMETH 200-200-20 MG/5ML PO SUSP: 30.0000 mL | ORAL | Status: DC | PRN

## 2015-06-20 MED ORDER — ACETAMINOPHEN 500 MG PO TABS
1000.0000 mg | ORAL_TABLET | Freq: Four times a day (QID) | ORAL | Status: DC | PRN
  Administered 2015-06-21: 1000 mg via ORAL
  Filled 2015-06-20 (×2): qty 2

## 2015-06-20 MED ORDER — CYCLOBENZAPRINE HCL 10 MG PO TABS
10.0000 mg | ORAL_TABLET | Freq: Three times a day (TID) | ORAL | Status: DC
  Administered 2015-06-20 – 2015-06-24 (×13): 10 mg via ORAL
  Filled 2015-06-20 (×13): qty 1

## 2015-06-20 MED ORDER — LOPERAMIDE HCL 2 MG PO CAPS: 4.0000 mg | ORAL_CAPSULE | ORAL | Status: DC | PRN

## 2015-06-20 MED ORDER — LURASIDONE HCL 40 MG PO TABS
80.0000 mg | ORAL_TABLET | Freq: Every day | ORAL | Status: DC
  Administered 2015-06-20 – 2015-06-23 (×4): 80 mg via ORAL
  Filled 2015-06-20 (×4): qty 2

## 2015-06-20 MED ORDER — BUPRENORPHINE HCL 2 MG SL SUBL
2.0000 mg | SUBLINGUAL_TABLET | Freq: Every day | SUBLINGUAL | Status: AC
  Administered 2015-06-22: 2 mg via SUBLINGUAL
  Filled 2015-06-20: qty 1

## 2015-06-20 MED ORDER — HYDROXYZINE HCL 50 MG PO TABS
50.0000 mg | ORAL_TABLET | Freq: Four times a day (QID) | ORAL | Status: DC | PRN
  Administered 2015-06-20 (×2): 50 mg via ORAL
  Filled 2015-06-20 (×2): qty 1

## 2015-06-20 MED ORDER — NICOTINE 21 MG/24HR TD PT24
21.0000 mg | MEDICATED_PATCH | Freq: Every day | TRANSDERMAL | Status: DC
  Administered 2015-06-20: 06:00:00 21 mg via TRANSDERMAL
  Filled 2015-06-20: qty 1

## 2015-06-20 MED ORDER — ACETAMINOPHEN 325 MG PO TABS: 650.0000 mg | ORAL_TABLET | Freq: Four times a day (QID) | ORAL | Status: DC | PRN

## 2015-06-20 MED ORDER — ONDANSETRON 4 MG PO TBDP
4.0000 mg | ORAL_TABLET | Freq: Three times a day (TID) | ORAL | Status: DC
  Administered 2015-06-20 – 2015-06-22 (×7): 4 mg via ORAL
  Filled 2015-06-20 (×15): qty 1

## 2015-06-20 NOTE — Tx Team (Signed)
Initial Interdisciplinary Treatment Plan   PATIENT STRESSORS: Health problems Marital or family conflict Medication change or noncompliance   PATIENT STRENGTHS: Average or above average intelligence Communication skills   PROBLEM LIST: Problem List/Patient Goals Date to be addressed Date deferred Reason deferred Estimated date of resolution  Major Depression 06/20/2015     Suicidal ideations 06/20/2015                                                DISCHARGE CRITERIA:  Adequate post-discharge living arrangements Medical problems require only outpatient monitoring Verbal commitment to aftercare and medication compliance  PRELIMINARY DISCHARGE PLAN: Attend aftercare/continuing care group Return to previous living arrangement  PATIENT/FAMIILY INVOLVEMENT: This treatment plan has been presented to and reviewed with the patient, Daniel [name], and/or family member,   The patient and family have been given the opportunity to ask questions and make suggestions.  Margo CommonGigi George Aujanae [name] 06/20/2015, 4:22 PM

## 2015-06-20 NOTE — Progress Notes (Signed)
Patient pleasant and cooperative during admission assessment. Patient denies SI/HI at this time. Patient denies AVH. Patient informed of fall risk status, fall risk assessed "low" at this time. Patient oriented to unit/staff/room. Patient denies any questions/concerns at this time. Patient safe on unit with Q15 minute checks for safety. Skin assessment & body search done ,no contraband found. 

## 2015-06-20 NOTE — Discharge Summary (Signed)
[facility] - [place] [facility]   PATIENT NAME: Daniel [name]    MR#:  [id number]  DATE OF BIRTH:  Sep 19, 1965  DATE OF ADMISSION:  06/19/2015 ADMITTING PHYSICIAN: Arnaldo Natal, MD  DATE OF DISCHARGE: 06/20/2015 10:27 AM  PRIMARY CARE PHYSICIAN: [facility]    ADMISSION DIAGNOSIS:  Overdose, intentional self-harm, initial encounter (HCC) [T50.902A]  DISCHARGE DIAGNOSIS:  Principal Problem:   Major depressive disorder, recurrent severe without psychotic features (HCC) Active Problems:   Hypertension   Gastric reflux   Suicidal ideation   Overdose   SECONDARY DIAGNOSIS:   Past Medical History  Diagnosis Date  . Hypertension   . Bipolar 1 disorder (HCC)   . Chronic pain   . Anxiety     HOSPITAL COURSE:   1. Acute metabolic encephalopathy secondary to overdose. Mental status much improved on the day of discharge to psychiatric floor. 2. Overdose with Lyrica. I would hold off on prescribing Lyrica at this point. 3. Anxiety and bipolar disorder continue psychiatric medications as per psychiatry team 4. Chronic pain. Careful with pain medications 5. Tobacco abuse nicotine patch prescribed  DISCHARGE CONDITIONS:   Fair  CONSULTS OBTAINED:  Treatment Team:  Audery Amel, MD  DRUG ALLERGIES:   Allergies  Allergen Reactions  . Hydrocodone-Acetaminophen Hives    Vicodin/Norco    DISCHARGE MEDICATIONS:   Discharge Medication List as of 06/20/2015  9:26 AM    START taking these medications   Details  nicotine (NICODERM CQ - DOSED IN MG/24 HOURS) 21 mg/24hr patch Place 1 patch (21 mg total) onto the skin daily., Starting 06/20/2015, Until Discontinued, Print      CONTINUE these medications which have NOT CHANGED   Details  amitriptyline (ELAVIL) 150 MG tablet Take 1 tablet (150 mg total) by mouth at bedtime., Starting 03/19/2015, Until Discontinued, Print    cyclobenzaprine (FLEXERIL) 10 MG tablet Take 1 tablet  (10 mg total) by mouth 2 (two) times daily., Starting 04/22/2015, Until Discontinued, Print    escitalopram (LEXAPRO) 20 MG tablet Take 1 tablet (20 mg total) by mouth daily., Starting 04/22/2015, Until Discontinued, Print    lurasidone (LATUDA) 80 MG TABS tablet Take 1 tablet (80 mg total) by mouth daily with supper., Starting 03/19/2015, Until Discontinued, Print    oxyCODONE-acetaminophen (PERCOCET) 10-325 MG tablet Take 1 tablet by mouth every 8 (eight) hours as needed for pain., Starting 02/27/2015, Until Discontinued, Print    traZODone (DESYREL) 100 MG tablet Take 2 tablets (200 mg total) by mouth at bedtime., Starting 03/19/2015, Until Discontinued, Print      STOP taking these medications     ibuprofen (ADVIL,MOTRIN) 800 MG tablet      LYRICA 150 MG capsule      pregabalin (LYRICA) 150 MG capsule          DISCHARGE INSTRUCTIONS:   Follow-up with Dr. on psychiatry floor one day  If you experience worsening of your admission symptoms, develop shortness of breath, life threatening emergency, suicidal or homicidal thoughts you must seek medical attention immediately by calling 911 or calling your MD immediately  if symptoms less severe.  You Must read complete instructions/literature along with all the possible adverse reactions/side effects for all the Medicines you take and that have been prescribed to you. Take any new Medicines after you have completely understood and accept all the possible adverse reactions/side effects.   Please note  You were cared for by a hospitalist during your hospital stay. If you  have any questions about your discharge medications or the care you received while you were in the hospital after you are discharged, you can call the unit and asked to speak with the hospitalist on call if the hospitalist that took care of you is not available. Once you are discharged, your primary care physician will handle any further medical issues. Please note that NO  REFILLS for any discharge medications will be authorized once you are discharged, as it is imperative that you return to your primary care physician (or establish a relationship with a primary care physician if you do not have one) for your aftercare needs so that they can reassess your need for medications and monitor your lab values.    Today   CHIEF COMPLAINT:   Chief Complaint  Patient presents with  . Drug Overdose    HISTORY OF PRESENT ILLNESS:  Daniel [name]  is a 50 y.o. male presented with drug overdose.   VITAL SIGNS:  Blood pressure 125/72, pulse 80, temperature 97.5 F (36.4 C), temperature source Oral, resp. rate 20, height 6\' 1"  (1.854 m), weight 123.832 kg (273 lb), SpO2 99 %.    PHYSICAL EXAMINATION:  GENERAL:  50 y.o.-year-old patient lying in the bed with no acute distress.  EYES: Pupils equal, round, reactive to light and accommodation. No scleral icterus. Extraocular muscles intact.  HEENT: Head atraumatic, normocephalic. Oropharynx and nasopharynx clear.  NECK:  Supple, no jugular venous distention. No thyroid enlargement, no tenderness.  LUNGS: Normal breath sounds bilaterally, no wheezing, rales,rhonchi or crepitation. No use of accessory muscles of respiration.  CARDIOVASCULAR: S1, S2 normal. No murmurs, rubs, or gallops.  ABDOMEN: Soft, non-tender, non-distended. Bowel sounds present. No organomegaly or mass.  EXTREMITIES: No pedal edema, cyanosis, or clubbing.  NEUROLOGIC: Cranial nerves II through XII are intact. Muscle strength 5/5 in all extremities. Sensation intact. Gait not checked.  PSYCHIATRIC: The patient is alert and oriented x 3.  SKIN: No obvious rash, lesion, or ulcer.   DATA REVIEW:   CBC  Recent Labs Lab 06/19/15 0358  WBC 6.7  HGB 13.5  HCT 39.5*  PLT 264    Chemistries   Recent Labs Lab 06/19/15 0358  NA 139  K 3.5  CL 106  CO2 23  GLUCOSE 157*  BUN 11  CREATININE 0.93  CALCIUM 8.6*  AST 21  ALT 17  ALKPHOS 65   BILITOT 0.4    Management plans discussed with the patient, family and He is in agreement.  CODE STATUS:  Code Status History    Date Active Date Inactive Code Status Order ID Comments User Context   06/20/2015 11:17 AM  Full Code [id number]  Audery AmelJohn T Clapacs, MD Inpatient   06/19/2015  6:16 AM 06/20/2015 11:17 AM Full Code [id number]  Arnaldo NatalMichael S Diamond, MD ED   03/11/2015  5:55 PM 03/19/2015  4:07 PM Full Code [id number]  Shari ProwsJolanta B Pucilowska, MD Inpatient   02/24/2015  4:20 PM 02/28/2015  3:34 PM Full Code [id number]  Audery AmelJohn T Clapacs, MD Inpatient   05/08/2014  9:54 AM 05/09/2014  3:47 AM Full Code [id number]  Coletta MemosKyle Cabbell, MD HOV      TOTAL TIME TAKING CARE OF THIS PATIENT: 35 minutes.    Alford HighlandWIETING, Jadan Rouillard M.D on 06/20/2015 at 3:19 PM  Between 7am to 6pm - Pager - [phone]  After 6pm go to [email or web address] - Social research officer, governmentpassword EPAS [email or web address]  [facility] Office  [phone]  CC: Primary care physician; [facility]

## 2015-06-20 NOTE — Discharge Instructions (Signed)
Drug Overdose °Drug overdose happens when you take too much of a drug. An overdose can occur with illegal drugs, prescription drugs, or over-the-counter (OTC) drugs. °The effects of drug overdose can be mild, dangerous, or even deadly. °CAUSES °Drug overdose may be caused by: °· Taking too much of a drug on purpose. °· Taking too much of a drug by accident. °· An error made by a health care provider who prescribes a drug. °· An error made by a pharmacist who fills the prescription order. °Drugs that commonly cause overdose include: °· Mental health drugs. °· Pain medicines. °· Illegal drugs. °· OTC cough and cold medicines. °· Heart medicines. °· Seizure medicines. °RISK FACTORS °Drug overdose is more likely in: °· Children. They may be attracted to colorful pills. Because of children's small size, even a small amount of a drug can be dangerous. °· Elderly people. They may be taking many different drugs. Elderly people may have difficulty reading labels or remembering when they last took their medicine. °The risk of drug overdose is also higher for someone who: °· Takes illegal drugs. °· Takes a drug and drinks alcohol. °· Has a mental health condition. °SYMPTOMS °Signs and symptoms of drug overdose depend on the drug and the amount that was taken. Common danger signs include: °· Behavior changes. °· Sleepiness. °· Slowed breathing. °· Nausea and vomiting. °· Seizures. °· Changes in eye pupil size (very large or very small). °If there are signs of very low blood pressure from a drug overdose (shock), emergency treatment is required. These signs include: °· Cold and clammy skin. °· Pale skin. °· Blue lips. °· Very slow breathing. °· Extreme sleepiness. °· Loss of consciousness. °DIAGNOSIS °Drug overdose may be diagnosed based on your symptoms. It is important that you tell your health care provider: °· All of the drugs that you have taken. °· When you took the drugs. °· Whether you were drinking alcohol. °Your health  care provider will do a physical exam. This exam may include: °· Checking and monitoring your heart rate and rhythm, your temperature, and your blood pressure (vital signs). °· Checking your breathing and oxygen level. °You may also have tests, including:  °· Urine tests to check for drugs in your system. °· Blood tests to check for: °¨ Drugs in your system. °¨ Signs of an imbalance of your blood minerals (electrolytes). °¨ Liver damage. °¨ Kidney damage. °TREATMENT °Supporting your vital signs and your breathing is the first step in treating a drug overdose. Treatment may also include: °· Receiving fluids and electrolytes through an IV tube. °· Having a breathing tube (endotracheal tube) inserted in your airway to help you breathe. °· Having a tube passed through your nose and into your stomach (nasogastric tube) to wash out your stomach. °· Medicines. You may get medicines to: °¨ Make you vomit. °¨ Absorb any medicine that is left in your digestive system (activated charcoal). °¨ Block or reverse the effect of the drug that caused the overdose. °· Having your blood filtered through an artificial kidney machine (hemodialysis). You may need this if your overdose is severe or if you have kidney failure. °· Having ongoing counseling and mental health support if you intentionally overdosed or used an illegal drug. °HOME CARE INSTRUCTIONS °· Take medicines only as directed by your health care provider. Always ask your health care provider to discuss the possible side effects of any new drug that you start taking. °· Keep a list of all of the drugs   that you take, including over-the-counter medicines. Bring this list with you to all of your medical visits. °· Read the drug inserts that come with your medicines. °· Do not use illegal drugs. °· Do not drink alcohol when taking drugs. °· Store all medicines in safety containers that are out of the reach of children. °· Keep the phone number of your local poison control  center near your phone or on your cell phone. °· Get help if you are struggling with alcohol or drug use. °· Get help if you are struggling with depression or another mental health problem. °· Keep all follow-up visits as directed by your health care provider. This is important. °SEEK MEDICAL CARE IF: °· Your symptoms return. °· You develop any new signs or symptoms when you are taking medicines. °SEEK IMMEDIATE MEDICAL CARE IF: °· You think that you or someone else may have taken too much of a drug. The hotline of the National Poison Control Center is (800) 222-1222. °· You or someone else is having symptoms of a drug overdose. °· You have serious thoughts about hurting yourself or others. °· You have chest pain. °· You have difficulty breathing. °· You have a loss of consciousness. °Drug overdose is an emergency. Do not wait to see if the symptoms will go away. Get medical help right away. Call your local emergency services (911 in the [place] Do not drive yourself to the hospital. °  °This information is not intended to replace advice given to you by your health care provider. Make sure you discuss any questions you have with your health care provider. °  °Document Released: 07/02/2014 Document Reviewed: 07/02/2014 °Elsevier Interactive Patient Education ©2016 [employer]vier Inc[employer] ° °

## 2015-06-20 NOTE — [facility] Group Notes (Signed)
[facility] LCSW Group Therapy  06/20/2015 2:21 PM  Type of Therapy:  Group Therapy  Participation Level:  Did Not Attend  Modes of Intervention:  Discussion, Education, Socialization and Support  Summary of Progress/Problems: Feelings around Relapse. Group members discussed the meaning of relapse and shared personal stories of relapse, how it affected them and others, and how they perceived themselves during this time. Group members were encouraged to identify triggers, warning signs and coping skills used when facing the possibility of relapse. Social supports were discussed and explored in detail.   Sofia Vanmeter L Karysa Heft  MSW, LCSWA   06/20/2015, 2:21 PM   

## 2015-06-20 NOTE — Clinical Social Work Note (Signed)
CSW consulted for Transportation Needs. Pt is ready for discharge today and will admit to BMU. CSW is signing off as no further needs identified.   Dede QuerySarah Keva Darty, MSW, LCSW  Clinical Social Worker  [phone]

## 2015-06-20 NOTE — Care Management (Signed)
Admitted to [facility] with the diagnosis of Major depressive disorder. Lives alone. Ex-Wife is Linna Capricengela McAdams [phone]). Establ[facility] patient at [facility], but hasn,t been there in a long time. [facility]. Sees Sarah (can't pronounce last name) at [place]. States he is on disability since a car fell on him in 1988. Did own two business, [employer] and Research officer, trade [employer]. Takes care of all activities of daily living himself. Can drive, but doesn't have a car. Uses [employer] for transportation. No falls. No transportation home. Psychiatric evaluation completed per Dr. Gerre Pebbleslapac. Transfer to Behavioral Medicine when medical stable.  Gwenette GreetBrenda S Bronsyn Shappell RN MSN [facility] Care Management [phone]

## 2015-06-20 NOTE — Progress Notes (Signed)
[facility] [place] [facility]. No unanswered questions. Patient stable for [facility].

## 2015-06-20 NOTE — [facility] Group Notes (Signed)
[facility] Group Notes:  (Nursing/MHT/Case Management/Adjunct)  Date:  06/20/2015  Time:  5:04 PM  Type of Therapy:  Psychoeducational Skills  Participation Level:  Did Not Attend   Lynelle SmokeCara Travis [facility] 06/20/2015, 5:04 PM
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
## 2015-06-20 NOTE — Care Management (Signed)
RNCM consult received for Medication assistance. I spoke with [name] CSW [phone]. No RNCM needs as CSW is taking care of him per [name]. RNCM signing off.

## 2015-06-20 NOTE — Progress Notes (Signed)
Recreation Therapy Notes  Date: 04.21.17 Time: 1:00 pm Location: Craft Room  Group Topic: Self-expression, Coping skills  Goal Area(s) Addresses:  Patient will effectively use art as a means of self-expression. Patient will recognize positive benefit of self-expression. Patient will be able to identify one emotion experienced during group. Patient will identify use of art as a coping skill.  Behavioral Response: Attentive, Interactive  Intervention: Two Faces of Me  Activity: Patients were given a blank face worksheet and instructed to draw a line down the middle. On one side of the face, patients were instructed to draw or write how they felt when they were admitted to the hospital. On the other side of the face, patients were instructed to draw or write how they want to feel when they are d/c.  Education: LRT educated patients on different forms of self-expression.  Education Outcome: Acknowledges education/In group clarification offered   Clinical Observations/Feedback: Patient completed activity by writing and drawing how he felt when he was admitted and how he wanted to feel when he is d/c. Patient contributed to group discussion by stating how his faces were different, what he has learned to help with positive change, and what emotions he felt while he was doing the activity.  [name],Maahir Horst M, LRT/CTRS 06/20/2015 2:11 PM

## 2015-06-21 MED ORDER — IBUPROFEN 800 MG PO TABS
800.0000 mg | ORAL_TABLET | Freq: Four times a day (QID) | ORAL | Status: DC | PRN
  Administered 2015-06-21 – 2015-06-22 (×2): 800 mg via ORAL
  Filled 2015-06-21 (×2): qty 1

## 2015-06-21 NOTE — Plan of Care (Signed)
Problem: Alteration in mood Goal: LTG-Patient reports reduction in suicidal thoughts (Patient reports reduction in suicidal thoughts and is able to verbalize a safety plan for whenever patient is feeling suicidal)  Outcome: Progressing Pt denies SI and was able to verbally contract for safety

## 2015-06-21 NOTE — Progress Notes (Signed)
Pt has been pleasant and cooperative but restless and exhibiting med seeking behaviors. Pt c/o having pain not relieved by current medication. Pt has been active on the unit. Pt denies SI , HI and A/V hallucinations . Pt appears to be detoxing fairly well.. Will continue to monitor and redirect as needed.
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
## 2015-06-21 NOTE — Progress Notes (Signed)
D: Pt woke up at night complaining of headache. Pt walking to day room multiple times at night to get water. Pt lingering in day room at times, and needs redirection to get backPt also appeared mildly confused asking about "why's it so quite, what time is it" and regularly forgetting what hall his room was on. Pt was oriented to person, place, date, and situation. A: Gave tylenol prn for headache. Attempted to educate pt on improving sleep. Reoriented pt to environment. R: Pt walking to day room less, but still having difficulty sleeping.

## 2015-06-21 NOTE — Progress Notes (Addendum)
[facility] MD Progress Note  06/21/2015 11:25 AM Daniel [name]  MRN:  [id number] Subjective: Patient with a history of  depression, chronic pain and opiate dependence was admitted to [place] after an overdose on Lyrica  Patient still undergoing significant withdrawal from opiates. He was started yesterday on Suboxone taper. He received 6 mg of Suboxone yesterday, and today he is scheduled to receive 4 mg and tomorrow he will receive 2 mg. The patient is uncomfortable. Continues to have diaphoresis and appears restless. The patient is requesting today to be prescribed ibuprofen. His main focus and concerns and is the withdrawal. He continues to voice depression as he will be evicted from his home soon and does not have enough finances to afford his medications and doctors appointments. Denies suicidality, homicidality or having auditory or visual hallucinations. He feels weak, tired but this is related to withdrawals.   Per nursing: Pt woke up at night complaining of headache. Pt walking to day room multiple times at night to get water. Pt lingering in day room at times, and needs redirection to get backPt also appeared mildly confused asking about "why's it so quite, what time is it" and regularly forgetting what hall his room was on. Pt was oriented to person, place, date, and situation.  Principal Problem: Major depression (HCC) Diagnosis:   Patient Active Problem List   Diagnosis Date Noted  . Major depression (HCC) [F32.9] 06/20/2015  . Cocaine use disorder, moderate, dependence (HCC) [F14.20] 06/20/2015  . Opioid use with withdrawal (HCC) [F11.93] 06/20/2015  . Tobacco use disorder [F17.200] 06/20/2015  . Chronic pain [G89.29] 02/24/2015  . Opioid use disorder, severe, dependence (HCC) [F11.20] 02/24/2015  . Hypertension [I10] 08/02/2014  . Gastric reflux [K21.9] 08/02/2014   Total Time spent with patient: 30 minutes  Past Psychiatric History:   Past Medical History:  Past Medical History   Diagnosis Date  . Hypertension   . Chronic pain   . Anxiety   . Opioid abuse     Past Surgical History  Procedure Laterality Date  . Knee surgery Left    Family History:  Family History  Problem Relation Age of Onset  .      Family Psychiatric  History:  Social History:  History  Alcohol Use No     History  Drug Use No    Social History   Social History  . Marital Status: Divorced    Spouse Name: N/A  . Number of Children: N/A  . Years of Education: N/A   Social History Main Topics  . Smoking status: Current Every Day Smoker  . Smokeless tobacco: Never Used  . Alcohol Use: No  . Drug Use: No  . Sexual Activity: Not Asked   Other Topics Concern  . None   Social History Narrative      Current Medications: Current Facility-Administered Medications  Medication Dose Route Frequency Provider Last Rate Last Dose  . acetaminophen (TYLENOL) tablet 1,000 mg  1,000 mg Oral Q6H PRN Jimmy Footman, MD   1,000 mg at 06/21/15 0215  . alum & mag hydroxide-simeth (MAALOX/MYLANTA) 200-200-20 MG/5ML suspension 30 mL  30 mL Oral Q4H PRN Audery Amel, MD      . Melene Muller ON 06/22/2015] buprenorphine (SUBUTEX) SL tablet 2 mg  2 mg Sublingual Daily Jimmy Footman, MD      . cyclobenzaprine (FLEXERIL) tablet 10 mg  10 mg Oral TID Jimmy Footman, MD   10 mg at 06/21/15 4098  . escitalopram (LEXAPRO) tablet 20  mg  20 mg Oral Daily Audery AmelJohn T Clapacs, MD   20 mg at 06/21/15 0923  . hydrOXYzine (ATARAX/VISTARIL) tablet 50 mg  50 mg Oral Q6H PRN Jimmy FootmanAndrea Hernandez-Gonzalez, MD   50 mg at 06/20/15 2159  . ibuprofen (ADVIL,MOTRIN) tablet 800 mg  800 mg Oral Q6H PRN Jimmy FootmanAndrea Hernandez-Gonzalez, MD      . lidocaine (LIDODERM) 5 % 1 patch  1 patch Transdermal Q24H Jimmy FootmanAndrea Hernandez-Gonzalez, MD   1 patch at 06/20/15 1531  . loperamide (IMODIUM) capsule 4 mg  4 mg Oral PRN Jimmy FootmanAndrea Hernandez-Gonzalez, MD      . lurasidone (LATUDA) tablet 80 mg  80 mg Oral Q supper Audery AmelJohn T  Clapacs, MD   80 mg at 06/20/15 1650  . magnesium hydroxide (MILK OF MAGNESIA) suspension 30 mL  30 mL Oral Daily PRN Audery AmelJohn T Clapacs, MD      . nicotine (NICODERM CQ - dosed in mg/24 hours) patch 21 mg  21 mg Transdermal Daily Audery AmelJohn T Clapacs, MD   21 mg at 06/21/15 0921  . ondansetron (ZOFRAN-ODT) disintegrating tablet 4 mg  4 mg Oral TID Jimmy FootmanAndrea Hernandez-Gonzalez, MD   4 mg at 06/21/15 0941  . traZODone (DESYREL) tablet 150 mg  150 mg Oral QHS PRN Jimmy FootmanAndrea Hernandez-Gonzalez, MD   150 mg at 06/20/15 2159    Lab Results:  Results for orders placed or performed during the hospital encounter of 06/19/15 (from the past 48 hour(s))  Urine Drug Screen, Qualitative ([facility] only)     Status: Abnormal   Collection Time: 06/19/15 10:36 PM  Result Value Ref Range   Tricyclic, Ur Screen POSITIVE (A) NONE DETECTED   Amphetamines, Ur Screen NONE DETECTED NONE DETECTED   MDMA (Ecstasy)Ur Screen NONE DETECTED NONE DETECTED   Cocaine Metabolite,Ur [place] POSITIVE (A) NONE DETECTED   Opiate, Ur Screen NONE DETECTED NONE DETECTED   Phencyclidine (PCP) Ur S NONE DETECTED NONE DETECTED   Cannabinoid 50 Ng, Ur [place] NONE DETECTED NONE DETECTED   Barbiturates, Ur Screen NONE DETECTED NONE DETECTED   Benzodiazepine, Ur Scrn NONE DETECTED NONE DETECTED   Methadone Scn, Ur NONE DETECTED NONE DETECTED    Comment: (NOTE) 100  Tricyclics, urine               Cutoff 1000 ng/mL 200  Amphetamines, urine             Cutoff 1000 ng/mL 300  MDMA (Ecstasy), urine           Cutoff 500 ng/mL 400  Cocaine Metabolite, urine       Cutoff 300 ng/mL 500  Opiate, urine                   Cutoff 300 ng/mL 600  Phencyclidine (PCP), urine      Cutoff 25 ng/mL 700  Cannabinoid, urine              Cutoff 50 ng/mL 800  Barbiturates, urine             Cutoff 200 ng/mL 900  Benzodiazepine, urine           Cutoff 200 ng/mL 1000 Methadone, urine                Cutoff 300 ng/mL 1100 1200 The urine drug screen provides only a preliminary,  unconfirmed 1300 analytical test result and should not be used for non-medical 1400 purposes. Clinical consideration and professional judgment should 1500 be applied to any positive drug screen result due  to possible 1600 interfering substances. A more specific alternate chemical method 1700 must be used in order to obtain a confirmed analytical result.  1800 Gas chromato graphy / mass spectrometry (GC/MS) is the preferred 1900 confirmatory method.     Blood Alcohol level:  Lab Results  Component Value Date   ETH <5 06/19/2015   ETH <5 04/22/2015    Physical Findings: AIMS:  , ,  ,  ,    CIWA:    COWS:  COWS Total Score: 10  Musculoskeletal: Strength & Muscle Tone: within normal limits Gait & Station: normal Patient leans: N/A  Psychiatric Specialty Exam: Review of Systems  Constitutional: Positive for diaphoresis.  HENT: Negative.   Eyes: Negative.   Respiratory: Negative.   Cardiovascular: Negative.   Gastrointestinal: Negative.   Genitourinary: Negative.   Musculoskeletal: Positive for back pain.  Skin: Negative.   Neurological: Positive for weakness.  Endo/Heme/Allergies: Negative.   Psychiatric/Behavioral: Positive for depression.    Blood pressure 109/76, pulse 97, temperature 98.8 F (37.1 C), temperature source Oral, resp. rate 18, height 6' (1.829 m), weight 127.007 kg (280 lb), SpO2 98 %.Body mass index is 37.97 kg/(m^2).  General Appearance: Fairly Groomed  Patent attorney::  Good  Speech:  Clear and Coherent  Volume:  Normal  Mood:  Anxious  Affect:  Congruent  Thought Process:  Linear  Orientation:  Full (Time, Place, and Person)  Thought Content:  Hallucinations: None  Suicidal Thoughts:  No  Homicidal Thoughts:  No  Memory:  Immediate;   Good Recent;   Good Remote;   Good  Judgement:  Poor  Insight:  Shallow  Psychomotor Activity:  Increased  Concentration:  Good  Recall:  Good  Fund of Knowledge:Good  Language: Good  Akathisia:  No   Handed:    AIMS (if indicated):     Assets:  Communication Skills  ADL's:  Intact  Cognition: WNL  Sleep:  Number of Hours: 7.25   Treatment Plan Summary: Major depressive disorder the patient will be restarted on Lexapro 20 mg a day and Latuda 80 mg a day. Patient says he has not been compliant with his medications since discharge from our psychiatric facility back in early January.  Opiate abuse and multiple red flags in the chart multiple visits to the emergency department requesting refills, stating that his prescriptions were stolen. He is current toxicology screen is negative for opiates and positive for cocaine. The patient is irate because I contacted his pain management in order to coordinate care. He is stated " this an invasion of my privacy"  Opiate withdrawal currently clearly showing signs of withdrawal the patient is diaphoretic, agitated, complains of pain, he is displaying psychomotor agitation. His states that he was provided with Subutex during the last hospitalization he was here he is demanding to be restarted on Subutex for opiate withdrawal. Patient has been started on a Suboxone taper for 3 days of 6 mg then 4 mg and then 2 mg. In addition continue Flexeril, Lidoderm patches, Zofran and Imodium when necessary.  Tobacco use disorder: continue nicotine patch 21 mg a day  For insomnia: continue r trazodone 150 mg by mouth daily at bedtime  Precautions every 15 minute checks  Diet regular  Discharge disposition patient is requesting assistance with housing. This point in time the most week and at least offering a halfway house or a boarding house  Discharge follow-up patient will be scheduled to follow-up with psychiatry in one of our community behavioral health  clinics.  Collateral information will be obtained from her pain management nurse practitioner.  No control substances will be provided upon discharge.  Labs hemoglobin A1c is within normal limits, lipid  panel is within the normal limits, prolactin has been ordered and is currently pending.  No changes will be made to his regimen today. The patient is undergoing severe opiate withdrawals. He has been started on a Suboxone taper for 3 days. Patient feels that Lexapro and Latuda are helpful and the dose will not be changed.    Jimmy Footman, MD 06/21/2015, 11:25 AM

## 2015-06-22 MED ORDER — IBUPROFEN 800 MG PO TABS
800.0000 mg | ORAL_TABLET | Freq: Three times a day (TID) | ORAL | Status: DC
  Administered 2015-06-22 – 2015-06-24 (×7): 800 mg via ORAL
  Filled 2015-06-22 (×7): qty 1

## 2015-06-22 MED ORDER — LOPERAMIDE HCL 2 MG PO CAPS
2.0000 mg | ORAL_CAPSULE | Freq: Three times a day (TID) | ORAL | Status: DC
  Filled 2015-06-22: qty 1

## 2015-06-22 MED ORDER — TRAZODONE HCL 50 MG PO TABS
150.0000 mg | ORAL_TABLET | Freq: Every day | ORAL | Status: DC
  Administered 2015-06-22 – 2015-06-23 (×2): 150 mg via ORAL
  Filled 2015-06-22 (×2): qty 1

## 2015-06-22 MED ORDER — ONDANSETRON 4 MG PO TBDP
4.0000 mg | ORAL_TABLET | Freq: Three times a day (TID) | ORAL | Status: DC
Start: 2015-06-22 — End: 2015-06-24
  Administered 2015-06-22 – 2015-06-24 (×6): 4 mg via ORAL
  Filled 2015-06-22 (×11): qty 1

## 2015-06-22 MED ORDER — HYDROXYZINE HCL 50 MG PO TABS
50.0000 mg | ORAL_TABLET | Freq: Three times a day (TID) | ORAL | Status: DC
  Administered 2015-06-22 – 2015-06-24 (×7): 50 mg via ORAL
  Filled 2015-06-22 (×7): qty 1

## 2015-06-22 NOTE — Progress Notes (Signed)
Pt has been pleasant and cooperative but restless and exhibiting med seeking behaviors. Pt continues to c/o having pain not relieved by current medication. Pt has been active on the unit but still not attending any  unit activities. Pt denies SI , HI and A/V hallucinations . Pt appears to be detoxing fairly well.. Will continue to monitor and redirect as needed.

## 2015-06-22 NOTE — Plan of Care (Signed)
Problem: Ineffective individual coping Goal: STG: Patient will remain free from self harm Outcome: Progressing Pt remains free from self harm this shift.     

## 2015-06-22 NOTE — Progress Notes (Signed)
[facility]  06/22/2015 10:06 AM Daniel [name]  MRN:  [id number] Subjective: Patient with a history of  depression, chronic pain and opiate dependence was admitted to us after an overdose on Lyrica  Patient still undergoing significant withdrawal from opiates. He was started Friday on Suboxone taper. He received 6 mg of Suboxone on Friday, 4 mg Saturday and will receive 2 mg today.  The patient is uncomfortable. Continues to have diaphoresis and appears restless. He also complains of insomnia, chills, sweats, nausea. He continues to voice depression as he will be evicted from his home soon and does not have enough finances to afford his medications and doctors appointments. Denies suicidality, homicidality or having auditory or visual hallucinations. He feels weak, tired but this is related to withdrawals.   Per nursing: Pt has been pleasant and cooperative but restless and exhibiting med seeking behaviors. Pt c/o having pain not relieved by current medication. Pt has been active on the unit. Pt denies SI , HI and A/V hallucinations . Pt appears to be detoxing fairly well.. Will continue to monitor and redirect as needed.   Principal Problem: Major depression (HCC) Diagnosis:   Patient Active Problem List   Diagnosis Date Noted  . Major depression (HCC) [F32.9] 06/20/2015  . Cocaine use disorder, moderate, dependence (HCC) [F14.20] 06/20/2015  . Opioid use with withdrawal (HCC) [F11.93] 06/20/2015  . Tobacco use disorder [F17.200] 06/20/2015  . Chronic pain [G89.29] 02/24/2015  . Opioid use disorder, severe, dependence (HCC) [F11.20] 02/24/2015  . Hypertension [I10] 08/02/2014  . Gastric reflux [K21.9] 08/02/2014   Total Time spent with patient: 30 minutes  Past Psychiatric History:   Past Medical History:  Past Medical History  Diagnosis Date  . Hypertension   . Chronic pain   . Anxiety   . Opioid abuse     Past Surgical History  Procedure Laterality Date  . Knee  surgery Left    Family History:  Family History  Problem Relation Age of Onset  .      Family Psychiatric  History:  Social History:  History  Alcohol Use No     History  Drug Use No    Social History   Social History  . Marital Status: Divorced    Spouse Name: N/A  . Number of Children: N/A  . Years of Education: N/A   Social History Main Topics  . Smoking status: Current Every Day Smoker  . Smokeless tobacco: Never Used  . Alcohol Use: No  . Drug Use: No  . Sexual Activity: Not Asked   Other Topics Concern  . None   Social History Narrative      Current Medications: Current Facility-Administered Medications  Medication Dose Route Frequency Provider Last Rate Last Dose  . acetaminophen (TYLENOL) tablet 1,000 mg  1,000 mg Oral Q6H PRN Jimmy FootmanAndrea Hernandez-Gonzalez, MD   1,000 mg at 06/21/15 0215  . alum & mag hydroxide-simeth (MAALOX/MYLANTA) 200-200-20 MG/5ML suspension 30 mL  30 mL Oral Q4H PRN Audery AmelJohn T Clapacs, MD      . cyclobenzaprine (FLEXERIL) tablet 10 mg  10 mg Oral TID Jimmy FootmanAndrea Hernandez-Gonzalez, MD   10 mg at 06/22/15 [id number]  . escitalopram (LEXAPRO) tablet 20 mg  20 mg Oral Daily Audery AmelJohn T Clapacs, MD   20 mg at 06/22/15 [id number]  . hydrOXYzine (ATARAX/VISTARIL) tablet 50 mg  50 mg Oral TID AC Jimmy FootmanAndrea Hernandez-Gonzalez, MD      . ibuprofen (ADVIL,MOTRIN) tablet 800 mg  800 mg Oral TID  AC Jimmy Footman, MD      . lidocaine (LIDODERM) 5 % 1 patch  1 patch Transdermal Q24H Jimmy Footman, MD   1 patch at 06/21/15 1602  . loperamide (IMODIUM) capsule 2 mg  2 mg Oral TID AC Jimmy Footman, MD      . lurasidone (LATUDA) tablet 80 mg  80 mg Oral Q supper Audery Amel, MD   80 mg at 06/21/15 1608  . magnesium hydroxide (MILK OF MAGNESIA) suspension 30 mL  30 mL Oral Daily PRN Audery Amel, MD      . nicotine (NICODERM CQ - dosed in mg/24 hours) patch 21 mg  21 mg Transdermal Daily Audery Amel, MD   21 mg at 06/22/15 0823  . ondansetron  (ZOFRAN-ODT) disintegrating tablet 4 mg  4 mg Oral TID AC Jimmy Footman, MD      . traZODone (DESYREL) tablet 150 mg  150 mg Oral QHS Jimmy Footman, MD        Lab Results:  No results found for this or any previous visit (from the past 48 hour(s)).  Blood Alcohol level:  Lab Results  Component Value Date   ETH <5 06/19/2015   ETH <5 04/22/2015    Physical Findings: AIMS:  , ,  ,  ,    CIWA:    COWS:  COWS Total Score: 10  Musculoskeletal: Strength & Muscle Tone: within normal limits Gait & Station: normal Patient leans: N/A  Psychiatric Specialty Exam: Review of Systems  Constitutional: Positive for chills and diaphoresis.  HENT: Negative.   Eyes: Negative.   Respiratory: Negative.   Cardiovascular: Negative.   Gastrointestinal: Positive for nausea and abdominal pain.  Genitourinary: Negative.   Musculoskeletal: Positive for back pain.  Skin: Negative.   Neurological: Positive for weakness.  Endo/Heme/Allergies: Negative.   Psychiatric/Behavioral: Positive for depression. The patient is nervous/anxious and has insomnia.     Blood pressure 121/71, pulse 85, temperature 98.4 F (36.9 C), temperature source Oral, resp. rate 18, height 6' (1.829 m), weight 127.007 kg (280 lb), SpO2 98 %.Body mass index is 37.97 kg/(m^2).  General Appearance: Fairly Groomed  Patent attorney::  Good  Speech:  Clear and Coherent  Volume:  Normal  Mood:  Anxious  Affect:  Congruent  Thought Process:  Linear  Orientation:  Full (Time, Place, and Person)  Thought Content:  Hallucinations: None  Suicidal Thoughts:  No  Homicidal Thoughts:  No  Memory:  Immediate;   Good Recent;   Good Remote;   Good  Judgement:  Poor  Insight:  Shallow  Psychomotor Activity:  Increased  Concentration:  Good  Recall:  Good  Fund of Knowledge:Good  Language: Good  Akathisia:  No  Handed:    AIMS (if indicated):     Assets:  Communication Skills  ADL's:  Intact  Cognition: WNL   Sleep:  Number of Hours: 2.3   Treatment Plan Summary: Major depressive disorder the patient will be restarted on Lexapro 20 mg a day and Latuda 80 mg a day. Patient says he has not been compliant with his medications since discharge from our psychiatric facility back in early January.  Opiate abuse and multiple red flags in the chart multiple visits to the emergency department requesting refills, stating that his prescriptions were stolen. He is current toxicology screen is negative for opiates and positive for cocaine. The patient is irate because I contacted his pain management in order to coordinate care. He is stated " this an  invasion of my privacy"  Opiate withdrawal currently clearly showing signs of withdrawal the patient is diaphoretic, agitated, complains of pain, he is displaying psychomotor agitation. His states that he was provided with Subutex during the last hospitalization he was here he is demanding to be restarted on Subutex for opiate withdrawal. Patient has been started on a Suboxone taper for 3 days of 6 mg then 4 mg and then 2 mg. today I will make Zofran, Vistaril, ibuprofen and trazodone all scheduled as patient is complaining of nausea, pains and insomnia.  Tobacco use disorder: continue nicotine patch 21 mg a day  For insomnia: continue r trazodone 150 mg by mouth daily at bedtime  Precautions every 15 minute checks  Diet regular  Discharge disposition patient is requesting assistance with housing. This point in time the most week and at least offering a halfway house or a boarding house  Discharge follow-up patient will be scheduled to follow-up with psychiatry in one of our community behavioral health clinics.  Collateral information will be obtained from her pain management nurse practitioner.  No control substances will be provided upon discharge.  Labs hemoglobin A1c is within normal limits, lipid panel is within the normal limits, prolactin has been  ordered and is currently pending.  No changes will be made to his regimen today. T patient will complete this Suboxone taper today. He is still reporting significant symptoms of withdrawal. He will be receiving Vistaril, Imodium, Zofran, and ibuprofen scheduled.     Jimmy Footman, MD 06/22/2015, 10:06 AM

## 2015-06-22 NOTE — [facility] Group Notes (Signed)
[facility] LCSW Group Therapy  06/22/2015 7:45 AM  Type of Therapy:  Group Therapy  Participation Level:  Active  Participation Quality:  Appropriate  Affect:  Blunted  Cognitive:  Alert  Insight:  Engaged  Engagement in Therapy:  Developing/Improving  Modes of Intervention:  Discussion, Exploration and Problem-solving  Summary of Progress/Problems:LCSW conducted group therapy outdoors and reviewed group rules. Each group member was asked to introduce themselves and discuss an obstacle they face when it comes to their illness and how they overcome those obstacles. Group members supported each other during this process. LCSW provided therapeutic and motivational questioning and humour. This patient reported her obstacle as being overly stressed being stuck not able to move forward to get out of depression. LCSW provided suggestions increase activities, stay on a schedule,talk to family,take medications and keep appointments  Daniel MoloneyBandi, Daniel [name] M 06/22/2015, 7:45 AM

## 2015-06-22 NOTE — Progress Notes (Signed)
D: Pt denied SI/AVH. Affect anxious. Restless, Walked the halls a few times during the night. Medication compliant. A: Encouragement and support provided. Medications given as prescribed. Q15 minute checks maintained for safety. R: Remains safe on unit. Voices no additional concerns.
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
## 2015-06-22 NOTE — BHH Group Notes (Signed)
BHH LCSW Group Therapy  06/22/2015 5:04 PM  Type of Therapy:  Group Therapy  Participation Level:  Did Not Attend  Participation Quality:    Affect:    Cognitive:    Insight:    Engagement in Therapy:    Modes of Intervention:    Summary of Progress/Problems:  Cheron SchaumannBandi, Fernie Grimm M 06/22/2015, 5:04 PM

## 2015-06-22 NOTE — Progress Notes (Signed)
Pt slept for 2.30 hrs. Was given PRN trazodone at 2208.

## 2015-06-23 MED ORDER — LURASIDONE HCL 80 MG PO TABS: 80.0000 mg | ORAL_TABLET | Freq: Every day | ORAL | Status: DC

## 2015-06-23 MED ORDER — ESCITALOPRAM OXALATE 20 MG PO TABS: 20.0000 mg | ORAL_TABLET | Freq: Every day | ORAL | Status: DC

## 2015-06-23 MED ORDER — TRAZODONE HCL 150 MG PO TABS: 150.0000 mg | ORAL_TABLET | Freq: Every day | ORAL | Status: DC

## 2015-06-23 NOTE — Progress Notes (Signed)
Recreation Therapy Notes  Date: 04.24.17 Time: 1:00 pm Location: Craft Room  Group Topic: Self-expression  Goal Area(s) Addresses:  Patient will identify one color per emotion listed on wheel. Patient will verbalize benefit of using art as a means of self-expression. Patient will verbalize one emotion experienced during group. Patient will be educated on other forms of self-expression.  Behavioral Response: Did not attend  Intervention: Emotion Wheel  Activity: Patients were given an Emotion Wheel worksheet and instructed to pick a color for each emotion listed on the wheel.  Education: LRT educated patients on other forms of self-expression.  Education Outcome: Patient did not attend group.  Clinical Observations/Feedback: Patient did not attend group.  [name],Ahmir Bracken M, LRT/CTRS 06/23/2015 2:32 PM

## 2015-06-23 NOTE — Progress Notes (Signed)
Recreation Therapy Notes  At approximately 10:45 am, LRT attempted assessment. Patient refused stating he did not want to talk.  [name],Willistine Ferrall M, LRT/CTRS 06/23/2015 11:20 AM

## 2015-06-23 NOTE — Progress Notes (Signed)
Pt is in and the day room but isolative, in a flat, depressed mood but denies any SI/HI/VAH. Pt c/o withdrawal Sx and requested for Suboxone, pt re-educated about mediation schedules. No further concerns, remains compliant with medications, will continue to monitor for safety.

## 2015-06-23 NOTE — B[facility] Group Notes (Signed)
B[facility] Group Notes:  (Nursing/MHT/Case Management/Adjunct)  Date:  06/23/2015  Time:  4:06 PM  Type of Therapy:  Psychoeducational Skills  Participation Level:  Did Not Attend   Lynelle SmokeCara Travis [facility] Hospital[facility] 06/23/2015, 4:06 PM

## 2015-06-23 NOTE — Progress Notes (Signed)
Patient isolated in his room most of the day.He rated his depression 8/10.Denies suicidal or homicidal ideations.Stated that he is having "all " the withdrawal symptoms.Patient did not come to staff for any medications today.Compliant with medications.Appropriate with staff & peers.Appetite & energy level good.
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
## 2015-06-23 NOTE — Plan of Care (Signed)
Problem: Alteration in mood Goal: LTG-Pt's behavior demonstrates decreased signs of depression (Patient's behavior demonstrates decreased signs of depression to the point the patient is safe to return home and continue treatment in an outpatient setting)  Outcome: Not Progressing Pt continues to c/o depression, which he rates 10/10. He denies SI at this time.  Problem: Diagnosis: Increased Risk For Suicide Attempt Goal: STG-Patient Will Comply With Medication Regime Outcome: Progressing Pt taking medications as prescribed.

## 2015-06-24 MED ORDER — CYCLOBENZAPRINE HCL 10 MG PO TABS: 10.0000 mg | ORAL_TABLET | Freq: Three times a day (TID) | ORAL | Status: DC

## 2015-06-24 NOTE — BHH Suicide Risk Assessment (Signed)
BHH INPATIENT:  Family/Significant Other Suicide Prevention Education  Suicide Prevention Education:  Patient Refusal for Family/Significant Other Suicide Prevention Education: The patient Daniel [name] has refused to provide written consent for family/significant other to be provided Family/Significant Other Suicide Prevention Education during admission and/or prior to discharge.  Physician notified.  CSW completed SPE with family.  [name] 06/24/2015, 9:52 AM

## 2015-06-24 NOTE — Progress Notes (Signed)
Patient denies SI/HI, denies A/V hallucinations. Patient verbalizes understanding of discharge instructions, follow up care and prescriptions. Patient given all belongings from locker. Patient escorted out by staff. 

## 2015-06-24 NOTE — BHH Suicide Risk Assessment (Signed)
[facility]   Principal Problem: Major depression [facility](HCC) Discharge Diagnoses:  Patient Active Problem List   Diagnosis Date Noted  . Major depression (HCC) [F32.9] 06/20/2015  . Cocaine use disorder, moderate, dependence (HCC) [F14.20] 06/20/2015  . Opioid use with withdrawal (HCC) [F11.93] 06/20/2015  . Tobacco use disorder [F17.200] 06/20/2015  . Chronic pain [G89.29] 02/24/2015  . Opioid use disorder, severe, dependence (HCC) [F11.20] 02/24/2015  . Hypertension [I10] 08/02/2014  . Gastric reflux [K21.9] 08/02/2014     Psychiatric Specialty Exam: ROS  Blood pressure 124/58, pulse 83, temperature 98 F (36.7 C), temperature source Oral, resp. rate 20, height 6' (1.829 m), weight 127.007 kg (280 lb), SpO2 98 %.Body mass index is 37.97 kg/(m^2).                                                       Mental Status Per Nursing Assessment::   On Admission:     Demographic Factors:  Male, Divorced or widowed, Caucasian and Living alone  Loss Factors: Financial problems/change in socioeconomic status  Historical Factors: Impulsivity  Risk Reduction Factors:   NA  Continued Clinical Symptoms:  Alcohol/Substance Abuse/Dependencies Chronic Pain Previous Psychiatric Diagnoses and Treatments  Cognitive Features That Contribute To Risk:  None    Suicide Risk:  Minimal: No identifiable suicidal ideation.  Patients presenting with no risk factors but with morbid ruminations; may be classified as minimal risk based on the severity of the depressive symptoms  Follow-up Information    [facility] [place] [place].   Why:  Please arrive to the walk-in clinic between the hours of 9am-11:30am on Monday, Wednesday and Friday for an assessment for outpatient substance abuse treatment, medication managment and therapy.   Contact information:   [address] [place], [place] [address] Phone: [phone]  [phone] Fax: [phone]       [facility] [place].   Why:  Please arrive to the walk-in clinic Monday through Friday from 9-4pm for your assessment for your hospital follow up for medication management and therapy. for mental health services   Contact information:   [address] [place], [place] [address] Phone: [phone] Fax: [phone]        Jimmy FootmanHernandez-Gonzalez,  Shaylen Nephew, MD 06/24/2015, 11:49 AM

## 2015-06-24 NOTE — Progress Notes (Signed)
D: Pt remains isolative in his room this evening, only coming out for medications and snack. He forwards little information with assessment questions. Pt reports that he feels "bad", although he will not elaborate on what he means. Pt c/o chronic back pain which he rates a 9/10. He denies SI/HI/AVH at this time. Pt rates depression and anxiety as a 10/10. A: Emotional support and encouragement provided. Medications administered with education. q15 minute safety checks maintained. R: Pt remains free from harm. Will continue to monitor.

## 2015-06-24 NOTE — Progress Notes (Signed)
  [facility] Discharge Plan :  Will you be returning to the same living situation after discharge:  No. Pt [place] to live at a sober living house. At discharge, do you have transportation home?: Yes,  Pt will be picked up by his sober living house manager Tee. Do you have the ability to pay for your medications: Yes,  pt will be provided with prescriptions at discharge  Release of information consent forms completed and in the chart;  Patient's signature needed at discharge.  Patient to Follow up at: Follow-up Information    [facility] [place].   Why:  Please arrive to the walk-in clinic between the hours of 9am-11:30am on Monday, Wednesday and Friday for an assessment for outpatient substance abuse treatment, medication managment and therapy.   Contact information:   [address] [place], [place] [address] Phone: [phone] Fax: [phone]       [facility] with [facility] [place].   Why:  Please arrive to the walk-in clinic Monday through Friday from 9-4pm for your assessment for your hospital follow up for medication management and therapy. for mental health services   Contact information:   [address] [place], [place] [address] Phone: [phone]([phone] Fax: [phone]      Next level of care provider has access to [facility] Link:no  Safety Planning and Suicide Prevention discussed: Yes,  completed with pt  Have you used any form of tobacco in the last 30 days? (Cigarettes, Smokeless Tobacco, Cigars, and/or Pipes): No  Has patient been referred to the Quitline?: N/A patient is not a smoker  Patient has been referred for addiction treatment: Yes  Daniel PeaJonathan F Retha [name] 06/24/2015, 12:06 PM

## 2015-06-24 NOTE — Plan of Care (Signed)
Problem: Ineffective individual coping Goal: LTG: Patient will report a decrease in negative feelings Outcome: Not Progressing Patient reports that his mood is not better today

## 2015-06-24 NOTE — Plan of Care (Signed)
Problem: Ineffective individual coping Goal: STG: Patient will remain free from self harm Outcome: Progressing Patient remains free from self harm currently     
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
## 2015-06-24 NOTE — Progress Notes (Signed)
D:  Patient is alert and oriented on the unit this shift.  Patient did not attend am groups today.  Patient denies suicidal ideation, homicidal ideation, auditory or visual hallucinations at the present time.  Patient reports that his mood is not improved today with this depression and anxiety being rated an "8"  Patient reports that his back pain is discouraging him currently and he is "real sad" A:  Scheduled medications are administered to patient as per MD orders.  Emotional support and encouragement are provided.  Patient is maintained on q.15 minute safety checks.  Patient is informed to notify staff with questions or concerns. R:  No adverse medication reactions are noted.  Patient is cooperative with medication administration and treatment plan today.  Patient is receptive, calm and cooperative on the unit at this time.  Patient interacts well with others on the unit this shift.  Patient contracts for safety at this time.  Patient remains safe at this time.

## 2015-07-25 ENCOUNTER — Emergency Department
Admission: EM | Admit: 2015-07-25 | Discharge: 2015-07-25 | Disposition: A | Discharge status: Home / Self Care | Payer: Medicaid Other | Attending: Emergency Medicine | Admitting: Emergency Medicine

## 2015-07-25 ENCOUNTER — Emergency Department: Payer: Medicaid Other

## 2015-07-25 DIAGNOSIS — R202 Paresthesia of skin: Secondary | ICD-10-CM | POA: Insufficient documentation

## 2015-07-25 DIAGNOSIS — F17200 Nicotine dependence, unspecified, uncomplicated: Secondary | ICD-10-CM | POA: Insufficient documentation

## 2015-07-25 DIAGNOSIS — R200 Anesthesia of skin: Secondary | ICD-10-CM

## 2015-07-25 DIAGNOSIS — Y9285 Railroad track as the place of occurrence of the external cause: Secondary | ICD-10-CM | POA: Diagnosis not present

## 2015-07-25 DIAGNOSIS — S99911A Unspecified injury of right ankle, initial encounter: Secondary | ICD-10-CM | POA: Diagnosis present

## 2015-07-25 DIAGNOSIS — Z79899 Other long term (current) drug therapy: Secondary | ICD-10-CM | POA: Insufficient documentation

## 2015-07-25 DIAGNOSIS — M545 Low back pain, unspecified: Secondary | ICD-10-CM

## 2015-07-25 DIAGNOSIS — W010XXA Fall on same level from slipping, tripping and stumbling without subsequent striking against object, initial encounter: Secondary | ICD-10-CM | POA: Insufficient documentation

## 2015-07-25 DIAGNOSIS — I10 Essential (primary) hypertension: Secondary | ICD-10-CM | POA: Insufficient documentation

## 2015-07-25 DIAGNOSIS — Y999 Unspecified external cause status: Secondary | ICD-10-CM | POA: Diagnosis not present

## 2015-07-25 DIAGNOSIS — S93401A Sprain of unspecified ligament of right ankle, initial encounter: Secondary | ICD-10-CM | POA: Diagnosis not present

## 2015-07-25 DIAGNOSIS — Y9301 Activity, walking, marching and hiking: Secondary | ICD-10-CM | POA: Insufficient documentation

## 2015-07-25 MED ORDER — LIDOCAINE 5 % EX PTCH: 1.0000 | MEDICATED_PATCH | Freq: Two times a day (BID) | CUTANEOUS | Status: AC

## 2015-07-25 MED ORDER — LIDOCAINE 5 % EX PTCH
1.0000 | MEDICATED_PATCH | CUTANEOUS | Status: DC
  Administered 2015-07-25: 1 via TRANSDERMAL
  Filled 2015-07-25 (×2): qty 1

## 2015-07-25 MED ORDER — KETOROLAC TROMETHAMINE 60 MG/2ML IM SOLN
60.0000 mg | Freq: Once | INTRAMUSCULAR | Status: AC
  Administered 2015-07-25: 60 mg via INTRAMUSCULAR
  Filled 2015-07-25: qty 2

## 2015-07-25 MED ORDER — DIAZEPAM 5 MG PO TABS
5.0000 mg | ORAL_TABLET | Freq: Once | ORAL | Status: AC
  Administered 2015-07-25: 5 mg via ORAL
  Filled 2015-07-25: qty 1

## 2015-07-25 MED ORDER — ETODOLAC 200 MG PO CAPS: 200.0000 mg | ORAL_CAPSULE | Freq: Three times a day (TID) | ORAL | Status: DC

## 2015-07-25 MED ORDER — TRAMADOL HCL 50 MG PO TABS
50.0000 mg | ORAL_TABLET | Freq: Once | ORAL | Status: AC
  Administered 2015-07-25: 50 mg via ORAL
  Filled 2015-07-25: qty 1

## 2015-07-25 MED ORDER — DIAZEPAM 2 MG PO TABS: 2.0000 mg | ORAL_TABLET | Freq: Three times a day (TID) | ORAL | Status: AC | PRN

## 2015-07-25 MED ORDER — OXYCODONE-ACETAMINOPHEN 5-325 MG PO TABS
2.0000 | ORAL_TABLET | Freq: Once | ORAL | Status: AC
  Administered 2015-07-25: 2 via ORAL
  Filled 2015-07-25: qty 2
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
## 2015-07-25 NOTE — ED Notes (Signed)
Patient transported to MRI 

## 2015-07-25 NOTE — ED Notes (Signed)
  Reviewed d/c instructions, follow-up care, and prescriptions with pt. Pt verbalized understanding 

## 2015-07-25 NOTE — ED Notes (Signed)
Pt partial return of sensation to lower extremities. Pt states: "I can feel you touching my legs, it just feels kind of numb and tingly."

## 2015-07-25 NOTE — ED Notes (Signed)
Patient transported to X-ray 
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
## 2015-07-25 NOTE — ED Notes (Signed)
Pt called out to report he could not feel his legs. RN to room. Pt able to flex/extend bilateral legs/feet with no issue. Pt denies sensation to bilateral legs. MD Zenda AlpersWebster informed. MD Zenda AlpersWebster to bedside

## 2015-07-25 NOTE — Discharge Instructions (Signed)
Ankle Sprain An ankle sprain is an injury to the strong, fibrous tissues (ligaments) that hold the bones of your ankle joint together.  CAUSES An ankle sprain is usually caused by a fall or by twisting your ankle. Ankle sprains most commonly occur when you step on the outer edge of your foot, and your ankle turns inward. People who participate in sports are more prone to these types of injuries.  SYMPTOMS   Pain in your ankle. The pain may be present at rest or only when you are trying to stand or walk.  Swelling.  Bruising. Bruising may develop immediately or within 1 to 2 days after your injury.  Difficulty standing or walking, particularly when turning corners or changing directions. DIAGNOSIS  Your caregiver will ask you details about your injury and perform a physical exam of your ankle to determine if you have an ankle sprain. During the physical exam, your caregiver will press on and apply pressure to specific areas of your foot and ankle. Your caregiver will try to move your ankle in certain ways. An X-ray exam may be done to be sure a bone was not broken or a ligament did not separate from one of the bones in your ankle (avulsion fracture).  TREATMENT  Certain types of braces can help stabilize your ankle. Your caregiver can make a recommendation for this. Your caregiver may recommend the use of medicine for pain. If your sprain is severe, your caregiver may refer you to a surgeon who helps to restore function to parts of your skeletal system (orthopedist) or a physical therapist. [place] ice to your injury for 1-2 days or as directed by your caregiver. Applying ice helps to reduce inflammation and pain.  Put ice in a plastic bag.  Place a towel between your skin and the bag.  Leave the ice on for 15-20 minutes at a time, every 2 hours while you are awake.  Only take over-the-counter or prescription medicines for pain, discomfort, or fever as directed by  your caregiver.  Elevate your injured ankle above the level of your heart as much as possible for 2-3 days.  If your caregiver recommends crutches, use them as instructed. Gradually put weight on the affected ankle. Continue to use crutches or a cane until you can walk without feeling pain in your ankle.  If you have a plaster splint, wear the splint as directed by your caregiver. Do not rest it on anything harder than a pillow for the first 24 hours. Do not put weight on it. Do not get it wet. You may take it off to take a shower or bath.  You may have been given an elastic bandage to wear around your ankle to provide support. If the elastic bandage is too tight (you have numbness or tingling in your foot or your foot becomes cold and blue), adjust the bandage to make it comfortable.  If you have an air splint, you may blow more air into it or let air out to make it more comfortable. You may take your splint off at night and before taking a shower or bath. Wiggle your toes in the splint several times per day to decrease swelling. SEEK MEDICAL CARE IF:   You have rapidly increasing bruising or swelling.  Your toes feel extremely cold or you lose feeling in your foot.  Your pain is not relieved with medicine. SEEK IMMEDIATE MEDICAL CARE IF:  Your toes are numb or blue.  You have severe pain that is increasing. MAKE SURE YOU:   Understand these instructions.  Will watch your condition.  Will get help right away if you are not doing well or get worse.   This information is not intended to replace advice given to you by your health care provider. Make sure you discuss any questions you have with your health care provider.   Document Released: 02/15/2005 Document Revised: 03/08/2014 Document Reviewed: 02/27/2011 Elsevier Interactive Patient Education 2016 [employer] Injury Prevention Back injuries can be very painful. They can also be difficult to heal. After having one back  injury, you are more likely to injure your back again. It is important to learn how to avoid injuring or re-injuring your back. The following tips can help you to prevent a back injury. WHAT SHOULD I KNOW ABOUT PHYSICAL FITNESS?  Exercise for 30 minutes per day on most days of the week or as told by your doctor. Make sure to:  Do aerobic exercises, such as walking, jogging, biking, or swimming.  Do exercises that increase balance and strength, such as tai chi and yoga.  Do stretching exercises. This helps with flexibility.  Try to develop strong belly (abdominal) muscles. Your belly muscles help to support your back.  Stay at a healthy weight. This helps to decrease your risk of a back injury. WHAT SHOULD I KNOW ABOUT MY DIET?  Talk with your doctor about your overall diet. Take supplements and vitamins only as told by your doctor.  Talk with your doctor about how much calcium and vitamin D you need each day. These nutrients help to prevent weakening of the bones (osteoporosis).  Include good sources of calcium in your diet, such as:  Dairy products.  Green leafy vegetables.  Products that have had calcium added to them (fortified).  Include good sources of vitamin D in your diet, such as:  Milk.  Foods that have had vitamin D added to them. WHAT SHOULD I KNOW ABOUT MY POSTURE?  Sit up straight and stand up straight. Avoid leaning forward when you sit or hunching over when you stand.  Choose chairs that have good low-back (lumbar) support.  If you work at a desk, sit close to it so you do not need to lean over. Keep your chin tucked in. Keep your neck drawn back. Keep your elbows bent so your arms look like the letter "L" (right angle).  Sit high and close to the steering wheel when you drive. Add a low-back support to your car seat, if needed.  Avoid sitting or standing in one position for very long. Take breaks to get up, stretch, and walk around at least one time every  hour. Take breaks every hour if you are driving for long periods of time.  Sleep on your side with your knees slightly bent, or sleep on your back with a pillow under your knees. Do not lie on the front of your body to sleep. WHAT SHOULD I KNOW ABOUT LIFTING, TWISTING, AND REACHING Lifting and Heavy Lifting  Avoid heavy lifting, especially lifting over and over again. If you must do heavy lifting:  Stretch before lifting.  Work slowly.  Rest between lifts.  Use a tool such as a cart or a dolly to move objects if one is available.  Make several small trips instead of carrying one heavy load.  Ask for help when you need it, especially when moving big objects.  Follow these steps when lifting:  Stand with your feet shoulder-width apart.  Get as close to the object as you can. Do not pick up a heavy object that is far from your body.  Use handles or lifting straps if they are available.  Bend at your knees. Squat down, but keep your heels off the floor.  Keep your shoulders back. Keep your chin tucked in. Keep your back straight.  Lift the object slowly while you tighten the muscles in your legs, belly, and butt. Keep the object as close to the center of your body as possible.  Follow these steps when putting down a heavy load:  Stand with your feet shoulder-width apart.  Lower the object slowly while you tighten the muscles in your legs, belly, and butt. Keep the object as close to the center of your body as possible.  Keep your shoulders back. Keep your chin tucked in. Keep your back straight.  Bend at your knees. Squat down, but keep your heels off the floor.  Use handles or lifting straps if they are available. Twisting and Reaching  Avoid lifting heavy objects above your waist.  Do not twist at your waist while you are lifting or carrying a load. If you need to turn, move your feet.  Do not bend over without bending at your knees.  Avoid reaching over your  head, across a table, or for an object on a high surface.  WHAT ARE SOME OTHER TIPS?  Avoid wet floors and icy ground. Keep sidewalks clear of ice to prevent falls.   Do not sleep on a mattress that is too soft or too hard.   Keep items that you use often within easy reach.   Put heavier objects on shelves at waist level, and put lighter objects on lower or higher shelves.  Find ways to lower your stress, such as:  Exercise.  Massage.  Relaxation techniques.  Talk with your doctor if you feel anxious or depressed. These conditions can make back pain worse.  Wear flat heel shoes with cushioned soles.  Avoid making quick (sudden) movements.  Use both shoulder straps when carrying a backpack.  Do not use any tobacco products, including cigarettes, chewing tobacco, or electronic cigarettes. If you need help quitting, ask your doctor.   This information is not intended to replace advice given to you by your health care provider. Make sure you discuss any questions you have with your health care provider.   Document Released: 08/04/2007 Document Revised: 07/02/2014 Document Reviewed: 02/19/2014 Elsevier Interactive Patient Education 2016 [employer]  Back Pain, Adult Back pain is very common in adults.The cause of back pain is rarely dangerous and the pain often gets better over time.The cause of your back pain may not be known. Some common causes of back pain include:  Strain of the muscles or ligaments supporting the spine.  Wear and tear (degeneration) of the spinal disks.  Arthritis.  Direct injury to the back. For many people, back pain may return. Since back pain is rarely dangerous, most people can learn to manage this condition on their own. HOME CARE INSTRUCTIONS Watch your back pain for any changes. The following actions may help to lessen any discomfort you are feeling:  Remain active. It is stressful on your back to sit or stand in one place for long  periods of time. Do not sit, drive, or stand in one place for more than 30 minutes at a time. Take short walks on even surfaces as soon as you  are able.Try to increase the length of time you walk each day.  Exercise regularly as directed by your health care provider. Exercise helps your back heal faster. It also helps avoid future injury by keeping your muscles strong and flexible.  Do not stay in bed.Resting more than 1-2 days can delay your recovery.  Pay attention to your body when you bend and lift. The most comfortable positions are those that put less stress on your recovering back. Always use proper lifting techniques, including:  Bending your knees.  Keeping the load close to your body.  Avoiding twisting.  Find a comfortable position to sleep. Use a firm mattress and lie on your side with your knees slightly bent. If you lie on your back, put a pillow under your knees.  Avoid feeling anxious or stressed.Stress increases muscle tension and can worsen back pain.It is important to recognize when you are anxious or stressed and learn ways to manage it, such as with exercise.  Take medicines only as directed by your health care provider. Over-the-counter medicines to reduce pain and inflammation are often the most helpful.Your health care provider may prescribe muscle relaxant drugs.These medicines help dull your pain so you can more quickly return to your normal activities and healthy exercise.  Apply ice to the injured area:  Put ice in a plastic bag.  Place a towel between your skin and the bag.  Leave the ice on for 20 minutes, 2-3 times a day for the first 2-3 days. After that, ice and heat may be alternated to reduce pain and spasms.  Maintain a healthy weight. Excess weight puts extra stress on your back and makes it difficult to maintain good posture. SEEK MEDICAL CARE IF:  You have pain that is not relieved with rest or medicine.  You have increasing pain going  down into the legs or buttocks.  You have pain that does not improve in one week.  You have night pain.  You lose weight.  You have a fever or chills. SEEK IMMEDIATE MEDICAL CARE IF:   You develop new bowel or bladder control problems.  You have unusual weakness or numbness in your arms or legs.  You develop nausea or vomiting.  You develop abdominal pain.  You feel faint.   This information is not intended to replace advice given to you by your health care provider. Make sure you discuss any questions you have with your health care provider.   Document Released: 02/15/2005 Document Revised: 03/08/2014 Document Reviewed: 06/19/2013 Elsevier Interactive Patient Education 2016 [employer]  Musculoskeletal Pain Musculoskeletal pain is muscle and boney aches and pains. These pains can occur in any part of the body. Your caregiver may treat you without knowing the cause of the pain. They may treat you if blood or urine tests, X-rays, and other tests were normal.  CAUSES There is often not a definite cause or reason for these pains. These pains may be caused by a type of germ (virus). The discomfort may also come from overuse. Overuse includes working out too hard when your body is not fit. Boney aches also come from weather changes. Bone is sensitive to atmospheric pressure changes. HOME CARE INSTRUCTIONS   Ask when your test results will be ready. Make sure you get your test results.  Only take over-the-counter or prescription medicines for pain, discomfort, or fever as directed by your caregiver. If you were given medications for your condition, do not drive, operate machinery or power tools, or  sign legal documents for 24 hours. Do not drink alcohol. Do not take sleeping pills or other medications that may interfere with treatment.  Continue all activities unless the activities cause more pain. When the pain lessens, slowly resume normal activities. Gradually increase the  intensity and duration of the activities or exercise.  During periods of severe pain, bed rest may be helpful. Lay or sit in any position that is comfortable.  Putting ice on the injured area.  Put ice in a bag.  Place a towel between your skin and the bag.  Leave the ice on for 15 to 20 minutes, 3 to 4 times a day.  Follow up with your caregiver for continued problems and no reason can be found for the pain. If the pain becomes worse or does not go away, it may be necessary to repeat tests or do additional testing. Your caregiver may need to look further for a possible cause. SEEK IMMEDIATE MEDICAL CARE IF:  You have pain that is getting worse and is not relieved by medications.  You develop chest pain that is associated with shortness or breath, sweating, feeling sick to your stomach (nauseous), or throw up (vomit).  Your pain becomes localized to the abdomen.  You develop any new symptoms that seem different or that concern you. MAKE SURE YOU:   Understand these instructions.  Will watch your condition.  Will get help right away if you are not doing well or get worse.   This information is not intended to replace advice given to you by your health care provider. Make sure you discuss any questions you have with your health care provider.   Document Released: 02/15/2005 Document Revised: 05/10/2011 Document Reviewed: 10/20/2012 Elsevier Interactive Patient Education 2016 [employer]  Paresthesia Paresthesia is a burning or prickling feeling. This feeling can happen in any part of the body. It often happens in the hands, arms, legs, or feet. Usually, it is not painful. In most cases, the feeling goes away in a short time and is not a sign of a serious problem. HOME CARE  Avoid drinking alcohol.  Try massage or needle therapy (acupuncture) to help with your problems.  Keep all follow-up visits as told by your doctor. This is important. GET HELP IF:  You keep on having  episodes of paresthesia.  Your burning or prickling feeling gets worse when you walk.  You have pain or cramps.  You feel dizzy.  You have a rash. GET HELP RIGHT AWAY IF:  You feel weak.  You have trouble walking or moving.  You have problems speaking, understanding, or seeing.  You feel confused.  You cannot control when you pee (urinate) or poop (bowel movement).  You lose feeling (numbness) after an injury.  You pass out (faint).   This information is not intended to replace advice given to you by your health care provider. Make sure you discuss any questions you have with your health care provider.   Document Released: 01/29/2008 Document Revised: 07/02/2014 Document Reviewed: 02/11/2014 Elsevier Interactive Patient Education [employer].

## 2015-07-31 ENCOUNTER — Other Ambulatory Visit: Payer: Self-pay | Admitting: Psychiatry

## 2015-08-21 ENCOUNTER — Other Ambulatory Visit: Payer: Self-pay | Admitting: Psychiatry

## 2016-04-15 ENCOUNTER — Encounter: Payer: Self-pay | Admitting: *Deleted

## 2016-04-15 ENCOUNTER — Emergency Department
Admission: EM | Admit: 2016-04-15 | Discharge: 2016-04-15 | Disposition: A | Discharge status: Home / Self Care | Payer: Medicaid Other | Attending: Emergency Medicine | Admitting: Emergency Medicine

## 2016-04-15 DIAGNOSIS — R339 Retention of urine, unspecified: Secondary | ICD-10-CM | POA: Insufficient documentation

## 2016-04-15 DIAGNOSIS — F17200 Nicotine dependence, unspecified, uncomplicated: Secondary | ICD-10-CM | POA: Diagnosis not present

## 2016-04-15 DIAGNOSIS — R21 Rash and other nonspecific skin eruption: Secondary | ICD-10-CM | POA: Insufficient documentation

## 2016-04-15 DIAGNOSIS — T402X5A Adverse effect of other opioids, initial encounter: Secondary | ICD-10-CM | POA: Insufficient documentation

## 2016-04-15 DIAGNOSIS — Z79899 Other long term (current) drug therapy: Secondary | ICD-10-CM | POA: Diagnosis not present

## 2016-04-15 DIAGNOSIS — I10 Essential (primary) hypertension: Secondary | ICD-10-CM | POA: Diagnosis not present

## 2016-04-15 DIAGNOSIS — Z791 Long term (current) use of non-steroidal anti-inflammatories (NSAID): Secondary | ICD-10-CM | POA: Diagnosis not present

## 2016-04-15 DIAGNOSIS — R338 Other retention of urine: Secondary | ICD-10-CM

## 2016-04-15 DIAGNOSIS — T7840XA Allergy, unspecified, initial encounter: Secondary | ICD-10-CM

## 2016-04-15 LAB — SALICYLATE LEVEL: Salicylate Lvl: <7 mg/dL (ref 2.8–30.0)

## 2016-04-15 LAB — URINE DRUG SCREEN, QUALITATIVE (ARMC ONLY)
Amphetamines, Ur Screen: POSITIVE — AB
BARBITURATES, UR SCREEN: NOT DETECTED
BENZODIAZEPINE, UR SCRN: POSITIVE — AB
CANNABINOID 50 NG, UR ~~LOC~~: NOT DETECTED
COCAINE METABOLITE, UR ~~LOC~~: NOT DETECTED
MDMA (Ecstasy)Ur Screen: NOT DETECTED
METHADONE SCREEN, URINE: NOT DETECTED
OPIATE, UR SCREEN: NOT DETECTED
Phencyclidine (PCP) Ur S: NOT DETECTED
TRICYCLIC, UR SCREEN: POSITIVE — AB

## 2016-04-15 LAB — URINALYSIS, COMPLETE (UACMP) WITH MICROSCOPIC
BILIRUBIN URINE: NEGATIVE
Bacteria, UA: NONE SEEN
Glucose, UA: NEGATIVE mg/dL
HGB URINE DIPSTICK: NEGATIVE
KETONES UR: NEGATIVE mg/dL
LEUKOCYTES UA: NEGATIVE
Nitrite: NEGATIVE
PH: 5 (ref 5.0–8.0)
Protein, ur: NEGATIVE mg/dL
RBC / HPF: NONE SEEN RBC/hpf (ref 0–5)
SPECIFIC GRAVITY, URINE: 1.016 (ref 1.005–1.030)
SQUAMOUS EPITHELIAL / LPF: NONE SEEN
WBC UA: NONE SEEN WBC/hpf (ref 0–5)

## 2016-04-15 LAB — CBC WITH DIFFERENTIAL/PLATELET
Basophils Absolute: 0 10*3/uL (ref 0–0.1)
Basophils Relative: 0 %
Eosinophils Absolute: 0.1 10*3/uL (ref 0–0.7)
Eosinophils Relative: 1 %
HEMATOCRIT: 39.2 % — AB (ref 40.0–52.0)
Hemoglobin: 13.4 g/dL (ref 13.0–18.0)
LYMPHS ABS: 1.2 10*3/uL (ref 1.0–3.6)
LYMPHS PCT: 15 %
MCH: 30.5 pg (ref 26.0–34.0)
MCHC: 34.1 g/dL (ref 32.0–36.0)
MCV: 89.5 fL (ref 80.0–100.0)
MONOS PCT: 7 %
Monocytes Absolute: 0.6 10*3/uL (ref 0.2–1.0)
NEUTROS ABS: 6.2 10*3/uL (ref 1.4–6.5)
Neutrophils Relative %: 77 %
Platelets: 220 10*3/uL (ref 150–440)
RBC: 4.38 MIL/uL — AB (ref 4.40–5.90)
RDW: 13.8 % (ref 11.5–14.5)
WBC: 8.2 10*3/uL (ref 3.8–10.6)

## 2016-04-15 LAB — COMPREHENSIVE METABOLIC PANEL
ALBUMIN: 4.1 g/dL (ref 3.5–5.0)
ALT: 60 U/L (ref 17–63)
ANION GAP: 8 (ref 5–15)
AST: 40 U/L (ref 15–41)
Alkaline Phosphatase: 75 U/L (ref 38–126)
BUN: 15 mg/dL (ref 6–20)
CHLORIDE: 94 mmol/L — AB (ref 101–111)
CO2: 29 mmol/L (ref 22–32)
Calcium: 8.4 mg/dL — ABNORMAL LOW (ref 8.9–10.3)
Creatinine, Ser: 0.89 mg/dL (ref 0.61–1.24)
GFR calc Af Amer: >60 mL/min (ref >60)
GFR calc non Af Amer: >60 mL/min (ref >60)
GLUCOSE: 147 mg/dL — AB (ref 65–99)
POTASSIUM: 3.5 mmol/L (ref 3.5–5.1)
SODIUM: 131 mmol/L — AB (ref 135–145)
Total Bilirubin: 0.7 mg/dL (ref 0.3–1.2)
Total Protein: 7.8 g/dL (ref 6.5–8.1)

## 2016-04-15 LAB — ACETAMINOPHEN LEVEL

## 2016-04-15 LAB — ETHANOL: Alcohol, Ethyl (B): <5 mg/dL (ref <5)

## 2016-04-15 MED ORDER — FAMOTIDINE 40 MG PO TABS: 40.0000 mg | ORAL_TABLET | Freq: Two times a day (BID) | ORAL | 0 refills | Status: DC

## 2016-04-15 MED ORDER — DIPHENHYDRAMINE HCL 50 MG/ML IJ SOLN
50.0000 mg | Freq: Once | INTRAMUSCULAR | Status: AC
  Administered 2016-04-15: 50 mg via INTRAVENOUS
  Filled 2016-04-15: qty 1

## 2016-04-15 MED ORDER — FAMOTIDINE IN NACL 20-0.9 MG/50ML-% IV SOLN
20.0000 mg | Freq: Once | INTRAVENOUS | Status: AC
  Administered 2016-04-15: 20 mg via INTRAVENOUS
  Filled 2016-04-15: qty 50

## 2016-04-15 MED ORDER — OXYCODONE-ACETAMINOPHEN 5-325 MG PO TABS
2.0000 | ORAL_TABLET | Freq: Once | ORAL | Status: AC
  Administered 2016-04-15: 2 via ORAL
  Filled 2016-04-15: qty 2

## 2016-04-15 MED ORDER — DIPHENHYDRAMINE HCL 25 MG PO CAPS: 25.0000 mg | ORAL_CAPSULE | ORAL | 0 refills | Status: DC | PRN

## 2016-04-15 MED ORDER — PREDNISONE 20 MG PO TABS
60.0000 mg | ORAL_TABLET | Freq: Every day | ORAL | 0 refills | Status: AC
Start: 2016-04-15 — End: 2016-04-20

## 2016-04-15 MED ORDER — OXYCODONE-ACETAMINOPHEN 10-325 MG PO TABS: 1.0000 | ORAL_TABLET | Freq: Four times a day (QID) | ORAL | 0 refills | Status: DC | PRN

## 2016-04-15 MED ORDER — METHYLPREDNISOLONE SODIUM SUCC 125 MG IJ SOLR
125.0000 mg | Freq: Once | INTRAMUSCULAR | Status: AC
  Administered 2016-04-15: 125 mg via INTRAVENOUS
  Filled 2016-04-15: qty 2

## 2016-04-15 MED ORDER — SODIUM CHLORIDE 0.9 % IV BOLUS (SEPSIS)
1000.0000 mL | Freq: Once | INTRAVENOUS | Status: AC
  Administered 2016-04-15: 1000 mL via INTRAVENOUS

## 2016-04-15 MED ORDER — MORPHINE SULFATE (PF) 4 MG/ML IV SOLN
4.0000 mg | Freq: Once | INTRAVENOUS | Status: AC
  Administered 2016-04-15: 4 mg via INTRAVENOUS
  Filled 2016-04-15: qty 1

## 2016-04-15 NOTE — Discharge Instructions (Signed)
Please return to the emergency department for any new or worsening symptoms such as fevers, chills, worsening pain, if your Foley catheter stopped working, or for any other concerns. Otherwise follow up with [facility] tomorrow for your chronic pain and follow-up with Dr. Apolinar JunesBrandon in one week for a trial of void.

## 2016-04-15 NOTE — ED Notes (Signed)
Pt states started saboxone yest, started having itchy hives, urinary retention, vomiting. Has not taken any today because of symptoms.

## 2016-04-15 NOTE — ED Provider Notes (Signed)
[facility] Emergency Department Provider Note  ____________________________________________   First MD Initiated Contact with Patient 04/15/16 1404     (approximate)  I have reviewed the triage vital signs and the nursing notes.   HISTORY  Chief Complaint Medication Reaction    HPI Daniel [name] is a 51 y.o. male who comes to the emergency department with 24 hours of itching rash across his face and chest that began about 30 minutes after taking his first lifetime dose of Suboxone. His chronic low back pain and had previously been taking Percocet and fentanyl for many years although has not taken either of those medications in about a month or 2. 2 days ago he went to [facility] and was begun on behavioral therapy and Suboxone for his chronic pain. He took the first dose and shortly thereafter developed hives itching and rash. No shortness of breath. No abdominal pain nausea or vomiting. He's never had this reaction before. He also says that after taking the Suboxone he's been unable to urinate. He drinks 16 bottles of water and cannot P. No fevers or chills. No back pain or flank pain. He does feel mild to moderate fullness and discomfort in the suprapubic area and nonradiating. Nothing makes it better or worse.   Past Medical History:  Diagnosis Date  . Anxiety   . Chronic pain   . Hypertension   . Opioid abuse     Patient Active Problem List   Diagnosis Date Noted  . Major depression 06/20/2015  . Cocaine use disorder, moderate, dependence (HCC) 06/20/2015  . Opioid use with withdrawal (HCC) 06/20/2015  . Tobacco use disorder 06/20/2015  . Chronic pain 02/24/2015  . Opioid use disorder, severe, dependence (HCC) 02/24/2015  . Hypertension 08/02/2014  . Gastric reflux 08/02/2014    Past Surgical History:  Procedure Laterality Date  . KNEE SURGERY Left     Prior to Admission medications   Medication Sig Start Date End Date Taking? Authorizing  Provider  amitriptyline (ELAVIL) 50 MG tablet Take 150 mg by mouth at bedtime. Reported on 07/25/2015. On Hold at Pharmacy    Historical Provider, MD  diazepam (VALIUM) 2 MG tablet Take 1 tablet (2 mg total) by mouth every 8 (eight) hours as needed for anxiety. 07/25/15 07/24/16  Rebecka ApleyAllison P Webster, MD  diphenhydrAMINE (BENADRYL) 25 mg capsule Take 1 capsule (25 mg total) by mouth every 4 (four) hours as needed for itching or allergies. 04/15/16 04/15/17  Merrily BrittleNeil Sheddrick Lattanzio, MD  escitalopram (LEXAPRO) 20 MG tablet Take 1 tablet (20 mg total) by mouth daily. 06/23/15   Jimmy FootmanAndrea Hernandez-Gonzalez, MD  etodolac (LODINE) 200 MG capsule Take 1 capsule (200 mg total) by mouth every 8 (eight) hours. 07/25/15   Rebecka ApleyAllison P Webster, MD  famotidine (PEPCID) 40 MG tablet Take 1 tablet (40 mg total) by mouth 2 (two) times daily. 04/15/16 04/20/16  Merrily BrittleNeil Marifer Hurd, MD  gabapentin (NEURONTIN) 300 MG capsule Take 600 mg by mouth 3 (three) times daily.    Historical Provider, MD  lidocaine (LIDODERM) 5 % Place 1 patch onto the skin every 12 (twelve) hours. Remove & Discard patch within 12 hours or as directed by MD 07/25/15 07/24/16  Rebecka ApleyAllison P Webster, MD  lurasidone (LATUDA) 80 MG TABS tablet Take 1 tablet (80 mg total) by mouth daily with supper. 06/23/15   Jimmy FootmanAndrea Hernandez-Gonzalez, MD  oxyCODONE-acetaminophen (PERCOCET) 10-325 MG tablet Take 1 tablet by mouth every 6 (six) hours as needed for pain. 04/15/16   Lloyd HugerNeil  Libertie Hausler, MD  predniSONE (DELTASONE) 20 MG tablet Take 3 tablets (60 mg total) by mouth daily. 04/15/16 04/20/16  Merrily Brittle, MD  pregabalin (LYRICA) 150 MG capsule Take 150 mg by mouth 3 (three) times daily.    Historical Provider, MD  traZODone (DESYREL) 150 MG tablet Take 1 tablet (150 mg total) by mouth at bedtime. 06/23/15   Jimmy Footman, MD    Allergies Hydrocodone-acetaminophen  Family History  Problem Relation Age of Onset  .       Social History Social History  Substance Use Topics  .  Smoking status: Current Every Day Smoker  . Smokeless tobacco: Never Used  . Alcohol use No    Review of Systems Constitutional: No fever/chills Eyes: No visual changes. ENT: No sore throat. Cardiovascular: Denies chest pain. Respiratory: Denies shortness of breath. Gastrointestinal: No abdominal pain.  No nausea, no vomiting.  No diarrhea.  No constipation. Genitourinary: Unable to urinate Musculoskeletal: Negative for back pain. Skin: Positive for rash. Neurological: Negative for headaches, focal weakness or numbness.  10-point ROS otherwise negative.  ____________________________________________   PHYSICAL EXAM:  VITAL SIGNS: ED Triage Vitals  Enc Vitals Group     BP 04/15/16 1248 130/78     Pulse Rate 04/15/16 1248 (!) 120     Resp 04/15/16 1248 18     Temp 04/15/16 1248 98.7 F (37.1 C)     Temp Source 04/15/16 1248 Oral     SpO2 04/15/16 1248 95 %     Weight 04/15/16 1248 (!) 320 lb (145.2 kg)     Height 04/15/16 1248 6' (1.829 m)     Head Circumference --      Peak Flow --      Pain Score 04/15/16 1249 9     Pain Loc --      Pain Edu? --      Excl. in GC? --     Constitutional: Alert and oriented. Well appearing and in no acute distress.Laughing and joking very Eyes: Conjunctivae are normal. PERRL. EOMI. Head: Atraumatic. Nose: No congestion/rhinnorhea. Mouth/Throat: Mucous membranes are moist.  Oropharynx non-erythematous. Neck: No stridor.   Cardiovascular: Tachycardic rate, regular rhythm. Grossly normal heart sounds.  Good peripheral circulation. Respiratory: Normal respiratory effort.  No retractions. Lungs CTAB. Gastrointestinal: Morbidly obese soft nondistended mild suprapubic tenderness no rebound or guarding or peritonitis Musculoskeletal: No lower extremity tenderness nor edema.  No joint effusions. Neurologic:  Normal speech and language. No gross focal neurologic deficits are appreciated. No gait instability. Skin:  Blanching erythematous rash  across his face with mild wheals Psychiatric: Mood and affect are normal. Speech and behavior are normal.  ____________________________________________   LABS (all labs ordered are listed, but only abnormal results are displayed)  Labs Reviewed  ACETAMINOPHEN LEVEL - Abnormal; Notable for the following:       Result Value   Acetaminophen (Tylenol), Serum <10 (*)    All other components within normal limits  COMPREHENSIVE METABOLIC PANEL - Abnormal; Notable for the following:    Sodium 131 (*)    Chloride 94 (*)    Glucose, Bld 147 (*)    Calcium 8.4 (*)    All other components within normal limits  CBC WITH DIFFERENTIAL/PLATELET - Abnormal; Notable for the following:    RBC 4.38 (*)    HCT 39.2 (*)    All other components within normal limits  URINALYSIS, COMPLETE (UACMP) WITH MICROSCOPIC - Abnormal; Notable for the following:    Color, Urine YELLOW (*)  APPearance CLEAR (*)    All other components within normal limits  URINE DRUG SCREEN, QUALITATIVE ([facility] ONLY) - Abnormal; Notable for the following:    Tricyclic, Ur Screen POSITIVE (*)    Amphetamines, Ur Screen POSITIVE (*)    Benzodiazepine, Ur Scrn POSITIVE (*)    All other components within normal limits  ETHANOL  SALICYLATE LEVEL   ____________________________________________  EKG  ED ECG REPORT I, Merrily Brittle, the attending physician, personally viewed and interpreted this ECG.  Date: 04/15/2016 Rate: 117 Rhythm: Sinus tachycardia QRS Axis: normal Intervals: normal ST/T Wave abnormalities: normal Conduction Disturbances: none Narrative Interpretation: unremarkable  ____________________________________________  RADIOLOGY   ____________________________________________   PROCEDURES  Procedure(s) performed: no  Procedures  Critical Care performed: no  ____________________________________________   INITIAL IMPRESSION / ASSESSMENT AND PLAN / ED COURSE  Pertinent labs & imaging results that  were available during my care of the patient were reviewed by me and considered in my medical decision making (see chart for details).    Clinical Course as of Apr 16 1607  Thu Apr 15, 2016  1540 The patient is no longer itching and his rash is improved. His Foley drained an initial 1.3 L of fluid but fortunately he has no renal dysfunction. Sodium: (!) 131 [NR]    Clinical Course User Index [NR] Merrily Brittle, MD   ----------------------------------------- 2:30 PM on 04/15/2016 -----------------------------------------  The patient's bladder scan reveals greater than 1 L of urine. He consents to indwelling Foley catheter now.  ----------------------------------------- 4:11 PM on 04/15/2016 -----------------------------------------  The patient had a total of 1.3 L of urine output and his tachycardia improved and his pain resolved. His urinalysis is negative for infection and this is most likely a medication side effect. He is able to go back to his pain management center tomorrow for reevaluation. I will treat him with a short course of steroids and Benadryl and instructed to follow up with his primary care physician. He is medically stable for outpatient management. ____________________________________________   FINAL CLINICAL IMPRESSION(S) / ED DIAGNOSES  Final diagnoses:  Allergic reaction to drug, initial encounter  Acute urinary retention      NEW MEDICATIONS STARTED DURING THIS VISIT:  New Prescriptions   DIPHENHYDRAMINE (BENADRYL) 25 MG CAPSULE    Take 1 capsule (25 mg total) by mouth every 4 (four) hours as needed for itching or allergies.   FAMOTIDINE (PEPCID) 40 MG TABLET    Take 1 tablet (40 mg total) by mouth 2 (two) times daily.   OXYCODONE-ACETAMINOPHEN (PERCOCET) 10-325 MG TABLET    Take 1 tablet by mouth every 6 (six) hours as needed for pain.   PREDNISONE (DELTASONE) 20 MG TABLET    Take 3 tablets (60 mg total) by mouth daily.     Note:  This document  was prepared using Dragon voice recognition software and may include unintentional dictation errors.     Merrily Brittle, MD 04/15/16 [phone]

## 2016-04-15 NOTE — ED Notes (Signed)
Pt shown how to change standard drainage bag to leg bag. Pt and roommate verbalized understanding of changing bags.

## 2016-04-15 NOTE — ED Triage Notes (Addendum)
States he believes he is having a reaction to saboxone, states he had a dose last night and this AM, states he has hives and has not been able to sleep and headache, awake and alert in no acute distress

## 2016-04-15 NOTE — ED Notes (Signed)
Bladder scan showed >[id number]   Pt states drank 16 bottles of water "back to back" yest and has only trickled pee since then, denies hx of urinary retention or prostate issues. States having an allergic reaction to saboxone that also began yest. Face is red.  Told this RN he does not want a foley catheter.

## 2016-04-22 ENCOUNTER — Emergency Department
Admission: EM | Admit: 2016-04-22 | Discharge: 2016-04-22 | Disposition: A | Discharge status: Home / Self Care | Payer: Medicaid Other | Attending: Emergency Medicine | Admitting: Emergency Medicine

## 2016-04-22 DIAGNOSIS — Z466 Encounter for fitting and adjustment of urinary device: Secondary | ICD-10-CM | POA: Diagnosis present

## 2016-04-22 DIAGNOSIS — F17200 Nicotine dependence, unspecified, uncomplicated: Secondary | ICD-10-CM | POA: Insufficient documentation

## 2016-04-22 DIAGNOSIS — Z79899 Other long term (current) drug therapy: Secondary | ICD-10-CM | POA: Diagnosis not present

## 2016-04-22 DIAGNOSIS — I10 Essential (primary) hypertension: Secondary | ICD-10-CM | POA: Insufficient documentation

## 2016-04-22 HISTORY — DX: Retention of urine, unspecified: R33.9

## 2016-04-22 NOTE — ED Notes (Signed)
Pt voided 300cc of clear yellow urine after foley removed

## 2016-04-22 NOTE — ED Provider Notes (Signed)
[facility] Emergency Department Provider Note  ____________________________________________  Time seen: Approximately 11:51 AM  I have reviewed the triage vital signs and the nursing notes.   HISTORY  Chief Complaint foley removal    HPI Daniel [name] is a 51 y.o. male that presents to the emergency department for Foley removal that was supposed to be removed on Monday. Patient states that the urologist could not get him in an appointment for 1-2 weeks. Patient proceeded to try to pull the catheter out himself. Patient states he initially saw some blood in the catheter bag. Patient states that he has not seen any blood in his catheter bag since Tuesday. He has had pain since trying to pull the catheter out himself. Patient denies fever, nausea, vomiting, abdominal pain, penile discharge, rash.   Past Medical History:  Diagnosis Date  . Anxiety   . Chronic pain   . Hypertension   . Opioid abuse   . Urinary retention     Patient Active Problem List   Diagnosis Date Noted  . Major depression 06/20/2015  . Cocaine use disorder, moderate, dependence (HCC) 06/20/2015  . Opioid use with withdrawal (HCC) 06/20/2015  . Tobacco use disorder 06/20/2015  . Chronic pain 02/24/2015  . Opioid use disorder, severe, dependence (HCC) 02/24/2015  . Hypertension 08/02/2014  . Gastric reflux 08/02/2014    Past Surgical History:  Procedure Laterality Date  . KNEE SURGERY Left     Prior to Admission medications   Medication Sig Start Date End Date Taking? Authorizing Provider  amitriptyline (ELAVIL) 50 MG tablet Take 150 mg by mouth at bedtime. Reported on 07/25/2015. On Hold at Pharmacy    Historical Provider, MD  diazepam (VALIUM) 2 MG tablet Take 1 tablet (2 mg total) by mouth every 8 (eight) hours as needed for anxiety. 07/25/15 07/24/16  Rebecka ApleyAllison P Webster, MD  diphenhydrAMINE (BENADRYL) 25 mg capsule Take 1 capsule (25 mg total) by mouth every 4 (four) hours as  needed for itching or allergies. 04/15/16 04/15/17  Merrily BrittleNeil Rifenbark, MD  escitalopram (LEXAPRO) 20 MG tablet Take 1 tablet (20 mg total) by mouth daily. 06/23/15   Jimmy FootmanAndrea Hernandez-Gonzalez, MD  etodolac (LODINE) 200 MG capsule Take 1 capsule (200 mg total) by mouth every 8 (eight) hours. 07/25/15   Rebecka ApleyAllison P Webster, MD  famotidine (PEPCID) 40 MG tablet Take 1 tablet (40 mg total) by mouth 2 (two) times daily. 04/15/16 04/20/16  Merrily BrittleNeil Rifenbark, MD  gabapentin (NEURONTIN) 300 MG capsule Take 600 mg by mouth 3 (three) times daily.    Historical Provider, MD  lidocaine (LIDODERM) 5 % Place 1 patch onto the skin every 12 (twelve) hours. Remove & Discard patch within 12 hours or as directed by MD 07/25/15 07/24/16  Rebecka ApleyAllison P Webster, MD  lurasidone (LATUDA) 80 MG TABS tablet Take 1 tablet (80 mg total) by mouth daily with supper. 06/23/15   Jimmy FootmanAndrea Hernandez-Gonzalez, MD  oxyCODONE-acetaminophen (PERCOCET) 10-325 MG tablet Take 1 tablet by mouth every 6 (six) hours as needed for pain. 04/15/16   Merrily BrittleNeil Rifenbark, MD  pregabalin (LYRICA) 150 MG capsule Take 150 mg by mouth 3 (three) times daily.    Historical Provider, MD  traZODone (DESYREL) 150 MG tablet Take 1 tablet (150 mg total) by mouth at bedtime. 06/23/15   Jimmy FootmanAndrea Hernandez-Gonzalez, MD    Allergies Hydrocodone-acetaminophen and Suboxone [buprenorphine hcl-naloxone hcl]  Family History  Problem Relation Age of Onset  .       Social History Social History  Substance Use Topics  . Smoking status: Current Every Day Smoker  . Smokeless tobacco: Never Used  . Alcohol use No     Review of Systems  Constitutional: No fever/chills ENT: No upper respiratory complaints. Cardiovascular: No chest pain. Respiratory: No cough. No SOB. Gastrointestinal: No abdominal pain.  No nausea, no vomiting.  Musculoskeletal: Negative for musculoskeletal pain. Skin: Negative for rash, abrasions, lacerations, ecchymosis. Neurological: Negative for headaches, numbness  or tingling   ____________________________________________   PHYSICAL EXAM:  VITAL SIGNS: ED Triage Vitals  Enc Vitals Group     BP 04/22/16 0912 (!) 145/98     Pulse Rate 04/22/16 0912 (!) 18     Resp 04/22/16 0912 18     Temp 04/22/16 0912 98 F (36.7 C)     Temp Source 04/22/16 0912 Oral     SpO2 04/22/16 0912 100 %     Weight 04/22/16 0913 300 lb (136.1 kg)     Height 04/22/16 0913 6' (1.829 m)     Head Circumference --      Peak Flow --      Pain Score 04/22/16 0913 9     Pain Loc --      Pain Edu? --      Excl. in GC? --      Constitutional: Alert and oriented. Well appearing and in no acute distress. Eyes: Conjunctivae are normal. PERRL. EOMI. Head: Atraumatic. ENT:      Ears:      Nose: No congestion/rhinnorhea.      Mouth/Throat: Mucous membranes are moist.  Neck: No stridor.   Cardiovascular: Normal rate, regular rhythm.  Good peripheral circulation. Respiratory: Normal respiratory effort without tachypnea or retractions. Lungs CTAB. Good air entry to the bases with no decreased or absent breath sounds. Gastrointestinal: Bowel sounds 4 quadrants. Soft and nontender to palpation.  Genitourinary: Foley in place. Urine clear and yellow. No blood.  Musculoskeletal: Full range of motion to all extremities. No gross deformities appreciated. Neurologic:  Normal speech and language. No gross focal neurologic deficits are appreciated.  Skin:  Skin is warm, dry and intact. No rash noted.   ____________________________________________   LABS (all labs ordered are listed, but only abnormal results are displayed)  Labs Reviewed - No data to display ____________________________________________  EKG   ____________________________________________  RADIOLOGY   No results found.  ____________________________________________    PROCEDURES  Procedure(s) performed:    Procedures    Medications - No data to  display   ____________________________________________   INITIAL IMPRESSION / ASSESSMENT AND PLAN / ED COURSE  Pertinent labs & imaging results that were available during my care of the patient were reviewed by me and considered in my medical decision making (see chart for details).  Review of the [place] CSRS was performed in accordance of the [facility] prior to dispensing any controlled drugs.     Patient presented to the emergency department for Foley removal. Vital signs and exam are reassuring. Foley was removed in ED and patient successfully urinated after removal. Patient is to follow up with PCP as directed. Patient is given ED precautions to return to the ED for any worsening or new symptoms.     ____________________________________________  FINAL CLINICAL IMPRESSION(S) / ED DIAGNOSES  Final diagnoses:  Encounter for Foley catheter removal      NEW MEDICATIONS STARTED DURING THIS VISIT:  Discharge Medication List as of 04/22/2016  1:09 PM          This chart was dictated  using voice recognition software/Dragon. Despite best efforts to proofread, errors can occur which can change the meaning. Any change was purely unintentional.    Enid Derry, PA-C 04/22/16 1736    Sharman Cheek, MD 04/27/16 1110

## 2016-04-22 NOTE — ED Triage Notes (Signed)
Pt states he had a catheter placed last Wednesday due to an allergic reaction to suboxone . Pt did not follow up with urology and has attempted to removed it himself.

## 2016-04-22 NOTE — ED Notes (Signed)
NAD noted at time of D/C. Pt denies questions or concerns. Pt ambulatory to the lobby at this time. PA made aware of patient's HR. States ok for D/C.

## 2016-04-22 NOTE — ED Notes (Signed)
Pt presents to ED, states was sent to ER last week for evaluation of a reaction to suboxone. Pt states foley was placed due to inability to pee. Pt reports was supposed to follow up with urology yet has not, and reports that he has been trying to "pull it out" without success.

## 2016-10-19 ENCOUNTER — Encounter: Payer: Self-pay | Admitting: Student in an Organized Health Care Education/Training Program

## 2016-10-19 ENCOUNTER — Ambulatory Visit
Payer: Medicaid Other | Attending: Student in an Organized Health Care Education/Training Program | Admitting: Student in an Organized Health Care Education/Training Program

## 2016-10-19 VITALS — BP 149/92 | HR 112 | Temp 98.2°F | Resp 20 | Ht 72.0 in | Wt 320.0 lb

## 2016-10-19 DIAGNOSIS — M25512 Pain in left shoulder: Secondary | ICD-10-CM | POA: Diagnosis present

## 2016-10-19 DIAGNOSIS — M48061 Spinal stenosis, lumbar region without neurogenic claudication: Secondary | ICD-10-CM | POA: Diagnosis not present

## 2016-10-19 DIAGNOSIS — G894 Chronic pain syndrome: Secondary | ICD-10-CM | POA: Diagnosis not present

## 2016-10-19 DIAGNOSIS — N189 Chronic kidney disease, unspecified: Secondary | ICD-10-CM | POA: Diagnosis not present

## 2016-10-19 DIAGNOSIS — Z79899 Other long term (current) drug therapy: Secondary | ICD-10-CM | POA: Diagnosis not present

## 2016-10-19 DIAGNOSIS — M25562 Pain in left knee: Secondary | ICD-10-CM | POA: Insufficient documentation

## 2016-10-19 DIAGNOSIS — K219 Gastro-esophageal reflux disease without esophagitis: Secondary | ICD-10-CM | POA: Insufficient documentation

## 2016-10-19 DIAGNOSIS — M5116 Intervertebral disc disorders with radiculopathy, lumbar region: Secondary | ICD-10-CM | POA: Diagnosis not present

## 2016-10-19 DIAGNOSIS — R200 Anesthesia of skin: Secondary | ICD-10-CM | POA: Insufficient documentation

## 2016-10-19 DIAGNOSIS — M4854XA Collapsed vertebra, not elsewhere classified, thoracic region, initial encounter for fracture: Secondary | ICD-10-CM | POA: Diagnosis not present

## 2016-10-19 DIAGNOSIS — F1410 Cocaine abuse, uncomplicated: Secondary | ICD-10-CM | POA: Diagnosis not present

## 2016-10-19 DIAGNOSIS — Z8489 Family history of other specified conditions: Secondary | ICD-10-CM | POA: Insufficient documentation

## 2016-10-19 DIAGNOSIS — F331 Major depressive disorder, recurrent, moderate: Secondary | ICD-10-CM

## 2016-10-19 DIAGNOSIS — M4696 Unspecified inflammatory spondylopathy, lumbar region: Secondary | ICD-10-CM

## 2016-10-19 DIAGNOSIS — W19XXXA Unspecified fall, initial encounter: Secondary | ICD-10-CM | POA: Diagnosis not present

## 2016-10-19 DIAGNOSIS — Z720 Tobacco use: Secondary | ICD-10-CM | POA: Diagnosis not present

## 2016-10-19 DIAGNOSIS — E041 Nontoxic single thyroid nodule: Secondary | ICD-10-CM | POA: Insufficient documentation

## 2016-10-19 DIAGNOSIS — R1030 Lower abdominal pain, unspecified: Secondary | ICD-10-CM | POA: Diagnosis not present

## 2016-10-19 DIAGNOSIS — M5136 Other intervertebral disc degeneration, lumbar region: Secondary | ICD-10-CM | POA: Diagnosis not present

## 2016-10-19 DIAGNOSIS — F1120 Opioid dependence, uncomplicated: Secondary | ICD-10-CM

## 2016-10-19 DIAGNOSIS — F419 Anxiety disorder, unspecified: Secondary | ICD-10-CM | POA: Insufficient documentation

## 2016-10-19 DIAGNOSIS — M545 Low back pain: Secondary | ICD-10-CM | POA: Diagnosis present

## 2016-10-19 DIAGNOSIS — M4802 Spinal stenosis, cervical region: Secondary | ICD-10-CM | POA: Insufficient documentation

## 2016-10-19 DIAGNOSIS — M51369 Other intervertebral disc degeneration, lumbar region without mention of lumbar back pain or lower extremity pain: Secondary | ICD-10-CM

## 2016-10-19 DIAGNOSIS — I129 Hypertensive chronic kidney disease with stage 1 through stage 4 chronic kidney disease, or unspecified chronic kidney disease: Secondary | ICD-10-CM | POA: Diagnosis not present

## 2016-10-19 DIAGNOSIS — F1420 Cocaine dependence, uncomplicated: Secondary | ICD-10-CM | POA: Diagnosis not present

## 2016-10-19 DIAGNOSIS — F1110 Opioid abuse, uncomplicated: Secondary | ICD-10-CM | POA: Insufficient documentation

## 2016-10-19 DIAGNOSIS — Y9301 Activity, walking, marching and hiking: Secondary | ICD-10-CM | POA: Insufficient documentation

## 2016-10-19 DIAGNOSIS — R45851 Suicidal ideations: Secondary | ICD-10-CM | POA: Diagnosis not present

## 2016-10-19 DIAGNOSIS — M47816 Spondylosis without myelopathy or radiculopathy, lumbar region: Secondary | ICD-10-CM

## 2016-10-19 MED ORDER — TIZANIDINE HCL 4 MG PO CAPS
4.0000 mg | ORAL_CAPSULE | Freq: Three times a day (TID) | ORAL | 1 refills | Status: DC | PRN
Start: 2016-10-19 — End: 2017-03-15

## 2016-10-19 MED ORDER — DICLOFENAC SODIUM 1 % TD GEL: 4.0000 g | Freq: Four times a day (QID) | TRANSDERMAL | 2 refills | Status: AC

## 2016-10-19 NOTE — Progress Notes (Signed)
Safety precautions to be maintained throughout the outpatient stay will include: orient to surroundings, keep bed in low position, maintain call bell within reach at all times, provide assistance with transfer out of bed and ambulation.  

## 2016-10-19 NOTE — Progress Notes (Signed)
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 deformity of T12. There is no listhesis. The posterior elements are intact. Disc spaces are preserved. Multilevel endplate spurring. No fracture. Sacroiliac joints are symmetric.  IMPRESSION: 1. No acute fracture or subluxation of the lumbar spine. 2. Stable chronic T12 compression fracture.   Electronically Signed   By: Jeb Levering M.D.   On: 07/25/2015 02:26     Lumbar DG Myelogram views:  Results for orders placed during the hospital encounter of 05/08/14  DG Myelogram Lumbar   Narrative CLINICAL DATA:  51 year old male with lumbar back pain radiating down the posterior aspect of right lower extremity to the foot. Numbness in all toes. Subsequent encounter.  EXAM: LUMBAR MYELOGRAM  FLUOROSCOPY TIME:  0 minutes 48 seconds  PROCEDURE: Lumbar puncture and  intrathecal contrast administration were performed by Dr. Marylyn Ishihara CABBELL who will separately report for the portion of the procedure. I personally supervised acquisition of the myelogram images.  I personally supervised acquisition of the myelogram images.  TECHNIQUE: Contiguous axial images were obtained through the Lumbar spine after the intrathecal infusion of infusion. Coronal and sagittal reconstructions were obtained of the axial image sets.  COMPARISON:  [facility] lumbar MRI 01/17/2014, and earlier. CT Abdomen and Pelvis 08/11/2013.  FINDINGS: LUMBAR MYELOGRAM FINDINGS:  Normal lumbar segmentation, which is the same numbering system utilized on priors. Good intrathecal contrast opacification. Bilateral lateral recess narrowing at L4-L5. Ventral defect home thecal sac at that level.  Chronic T12 compression fracture.  Upright views. Progressed ventral defect at L4-L5, and mild lumbar ventral defects elsewhere (otherwise notable at L1-L2) when standing. Findings abates with flexion. There is trace anterolisthesis at L4-L5 when upright, which may progress with flexion. It appears stable in extension.  CT LUMBAR MYELOGRAM FINDINGS:  Stable vertebral height and alignment. Chronic T12 compression fracture. Visible sacral ala and SI joints intact. No acute osseous abnormality identified.  Aortoiliac calcified atherosclerosis noted. Otherwise negative visualized abdominal viscera.  Normal myelographic appearance of the lower thoracic spinal cord. Conus medullaris at L1-L2.  T11-T12: Mild disc osteophyte complex in part due to chronic compression fracture. Moderate facet hypertrophy. No significant stenosis.  T12-L1:  Negative.  L1-L2:  Mild disc bulge.  No stenosis.  L2-L3: Mild facet hypertrophy. Borderline to mild disc bulge. No stenosis.  L3-L4: Mild retrolisthesis. Mild disc bulge. Mild to moderate facet hypertrophy greater on the left. No  significant stenosis.  L4-L5: Circumferential mostly far lateral disc osteophyte complex. Severe facet hypertrophy with vacuum facet phenomena. Moderate to severe ligament flavum hypertrophy. Mild spinal and left greater than right lateral recess stenosis. Borderline to mild L4 foraminal stenosis.  L5-S1: Partially calcified right paracentral disc protrusion. Minimal mass effect on the ventral thecal sac. Suspect conjoined right L5 and S1 nerve roots. Right lateral recess stenosis better demonstrated on the comparison MRI when combined with moderate facet hypertrophy greater on the right. No spinal stenosis. Mild to moderate right L5 foraminal stenosis, in part due to facet spurring.  IMPRESSION: LUMBAR MYELOGRAM IMPRESSION:  1. Suggestion of instability at L4-L5, with Trace anterolisthesis and mild motion in flexion extension. 2. Evidence of bilateral L4-L5 lateral recess stenosis. 3. Chronic T12 compression fracture.  CT LUMBAR MYELOGRAM IMPRESSION:  1. Severe facet arthropathy at L4-L5 contributing to multifactorial mild spinal, lateral recess, and foraminal stenosis. 2. Multifactorial right lateral recess stenosis at L5-S1 where conjoined right L5 and S1 nerve roots are suspected. Query right S1 radiculitis. Mild to moderate right L5 foraminal stenosis.   Electronically Signed   By: Genevie Ann M.D.   On: 05/08/2014 11:24      Knee-R DG 4 views:  Results for orders placed in visit on 08/14/11  DG Knee Complete 4 Views Right   Narrative  PRIOR REPORT IMPORTED FROM THE [facility] WORKFLOW SYSTEM   REASON FOR EXAM:    pain following trauma  COMMENTS:   PROCEDURE:     DXR - DXR KNEE RT COMP WITH OBLIQUES  - Aug 14 2011  4:25PM   RESULT:     Four views of the right knee are submitted. There is minimal  beaking of the tibial spines. The joint compartments are preserved in  width.  I see no joint effusion. There is no evidence of an acute fracture nor  dislocation.    IMPRESSION:      I see no acute abnormality of the right knee.   Dictation Site: 5        Complexity Note: Imaging results reviewed. Results discussed using Layman's terms.               ROS  Cardiovascular History: No reported cardiovascular signs or symptoms such as High blood pressure, coronary artery disease, abnormal heart rate or rhythm, heart attack, blood thinner therapy or heart weakness and/or failure Pulmonary  or Respiratory History: Snoring  Neurological History: No reported neurological signs or symptoms such as seizures, abnormal skin sensations, urinary and/or fecal incontinence, being born with an abnormal open spine and/or a tethered spinal cord Review of Past Neurological Studies:  Results for orders placed or performed during the hospital encounter of 07/25/15  CT Head Wo Contrast   Narrative   CLINICAL DATA:  [place] Posterior neck pain and LEFT parietal pain. History of hypertension and opioid abuse.  EXAM: CT HEAD WITHOUT CONTRAST  CT CERVICAL SPINE WITHOUT CONTRAST  TECHNIQUE: Multidetector CT imaging of the head and cervical spine was performed following the standard protocol without intravenous contrast. Multiplanar CT image reconstructions of the cervical spine were also generated.  COMPARISON:  CT HEAD and cervical spine August 03, 2012  FINDINGS: CT HEAD FINDINGS  INTRACRANIAL CONTENTS: Mild ventriculomegaly, cavum septum pellucidum et vergae, proportional global parenchymal brain volume loss. No intraparenchymal hemorrhage, mass effect nor midline shift. No acute large vascular territory infarcts. No abnormal extra-axial fluid collections. Basal cisterns are patent.  ORBITS: The included ocular globes and orbital contents are normal.  SINUSES: The mastoid aircells and included paranasal sinuses are well-aerated.  SKULL/SOFT TISSUES: No skull fracture. No significant soft tissue swelling. Patient is edentulous.  CT  CERVICAL SPINE FINDINGS  OSSEOUS STRUCTURES: Cervical vertebral bodies and posterior elements are intact and aligned with maintenance of the cervical lordosis. Intervertebral disc heights preserved. No destructive bony lesions. C1-2 articulation maintained with mild arthropathy. Multilevel mild ventral endplate spurring, unchanged. Old LEFT clavicle fracture partially imaged. Old LEFT rib fractures.  SOFT TISSUES: Included prevertebral and paraspinal soft tissues are nonacute. Subcentimeter intra parotid lymph nodes. 23 x 15 mm RIGHT thyroid nodule, approximately 19 x 15 mm on prior CT.  IMPRESSION: CT HEAD: No acute intracranial process.  Mild global parenchymal brain volume loss for age.  CT CERVICAL SPINE: No acute fracture or malalignment.  22 x 15 mm RIGHT thyroid nodule. Recommend follow-up thyroid sonogram on a nonemergent basis.   Electronically Signed   By: Elon Alas M.D.   On: 07/25/2015 02:43    Psychological-Psychiatric History: Anxiousness and Depressed Gastrointestinal History: Reflux or heatburn and Irregular, infrequent bowel movements (Constipation) Genitourinary History: No reported renal or genitourinary signs or symptoms such as difficulty voiding or producing urine, peeing blood, non-functioning kidney, kidney stones, difficulty emptying the bladder, difficulty controlling the flow of urine, or chronic kidney disease Hematological History: No reported hematological signs or symptoms such as prolonged bleeding, low or poor functioning platelets, bruising or bleeding easily, hereditary bleeding problems, low energy levels due to low hemoglobin or being anemic Endocrine History: No reported endocrine signs or symptoms such as high or low blood sugar, rapid heart rate due to high thyroid levels, obesity or weight gain due to slow thyroid or thyroid disease Rheumatologic History: No reported rheumatological signs and symptoms such as fatigue, joint pain,  tenderness, swelling, redness, heat, stiffness, decreased range of motion, with or without associated rash Musculoskeletal History: Negative for myasthenia gravis, muscular dystrophy, multiple sclerosis or malignant hyperthermia Work History: Disabled  Allergies  Mr. Sargent is allergic to hydrocodone-acetaminophen and suboxone [buprenorphine hcl-naloxone hcl].  Laboratory Chemistry  Inflammation Markers (CRP: Acute Phase) (ESR: Chronic Phase) No results found for: CRP, ESRSEDRATE               Renal Function Markers Lab Results  Component Value Date   BUN 15 04/15/2016   CREATININE 0.89 04/15/2016   GFRAA >60 04/15/2016  GFRNONAA >60 04/15/2016                 Hepatic Function Markers Lab Results  Component Value Date   AST 40 04/15/2016   ALT 60 04/15/2016   ALBUMIN 4.1 04/15/2016   ALKPHOS 75 04/15/2016                 Electrolytes Lab Results  Component Value Date   NA 131 (L) 04/15/2016   K 3.5 04/15/2016   CL 94 (L) 04/15/2016   CALCIUM 8.4 (L) 04/15/2016   MG 1.9 02/16/2015                 Neuropathy Markers No results found for: [id number]               Bone Pathology Markers Lab Results  Component Value Date   ALKPHOS 75 04/15/2016   CALCIUM 8.4 (L) 04/15/2016                 Coagulation Parameters Lab Results  Component Value Date   INR 1.1 08/04/2012   LABPROT 14.8 (H) 08/04/2012   APTT 31.8 08/04/2012   PLT 220 04/15/2016                 Cardiovascular Markers Lab Results  Component Value Date   BNP 92 08/14/2011   HGB 13.4 04/15/2016   HCT 39.2 (L) 04/15/2016                 Note: Lab results reviewed.  [place]  Drug: Mr. Wuertz  reports that he does not use drugs. Alcohol:  reports that he does not drink alcohol. Tobacco:  reports that he has quit smoking. He has never used smokeless tobacco. Medical:  has a past medical history of Anxiety; Chronic pain; Hypertension; Opioid abuse; and Urinary retention. Family: family history  includes Alcohol abuse in his father.  Past Surgical History:  Procedure Laterality Date  . BACK SURGERY    . KNEE SURGERY Left    Active Ambulatory Problems    Diagnosis Date Noted  . Hypertension 08/02/2014  . Gastric reflux 08/02/2014  . Chronic pain 02/24/2015  . Opioid use disorder, severe, dependence ([place]) 02/24/2015  . Major depression 06/20/2015  . Cocaine use disorder, moderate, dependence ([place]) 06/20/2015  . Opioid use with withdrawal ([place]) 06/20/2015  . Tobacco use disorder 06/20/2015   Resolved Ambulatory Problems    Diagnosis Date Noted  . No Resolved Ambulatory Problems   Past Medical History:  Diagnosis Date  . Anxiety   . Chronic pain   . Hypertension   . Opioid abuse   . Urinary retention    Constitutional Exam  General appearance: Well nourished, well developed, and well hydrated. In no apparent acute distress Vitals:   10/19/16 0803  BP: (!) 149/92  Pulse: (!) 112  Resp: 20  Temp: 98.2 F (36.8 C)  SpO2: 97%  Weight: (!) 320 lb (145.2 kg)  Height: 6' (1.829 m)   BMI Assessment: Estimated body mass index is 43.4 kg/m as calculated from the following:   Height as of this encounter: 6' (1.829 m).   Weight as of this encounter: 320 lb (145.2 kg).  BMI interpretation table: BMI level Category Range association with higher incidence of chronic pain  <18 kg/m2 Underweight   18.5-24.9 kg/m2 Ideal body weight   25-29.9 kg/m2 Overweight Increased incidence by 20%  30-34.9 kg/m2 Obese (Class I) Increased incidence by 68%  35-39.9 kg/m2 Severe obesity (Class II)  Increased incidence by 136%  >40 kg/m2 Extreme obesity (Class III) Increased incidence by 254%   BMI Readings from Last 4 Encounters:  10/19/16 43.40 kg/m  04/22/16 40.69 kg/m  04/15/16 43.40 kg/m  07/25/15 35.26 kg/m   Wt Readings from Last 4 Encounters:  10/19/16 (!) 320 lb (145.2 kg)  04/22/16 300 lb (136.1 kg)  04/15/16 (!) 320 lb (145.2 kg)  07/25/15 260 lb (117.9 kg)   Psych/Mental status: Alert, oriented x 3 (person, place, & time)       Eyes: PERLA Respiratory: No evidence of acute respiratory distress  Cervical Spine Area Exam  Skin & Axial Inspection: No masses, redness, edema, swelling, or associated skin lesions Alignment: Symmetrical Functional ROM: Unrestricted ROM      Stability: No instability detected Muscle Tone/Strength: Functionally intact. No obvious neuro-muscular anomalies detected. Sensory (Neurological): Unimpaired Palpation: No palpable anomalies              Upper Extremity (UE) Exam    Side: Right upper extremity  Side: Left upper extremity  Skin & Extremity Inspection: Skin color, temperature, and hair growth are WNL. No peripheral edema or cyanosis. No masses, redness, swelling, asymmetry, or associated skin lesions. No contractures.  Skin & Extremity Inspection: Skin color, temperature, and hair growth are WNL. No peripheral edema or cyanosis. No masses, redness, swelling, asymmetry, or associated skin lesions. No contractures.  Functional ROM: Unrestricted ROM          Functional ROM: Unrestricted ROM          Muscle Tone/Strength: Functionally intact. No obvious neuro-muscular anomalies detected.  Muscle Tone/Strength: Functionally intact. No obvious neuro-muscular anomalies detected.  Sensory (Neurological): Unimpaired  Sensory (Neurological): Unimpaired  Palpation: No palpable anomalies              Palpation: No palpable anomalies              Specialized Test(s): Deferred         Specialized Test(s): Deferred          Thoracic Spine Area Exam  Skin & Axial Inspection: No masses, redness, or swelling Alignment: Symmetrical Functional ROM: Unrestricted ROM Stability: No instability detected Muscle Tone/Strength: Functionally intact. No obvious neuro-muscular anomalies detected. Sensory (Neurological): Unimpaired Muscle strength & Tone: No palpable anomalies  Lumbar Spine Area Exam  Skin & Axial Inspection: No masses,  redness, or swelling Alignment: Asymmetric Functional ROM: Decreased ROM      Stability: No instability detected Muscle Tone/Strength: Functionally intact. No obvious neuro-muscular anomalies detected. Sensory (Neurological): Movement-associated pain Palpation: Complains of area being tender to palpation       Provocative Tests: Lumbar Hyperextension and rotation test: Positive       Lumbar Lateral bending test: Positive       Patrick's Maneuver: evaluation deferred today                    Gait & Posture Assessment  Ambulation: Limited Gait: Relatively normal for age and body habitus Posture: WNL   Lower Extremity Exam    Side: Right lower extremity  Side: Left lower extremity  Skin & Extremity Inspection: Skin color, temperature, and hair growth are WNL. No peripheral edema or cyanosis. No masses, redness, swelling, asymmetry, or associated skin lesions. No contractures.  Skin & Extremity Inspection: Skin color, temperature, and hair growth are WNL. No peripheral edema or cyanosis. No masses, redness, swelling, asymmetry, or associated skin lesions. No contractures.  Functional ROM: Decreased ROM  Functional ROM: Decreased ROM          Muscle Tone/Strength: Functionally intact. No obvious neuro-muscular anomalies detected.  Muscle Tone/Strength: Functionally intact. No obvious neuro-muscular anomalies detected.  Sensory (Neurological): Unimpaired  Sensory (Neurological): Unimpaired  Palpation: No palpable anomalies  Palpation: No palpable anomalies   Assessment  Primary Diagnosis & Pertinent Problem List: The primary encounter diagnosis was Chronic pain syndrome. Diagnoses of Opioid use disorder, severe, dependence (HCC), Moderate episode of recurrent major depressive disorder ([place]), Cocaine use disorder, moderate, dependence ([place]), Lumbar facet arthropathy ([place]), Morbid obesity ([place]), and DDD (degenerative disc disease), lumbar were also pertinent to this visit.  Visit  Diagnosis (New problems to examiner): 1. Chronic pain syndrome   2. Opioid use disorder, severe, dependence ([place])   3. Moderate episode of recurrent major depressive disorder ([place])   4. Cocaine use disorder, moderate, dependence ([place])   5. Lumbar facet arthropathy (HCC)   6. Morbid obesity ([place])   7. DDD (degenerative disc disease), lumbar    Plan of Care (Initial workup plan)  Note: NOT AN OPIOID CANDIDATE AT [facility]  Patient is a 51 year old male with a past medical history of hypertension, major depression, bipolar 1 disorder, history of suicidal ideation, history of cocaine abuse who presents for evaluation regarding his chronic low back pain, leg. Patient's CT of his lumbar spine shows severe facet arthropathy most pronounced at L4-L5 and L5-S1. Patient also has bilateral L4-L5 lateral recess stenosis and a chronic T12 compression fracture that could be contributing to his chronic back pain.  Patient does have morbid obesity and his pain experience is affected by his psychological overlay which includes severe depression and anxiety. Patient also has a history of substance abuse disorder including cocaine and has been urine tox positive on multiple occasions for cocaine.  Patient is not a candidate for opioid therapy at our clinic given his risk of substance abuse, morbid obesity concern for significant productive sleep apnea, and psychological disease with a documented ED visit concerning for suicidal ideation. Patient has severe psychological disease including major depression and anxiety (history of suicidal ideation with admission to the ED recently), difficult social situation, history of cocaine abuse with documented positive urine tox screens making this patient high risk.   Plan: #1: Patient is high risk for opioid prescribing. The patient will not be a candidate for opioid therapy through this clinic. Patient will be optimized on non-opioid adjunctive analgesics. #2: Tizanidine  4 mg 3 times a day when necessary back spasm #3. Voltaren gel the patient can apply to low back   #4. Recommend patient try to exercise. Counseled on healthy eating, exercises that he can do that are low impact on his joints and stretches that he can employ as well.  Ordered Lab-work, Procedure(s), Referral(s), & Consult(s): No orders of the defined types were placed in this encounter.  Pharmacotherapy (current): Medications ordered:  Meds ordered this encounter  Medications  . diclofenac sodium (VOLTAREN) 1 % GEL    Sig: Apply 4 g topically 4 (four) times daily.    Dispense:  100 g    Refill:  2    Do not add this medication to the electronic "Automatic Refill" notification system. Patient may have prescription filled one day early if pharmacy is closed on scheduled refill date.  Marland Kitchen tiZANidine (ZANAFLEX) 4 MG capsule    Sig: Take 1 capsule (4 mg total) by mouth 3 (three) times daily as needed for muscle spasms.    Dispense:  90  capsule    Refill:  1    Do not place this medication, or any other prescription from our practice, on "Automatic Refill". Patient may have prescription filled one day early if pharmacy is closed on scheduled refill date.   Medications administered during this visit: Mr. Hudgins had no medications administered during this visit.   Pharmacological management options:  Opioid Analgesics: Not a candidate at our clinic   Membrane stabilizer: To be determined at a later time currently on gabapentin 600 mg 3 times a day. Has tried Lyrica in the past but states that gabapentin is more helpful. Can consider Topamax.   Muscle relaxant: To be determined at a later time has tried Flexeril with some relief. We'll trial tizanidine. Can consider baclofen or Robaxin in the future.   NSAID: To be determined at a later time takes ibuprofen. Can consider short trial of diclofenac oral or meloxicam oral.   Other analgesic(s): To be determined at a later time patient on many  psychotropic medications including amitriptyline 200 mg daily at bedtime. Would not recommend SSRI unless patient were to come off of some of his other serotonergic medications.    Interventional management options: Mr. Hayhurst was informed that there is no guarantee that he would be a candidate for interventional therapies. The decision will be based on the results of diagnostic studies, as well as Mr. Craighead risk profile.  Procedure(s) under consideration:  -Patient has tried lumbar facets and medial branch blocks which were not effective.    Provider-requested follow-up: Return in about 2 months (around 12/19/2016) for Medication Management w/ [place].  Future Appointments Date Time Provider [place]  12/20/2016 8:45 AM Vevelyn Francois, NP [facility] None    Primary Care Physician: [place] Location: [facility] Note by: Gillis Santa, M.D, Date: 10/19/2016; Time: 9:09 AM  Patient Instructions  1. Tizanidine 4 mg TID prn muscle spasms 2. Voltaren gel to low back region 3. Recommend physical therapy

## 2016-10-19 NOTE — Patient Instructions (Addendum)
1. Tizanidine 4 mg TID prn muscle spasms 2. Voltaren gel to low back region 3. Recommend physical therapy

## 2016-11-29 ENCOUNTER — Other Ambulatory Visit: Payer: Self-pay | Admitting: Student in an Organized Health Care Education/Training Program

## 2016-12-16 ENCOUNTER — Ambulatory Visit: Payer: Self-pay | Attending: Nurse Practitioner | Admitting: Nurse Practitioner

## 2016-12-20 ENCOUNTER — Ambulatory Visit: Payer: Medicaid Other | Admitting: Nurse Practitioner

## 2016-12-29 IMAGING — CT CT L SPINE W/ CM
3 of 6 series · 12 of 33 positions shown, 14 images · non-contrast
Comparison: [HOSPITAL] neurosurgery lumbar MRI 01/17/2014, and
earlier. CT Abdomen and Pelvis 08/11/2013.

CLINICAL DATA: 48-year-old male with lumbar back pain radiating
down the posterior aspect of right lower extremity to the foot.
Numbness in all toes. Subsequent encounter.
TECHNIQUE: Contiguous axial images were obtained through the Lumbar spine after
the intrathecal infusion of infusion. Coronal and sagittal
reconstructions were obtained of the axial image sets.

[Series 3: l spine 2.0 i30s 3 · axial · 0.38mm/px · z∈[+982,+1176]mm · 4 of 131 slices shown, 5 images]
[im 17/131  soft-tissue]
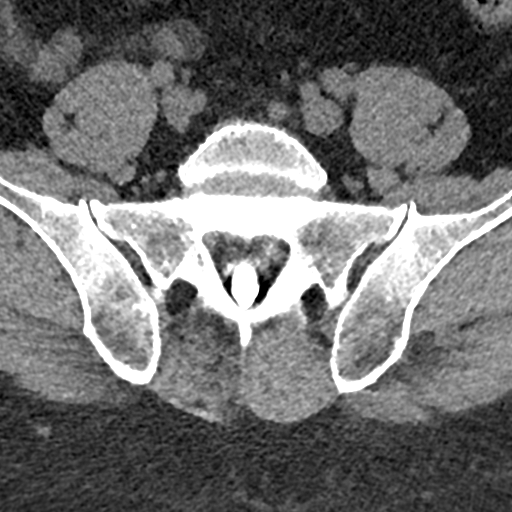
[im 17/131  bone]
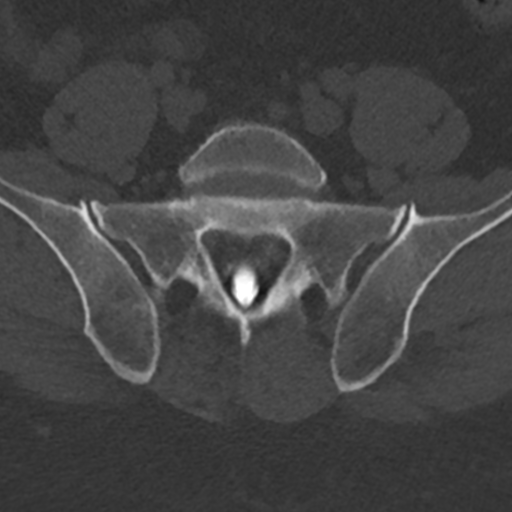
[im 49/131  bone]
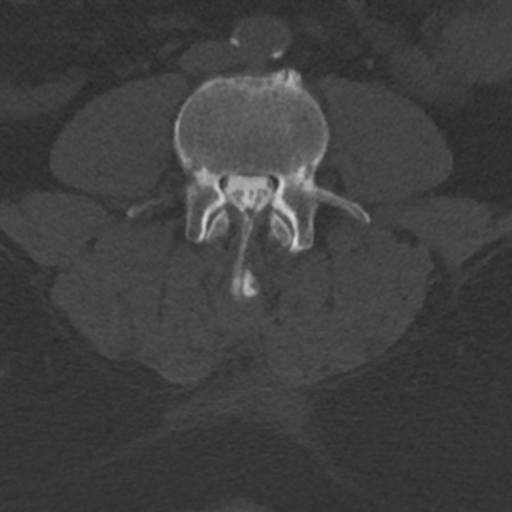
[im 82/131  bone]
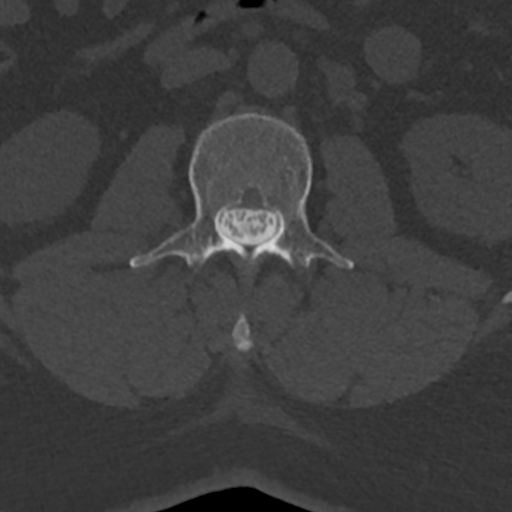
[im 114/131  bone]
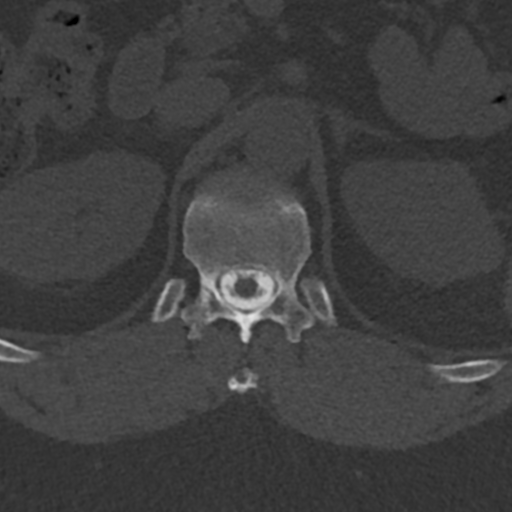

[Series 6: coronal st · coronal · 0.38mm/px · 3 of 69 slices shown]
[im 14/69  bone]
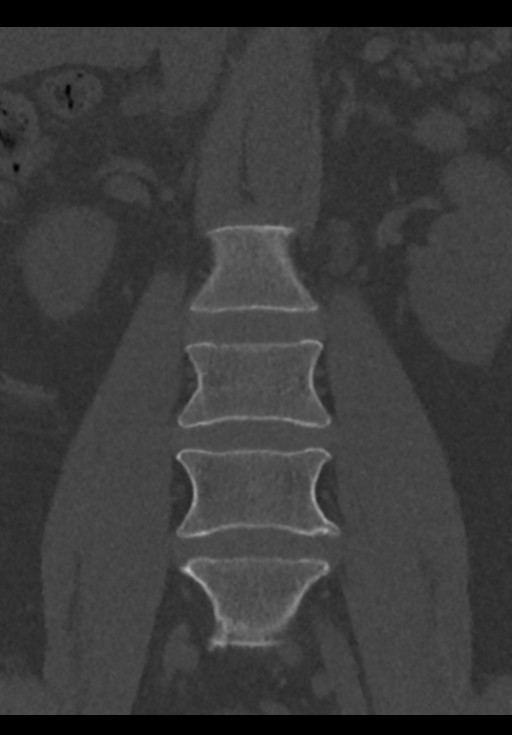
[im 28/69  bone]
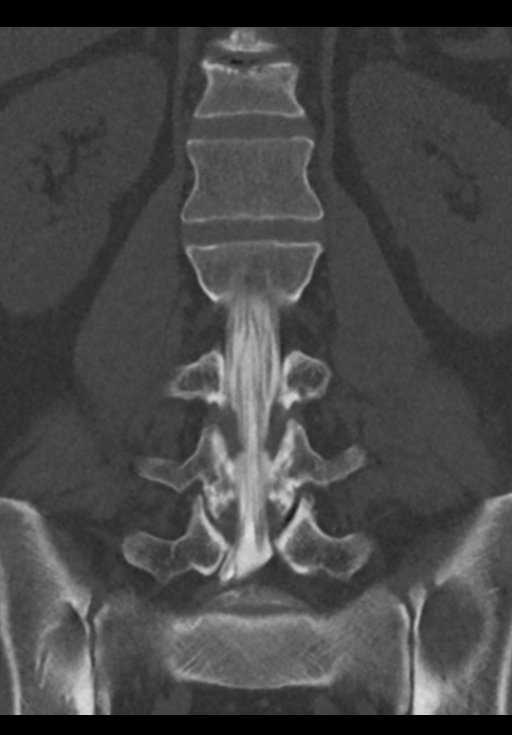
[im 41/69  bone]
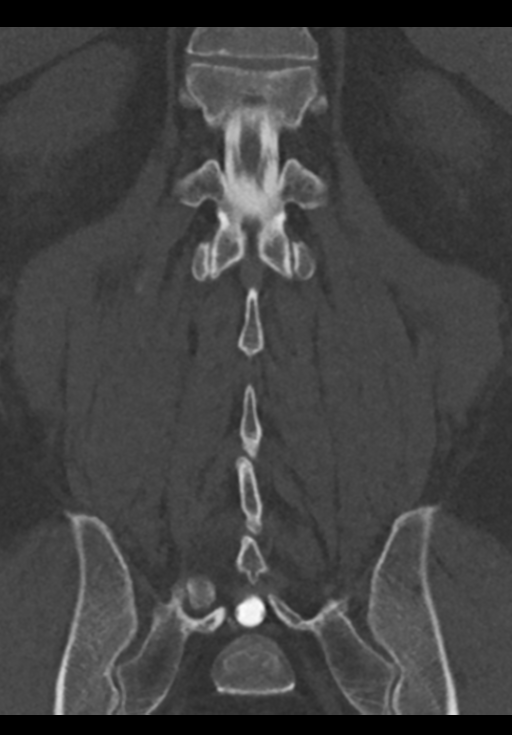

[Series 7: sagittal st · sagittal · 0.38mm/px · 5 of 66 slices shown, 6 images]
[im 22/66  bone]
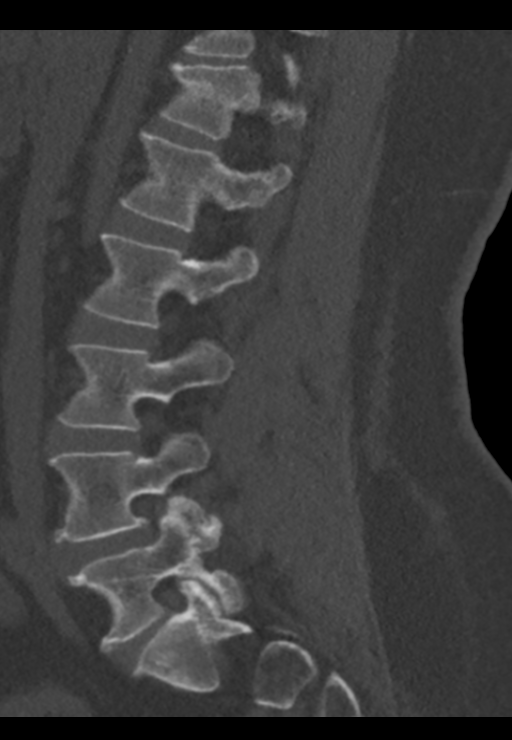
[im 28/66  bone]
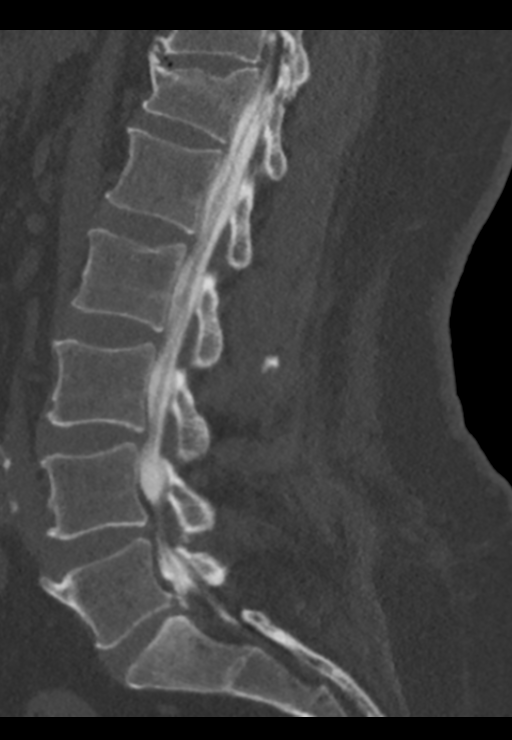
[im 33/66  soft-tissue]
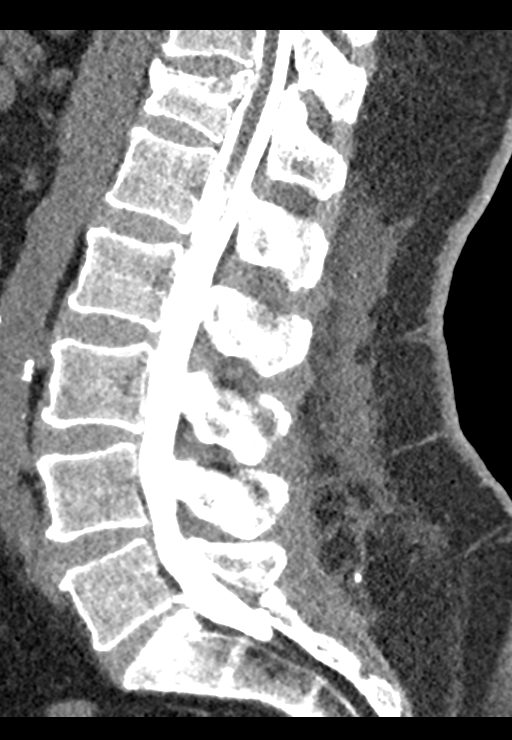
[im 33/66  bone]
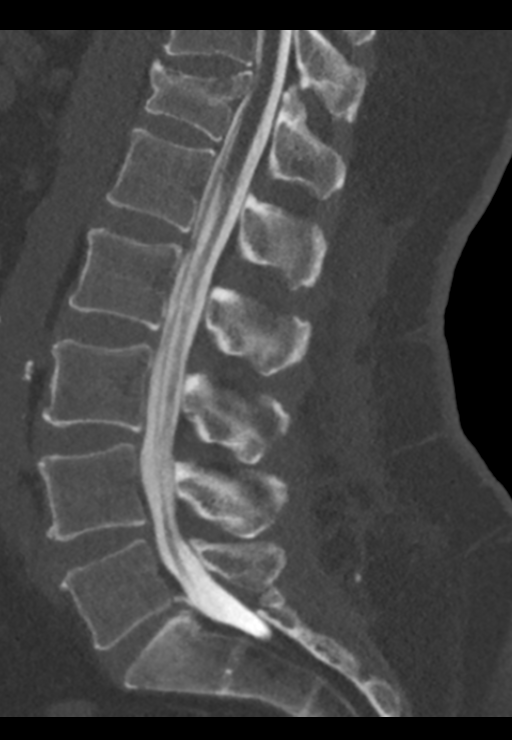
[im 38/66  bone]
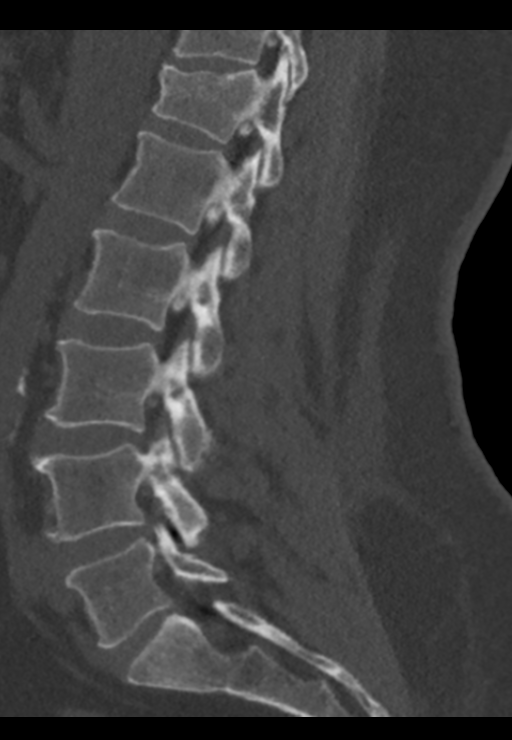
[im 44/66  bone]
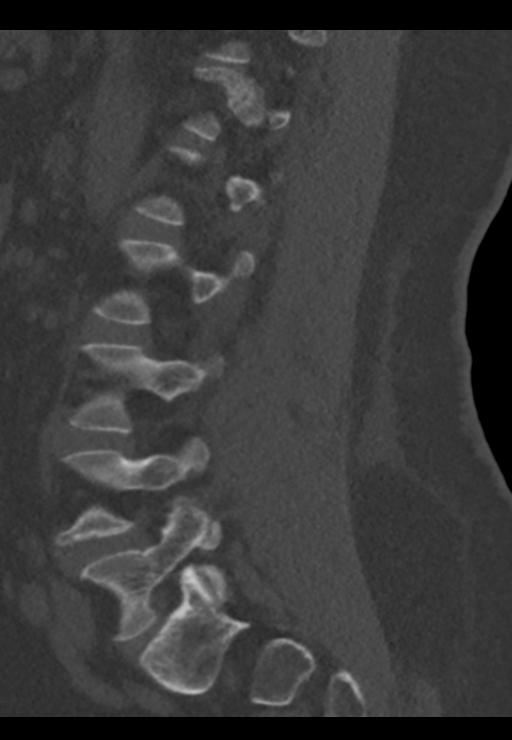

[12 of 33 positions shown; findings below may reference images not displayed]

EXAM:
LUMBAR MYELOGRAM

FLUOROSCOPY TIME:  0 minutes 48 seconds

PROCEDURE:
Lumbar puncture and intrathecal contrast administration were
performed by Dr. EIMHEAR NEE who will separately report for the
portion of the procedure. I personally supervised acquisition of the
myelogram images.

I personally supervised acquisition of the myelogram images.
FINDINGS: LUMBAR MYELOGRAM FINDINGS:

Normal lumbar segmentation, which is the same numbering system
utilized on priors. Good intrathecal contrast opacification.
Bilateral lateral recess narrowing at L4-L5. Ventral defect home
thecal sac at that level.

Chronic T12 compression fracture.

Upright views. Progressed ventral defect at L4-L5, and mild lumbar
ventral defects elsewhere (otherwise notable at L1-L2) when
standing. Findings abates with flexion. There is trace
anterolisthesis at L4-L5 when upright, which may progress with
flexion. It appears stable in extension.

CT LUMBAR MYELOGRAM FINDINGS:

Stable vertebral height and alignment. Chronic T12 compression
fracture. Visible sacral ala and SI joints intact. No acute osseous
abnormality identified.

Aortoiliac calcified atherosclerosis noted. Otherwise negative
visualized abdominal viscera.

Normal myelographic appearance of the lower thoracic spinal cord.
Conus medullaris at L1-L2.

T11-T12: Mild disc osteophyte complex in part due to chronic
compression fracture. Moderate facet hypertrophy. No significant
stenosis.

T12-L1:  Negative.

L1-L2:  Mild disc bulge.  No stenosis.

L2-L3: Mild facet hypertrophy. Borderline to mild disc bulge. No
stenosis.

L3-L4: Mild retrolisthesis. Mild disc bulge. Mild to moderate facet
hypertrophy greater on the left. No significant stenosis.

L4-L5: Circumferential mostly far lateral disc osteophyte complex.
Severe facet hypertrophy with vacuum facet phenomena. Moderate to
severe ligament flavum hypertrophy. Mild spinal and left greater
than right lateral recess stenosis. Borderline to mild L4 foraminal
stenosis.

L5-S1: Partially calcified right paracentral disc protrusion.
Minimal mass effect on the ventral thecal sac. Suspect conjoined
right L5 and S1 nerve roots. Right lateral recess stenosis better
demonstrated on the comparison MRI when combined with moderate facet
hypertrophy greater on the right. No spinal stenosis. Mild to
moderate right L5 foraminal stenosis, in part due to facet spurring.
IMPRESSION: LUMBAR MYELOGRAM IMPRESSION:

1. Suggestion of instability at L4-L5, with Trace anterolisthesis
and mild motion in flexion extension.
2. Evidence of bilateral L4-L5 lateral recess stenosis.
3. Chronic T12 compression fracture.

CT LUMBAR MYELOGRAM IMPRESSION:

1. Severe facet arthropathy at L4-L5 contributing to multifactorial
mild spinal, lateral recess, and foraminal stenosis.
2. Multifactorial right lateral recess stenosis at L5-S1 where
conjoined right L5 and S1 nerve roots are suspected. Query right S1
radiculitis. Mild to moderate right L5 foraminal stenosis.

## 2017-01-24 ENCOUNTER — Ambulatory Visit: Payer: Self-pay | Admitting: Nurse Practitioner

## 2017-02-11 ENCOUNTER — Ambulatory Visit: Payer: Self-pay | Admitting: Nurse Practitioner

## 2017-03-15 ENCOUNTER — Ambulatory Visit (INDEPENDENT_AMBULATORY_CARE_PROVIDER_SITE_OTHER): Payer: Medicaid Other | Admitting: Nurse Practitioner

## 2017-03-15 ENCOUNTER — Encounter: Payer: Self-pay | Admitting: Nurse Practitioner

## 2017-03-15 VITALS — BP 148/88 | HR 118 | Temp 98.4°F | Ht 72.0 in | Wt 299.8 lb

## 2017-03-15 DIAGNOSIS — K219 Gastro-esophageal reflux disease without esophagitis: Secondary | ICD-10-CM | POA: Diagnosis not present

## 2017-03-15 DIAGNOSIS — G894 Chronic pain syndrome: Secondary | ICD-10-CM | POA: Diagnosis not present

## 2017-03-15 DIAGNOSIS — I10 Essential (primary) hypertension: Secondary | ICD-10-CM

## 2017-03-15 DIAGNOSIS — F1911 Other psychoactive substance abuse, in remission: Secondary | ICD-10-CM

## 2017-03-15 DIAGNOSIS — Z87898 Personal history of other specified conditions: Secondary | ICD-10-CM | POA: Diagnosis not present

## 2017-03-15 MED ORDER — TIZANIDINE HCL 2 MG PO CAPS: 2.0000 mg | ORAL_CAPSULE | Freq: Three times a day (TID) | ORAL | 0 refills | Status: DC

## 2017-03-15 MED ORDER — LISINOPRIL-HYDROCHLOROTHIAZIDE 10-12.5 MG PO TABS: 1.0000 | ORAL_TABLET | Freq: Every day | ORAL | 3 refills | Status: DC

## 2017-03-15 MED ORDER — FAMOTIDINE 40 MG PO TABS: 40.0000 mg | ORAL_TABLET | Freq: Two times a day (BID) | ORAL | 0 refills | Status: DC

## 2017-03-15 MED ORDER — FAMOTIDINE 40 MG PO TABS: 40.0000 mg | ORAL_TABLET | Freq: Two times a day (BID) | ORAL | 1 refills | Status: DC

## 2017-03-15 NOTE — Progress Notes (Signed)
Subjective:    Patient ID: Daniel [name], male    DOB: [birth year], 52 y.o.   MRN: [id number]  Daniel [name] is a 52 y.o. male presenting on 03/15/2017 for Establish Care (hypertension, chronic back pain )   HPI Establish Care New Provider Pt last seen by PCP [facility] [address] - last visit about 4 months ago.  Obtain records.  - Dr. Marguerite Olea - existing relationship at [facility]  Chronic Back Pain Recently worsening with increased movement.  Chronic pain without known injury or trauma and desires relief.  Compression with back brace and pressing back against hard objects helps pain.  Pt has not had any treatment for his pain and would like medication.  Pain interferes with ability to complete daily tasks.  Pt independent with ADLs but has difficulty with some iADLs that involve bending at the waist.  Was seen by Dr. Edward Jolly 11/2016 at pain management, but has not continued his care there.  Substance abuse history Pt is in halfway house at this time.  He is no longer using any substances and reports being sober for several months. - Needs housing arranged: and FL2 completed - Charnell May Transition Coordinator [phone] - Currently working with employment agency, peer support through [employer] to maintain sobriety  Hypertension - He is not checking BP at home or outside of clinic.    - Current medications: lisinopril-hctz 12-12.5 mg once daily, tolerating well without side effects - He is not currently symptomatic. - Pt denies headache, lightheadedness, dizziness, changes in vision, chest tightness/pressure, palpitations, leg swelling, sudden loss of speech or loss of consciousness. - He  reports no regular exercise routine. - His diet is high in salt, high in fat, and moderate in carbohydrates.   Past Medical History:  Diagnosis Date  . Allergy   . Anxiety   . Back pain   . Chronic pain   . Hypertension   . Opioid abuse (HCC)   . Urinary retention    Past Surgical History:   Procedure Laterality Date  . BACK SURGERY    . KNEE SURGERY Left    Social History   Socioeconomic History  . Marital status: Divorced    Spouse name: Not on file  . Number of children: Not on file  . Years of education: Not on file  . Highest education level: Not on file  Social Needs  . Financial resource strain: Not on file  . Food insecurity - worry: Not on file  . Food insecurity - inability: Not on file  . Transportation needs - medical: Not on file  . Transportation needs - non-medical: Not on file  Occupational History  . Not on file  Tobacco Use  . Smoking status: Former Smoker    Types: E-cigarettes    Last attempt to quit: 03/16/2015    Years since quitting: 2.0  . Smokeless tobacco: Never Used  Substance and Sexual Activity  . Alcohol use: No  . Drug use: No    Comment: Former use of cocaine and opioids  . Sexual activity: Not on file  Other Topics Concern  . Not on file  Social History Narrative  . Not on file   Family History  Problem Relation Age of Onset  . Alcohol abuse Father    Current Outpatient Medications on File Prior to Visit  Medication Sig  . escitalopram (LEXAPRO) 20 MG tablet Take by mouth.  . gabapentin (NEURONTIN) 600 MG tablet Take 600 mg by mouth  3 (three) times daily.  . traZODone (DESYREL) 100 MG tablet take 2 tablet by mouth at bedtime  . amitriptyline (ELAVIL) 50 MG tablet Take 200 mg by mouth at bedtime. Reported on 07/25/2015. On Hold at Pharmacy   . busPIRone (BUSPAR) 15 MG tablet Take 15 mg by mouth 3 (three) times daily.   No current facility-administered medications on file prior to visit.     Review of Systems  Constitutional: Negative.   HENT: Positive for dental problem.   Eyes: Negative.   Respiratory: Negative.   Cardiovascular: Negative.   Gastrointestinal: Negative.   Endocrine: Negative.   Genitourinary: Negative.   Musculoskeletal: Positive for arthralgias and back pain.  Skin: Negative.     Allergic/Immunologic: Negative.   Hematological: Negative.   Psychiatric/Behavioral: Positive for dysphoric mood. Negative for suicidal ideas. The patient is nervous/anxious.    Per HPI unless specifically indicated above      Objective:    BP (!) 148/88 (BP Location: Right Arm, Patient Position: Sitting, Cuff Size: Large)   Pulse (!) 118   Temp 98.4 F (36.9 C) (Oral)   Ht 6' (1.829 m)   Wt 299 lb 12.8 oz (136 kg)   SpO2 97%   BMI 40.66 kg/m   Wt Readings from Last 3 Encounters:  03/15/17 299 lb 12.8 oz (136 kg)  10/19/16 (!) 320 lb (145.2 kg)  04/22/16 300 lb (136.1 kg)    Physical Exam  General - overweight, well-appearing, NAD HEENT - Normocephalic, atraumatic Neck - supple, non-tender, no LAD, no thyromegaly, no carotid bruit Heart - RRR, no murmurs heard Lungs - Clear throughout all lobes, no wheezing, crackles, or rhonchi. Normal work of breathing. Extremeties - non-tender, no edema, cap refill < 2 seconds, peripheral pulses intact +2 bilaterally Skin - warm, dry Neuro - awake, alert, oriented x3, normal gait Psych - Normal mood and anxious affect, fidgeting/hyperactive behavior       Assessment & Plan:   Problem List Items Addressed This Visit      Cardiovascular and Mediastinum   Hypertension    Uncontrolled hypertension off medication currently, but previously stable on lisinopril HCTZ and tolerated well.  BP at goal.  Pt not working on lifestyle modifications.  Taking medications tolerating well without side effects in past.  No currently identified complications.  Plan: 1. RESUME taking lisinopril-HCTZ 10-12.5 mg once daily 2. Obtain labs BMP now, CMP 2 weeks. 3. Encouraged heart healthy diet and increasing exercise to 30 minutes most days of the week. 4. Check BP 1-2 x per week at home, keep log, and bring to clinic at next appointment. 5. Follow up 1 months.        Relevant Medications   lisinopril-hydrochlorothiazide (PRINZIDE,ZESTORETIC)  10-12.5 MG tablet     Digestive   Gastroesophageal reflux disease    Pt with acid reflux and taking ranitidine.  Pt requests refill.  Provided for pt today.      Relevant Medications   famotidine (PEPCID) 40 MG tablet     Other   History of substance abuse (Chronic)    Pt with history of substance abuse.  Has undergone withdrawal through directed program and is continuing therapy with [facility].  Is currently in housing that does not support his sobriety with drug use in the area.  Pt requests housing form for obtaining different housing.  FL2 to be completed      Chronic pain - Primary    Pt with uncontrolled chronic pain.  Will refill tizanidine at  2 mg tid prn for back pain/muscle spasm.  Should reconnect with pain management Dr. Roselie Skinner for continuing care.      Relevant Medications   escitalopram (LEXAPRO) 20 MG tablet      Meds ordered this encounter  Medications  . tizanidine (ZANAFLEX) 2 MG capsule    Sig: Take 1 capsule (2 mg total) by mouth 3 (three) times daily.    Dispense:  90 capsule    Refill:  0    Order Specific Question:   Supervising Provider    Answer:   Smitty Cords [[id number]]  . lisinopril-hydrochlorothiazide (PRINZIDE,ZESTORETIC) 10-12.5 MG tablet    Sig: Take 1 tablet by mouth daily.    Dispense:  90 tablet    Refill:  3    Order Specific Question:   Supervising Provider    Answer:   Smitty Cords [[id number]]  . famotidine (PEPCID) 40 MG tablet    Sig: Take 1 tablet (40 mg total) by mouth 2 (two) times daily.    Dispense:  60 tablet    Refill:  1    Order Specific Question:   Supervising Provider    Answer:   Smitty Cords [[id number]]    Follow up plan: Return in about 4 weeks (around 04/12/2017) for blood pressure, GERD.  Wilhelmina Mcardle, DNP, AGPCNP-BC Adult Gerontology Primary Care Nurse Practitioner [facility] 03/31/2017, 9:26 PM

## 2017-03-15 NOTE — Patient Instructions (Addendum)
Thereasa DistanceRodney, Thank you for coming in to clinic today.  1. START lisinopril-HCTZ 10-12.5 mg once daily for blood pressure.  2. START famotidine 40 mg twice daily for acid reflux.  3. START tizanidine 2 mg three times daily as needed for muscle spasm.  4. Continue care with [facility] for psychiatry.  5. I am sending a [facility] pain management.  They will call you for an appointment.  Please schedule a follow-up appointment with [name], AGNP. Return in about 4 weeks (around 04/12/2017) for blood pressure, GERD.  If you have any other questions or concerns, please feel free to call the clinic or send a message through MyChart. You may also schedule an earlier appointment if necessary.  You will receive a survey after today's visit either digitally by e-mail or paper by [employer] mail. Your experiences and feedback matter to us.  Please respond so we know how we are doing as we provide care for you.   Wilhelmina McardleLauren Ruthy Forry, DNP, AGNP-BC Adult Gerontology Nurse Practitioner [facility], [facility]

## 2017-03-23 ENCOUNTER — Other Ambulatory Visit: Payer: Self-pay

## 2017-03-23 MED ORDER — TIZANIDINE HCL 2 MG PO TABS: 2.0000 mg | ORAL_TABLET | Freq: Three times a day (TID) | ORAL | 0 refills | Status: DC

## 2017-03-31 ENCOUNTER — Encounter: Payer: Self-pay | Admitting: Nurse Practitioner

## 2017-03-31 ENCOUNTER — Other Ambulatory Visit: Payer: Self-pay | Admitting: Nurse Practitioner

## 2017-03-31 ENCOUNTER — Telehealth: Payer: Self-pay | Admitting: Nurse Practitioner

## 2017-03-31 DIAGNOSIS — F1911 Other psychoactive substance abuse, in remission: Secondary | ICD-10-CM | POA: Insufficient documentation

## 2017-03-31 DIAGNOSIS — K219 Gastro-esophageal reflux disease without esophagitis: Secondary | ICD-10-CM | POA: Insufficient documentation

## 2017-03-31 NOTE — Assessment & Plan Note (Signed)
Pt with acid reflux and taking ranitidine.  Pt requests refill.  Provided for pt today.

## 2017-03-31 NOTE — Assessment & Plan Note (Addendum)
Uncontrolled hypertension off medication currently, but previously stable on lisinopril HCTZ and tolerated well.  BP at goal.  Pt not working on lifestyle modifications.  Taking medications tolerating well without side effects in past.  No currently identified complications.  Plan: 1. RESUME taking lisinopril-HCTZ 10-12.5 mg once daily 2. Obtain labs BMP now, CMP 2 weeks. 3. Encouraged heart healthy diet and increasing exercise to 30 minutes most days of the week. 4. Check BP 1-2 x per week at home, keep log, and bring to clinic at next appointment. 5. Follow up 1 months.

## 2017-03-31 NOTE — Assessment & Plan Note (Signed)
Pt with uncontrolled chronic pain.  Will refill tizanidine at 2 mg tid prn for back pain/muscle spasm.  Should reconnect with pain management Dr. Roselie SkinnerBilal for continuing care.

## 2017-03-31 NOTE — Telephone Encounter (Signed)
Faxed FL2 form.

## 2017-03-31 NOTE — Assessment & Plan Note (Signed)
Pt with history of substance abuse.  Has undergone withdrawal through directed program and is continuing therapy with [facility].  Is currently in housing that does not support his sobriety with drug use in the area.  Pt requests housing form for obtaining different housing.  FL2 to be completed

## 2017-03-31 NOTE — Telephone Encounter (Signed)
Pt said his FL2 has not been received by [employer].  It needs to be faxed attn: Charnell [phone] or [phone]. Charnell's call back number is [phone]

## 2017-04-01 ENCOUNTER — Encounter: Payer: Self-pay | Admitting: Nurse Practitioner

## 2017-04-12 ENCOUNTER — Ambulatory Visit: Payer: Self-pay | Admitting: Nurse Practitioner

## 2017-05-31 ENCOUNTER — Other Ambulatory Visit: Payer: Self-pay

## 2017-05-31 ENCOUNTER — Ambulatory Visit (INDEPENDENT_AMBULATORY_CARE_PROVIDER_SITE_OTHER): Payer: Medicaid Other | Admitting: Nurse Practitioner

## 2017-05-31 ENCOUNTER — Encounter: Payer: Self-pay | Admitting: Nurse Practitioner

## 2017-05-31 VITALS — BP 119/83 | HR 110 | Temp 98.0°F | Ht 72.0 in | Wt 289.0 lb

## 2017-05-31 DIAGNOSIS — G8929 Other chronic pain: Secondary | ICD-10-CM

## 2017-05-31 DIAGNOSIS — M4696 Unspecified inflammatory spondylopathy, lumbar region: Secondary | ICD-10-CM | POA: Diagnosis not present

## 2017-05-31 DIAGNOSIS — K219 Gastro-esophageal reflux disease without esophagitis: Secondary | ICD-10-CM

## 2017-05-31 DIAGNOSIS — I10 Essential (primary) hypertension: Secondary | ICD-10-CM | POA: Diagnosis not present

## 2017-05-31 DIAGNOSIS — M5441 Lumbago with sciatica, right side: Secondary | ICD-10-CM | POA: Diagnosis not present

## 2017-05-31 DIAGNOSIS — Z716 Tobacco abuse counseling: Secondary | ICD-10-CM

## 2017-05-31 MED ORDER — OMEPRAZOLE 20 MG PO CPDR: 20.0000 mg | DELAYED_RELEASE_CAPSULE | Freq: Every day | ORAL | 3 refills | Status: DC

## 2017-05-31 MED ORDER — BACLOFEN 10 MG PO TABS: 10.0000 mg | ORAL_TABLET | Freq: Three times a day (TID) | ORAL | 0 refills | Status: DC

## 2017-05-31 MED ORDER — NICOTINE 7 MG/24HR TD PT24: 7.0000 mg | MEDICATED_PATCH | Freq: Every day | TRANSDERMAL | 0 refills | Status: DC

## 2017-05-31 MED ORDER — BACLOFEN 10 MG PO TABS: 10.0000 mg | ORAL_TABLET | Freq: Three times a day (TID) | ORAL | 1 refills | Status: DC

## 2017-05-31 MED ORDER — NICOTINE 14 MG/24HR TD PT24: 14.0000 mg | MEDICATED_PATCH | Freq: Every day | TRANSDERMAL | 0 refills | Status: AC

## 2017-05-31 NOTE — Patient Instructions (Addendum)
Daniel [name],   Thank you for coming in to clinic today.  1. Once you start your patches, please stop all other sources of nicotine (dip, snuff, cigarettes).  2. STOP your BP med - you don't need it.  3. STOP famotidine.  START omeprazole for your GERD.  Take 20 mg tablet once daily.  4. Back Pain: baclofen instead of tizanidine.  Take 10 mg up to three times daily for muscle spasms.   Please schedule a follow-up appointment with Wilhelmina McardleLauren Wiktoria Hemrick, AGNP. Return in about 2 months (around 07/31/2017) for nicotine cessation.  If you have any other questions or concerns, please feel free to call the clinic or send a message through MyChart. You may also schedule an earlier appointment if necessary.  You will receive a survey after today's visit either digitally by e-mail or paper by [employer] mail. Your experiences and feedback matter to us.  Please respond so we know how we are doing as we provide care for you.   Wilhelmina McardleLauren Sherae Santino, DNP, AGNP-BC Adult Gerontology Nurse Practitioner [facility], [facility]

## 2017-05-31 NOTE — Progress Notes (Signed)
Subjective:    Patient ID: Daniel [name], male    DOB: 09/09/1965, 52 y.o.   MRN: [id number]  Daniel [name] is a 52 y.o. male presenting on 05/31/2017 for Hypertension (pt states he's been out of his medication x 1 mth. ); Loss of Consciousness (pt states he have fainting spells with his bp medication drops his blood pressure to low and would like lower the dosage. His last fainting spell was 3-4 weelks ago.); and Nicotine Dependence (requesting smoking patches)   HPI Hypertension - He is not checking BP at home or outside of clinic.    - Current medications: lisinopril-hydrochlorothiazide, tolerating with side effects of dizziness/syncope.  Last syncope 3-4 weeks ago and is no longer taking medication. - He is not currently symptomatic now that he has been out of medication for > 1 month. - Pt denies headache, lightheadedness, dizziness, changes in vision, chest tightness/pressure, palpitations, leg swelling, sudden loss of speech or loss of consciousness.  Morbid Obesity - BMI 39.2 with GERD, HTN - Pt is regularly active with walking almost daily. - His diet is moderate in salt, moderate in fat, and moderate to high in carbohydrates. He relies heavily on food bank/soup kitchen.  Is now preparing his own meals over last 3-4 days and has started to see some improvement in what he eats.  States he is hopeful for weight loss to continue.  Nicotine Dependence Pt requests to start nicotine patches for helping reduce smoking. Was smoking 1 pack per 1.5 days.  Now uses smokeless tobacco (dip) 4 times per day.  A small can lasts 2 days.  He is noting increased heartburn with use.  He doesn't like smokeless tobacco, but feels this is current option as he is no longer allowed to smoke in or near his home.  He has used smokeless x 3 days and is ready to work to stop smoking.  GERD [place] problem, but has worsened with nicotine use.  Famotidine is not sufficient for his symptoms and requests to use an  alternative medication.  Pt reports he was having breakthrough heartburn before using snuff.  Social History   Tobacco Use  . Smoking status: Former Smoker    Types: E-cigarettes    Last attempt to quit: 03/16/2015    Years since quitting: 2.2  . Smokeless tobacco: Never Used  Substance Use Topics  . Alcohol use: No  . Drug use: No    Comment: Former use of cocaine and opioids    Review of Systems Per HPI unless specifically indicated above     Objective:    BP 119/83 (BP Location: Right Arm, Patient Position: Sitting, Cuff Size: Large)   Pulse (!) 110   Temp 98 F (36.7 C) (Oral)   Ht 6' (1.829 m)   Wt 289 lb (131.1 kg)   BMI 39.20 kg/m   Wt Readings from Last 3 Encounters:  05/31/17 289 lb (131.1 kg)  03/15/17 299 lb 12.8 oz (136 kg)  10/19/16 (!) 320 lb (145.2 kg)    Physical Exam  Constitutional: He is oriented to person, place, and time. He appears well-developed and well-nourished. No distress.  HENT:  Head: Normocephalic and atraumatic.  Right Ear: External ear normal.  Left Ear: External ear normal.  Nose: Nose normal.  Mouth/Throat: Oropharynx is clear and moist.  Eyes: Pupils are equal, round, and reactive to light. Conjunctivae are normal.  Neck: Normal range of motion. Neck supple. No JVD present. No tracheal deviation present. No  thyromegaly present.  Cardiovascular: Normal rate, regular rhythm, normal heart sounds and intact distal pulses. Exam reveals no gallop and no friction rub.  No murmur heard. Pulmonary/Chest: Effort normal and breath sounds normal. No respiratory distress.  Abdominal: Soft. Bowel sounds are normal. He exhibits no distension. There is no tenderness.  Musculoskeletal: Normal range of motion.  Lymphadenopathy:    He has no cervical adenopathy.  Neurological: He is alert and oriented to person, place, and time.  Skin: Skin is warm and dry.  Psychiatric: He has a normal mood and affect. His behavior is normal. Judgment and thought  content normal.  Nursing note and vitals reviewed.    Results for orders placed or performed during the hospital encounter of 04/15/16  Acetaminophen level  Result Value Ref Range   Acetaminophen (Tylenol), Serum <10 (L) 10 - 30 ug/mL  Comprehensive metabolic panel  Result Value Ref Range   Sodium 131 (L) 135 - 145 mmol/L   Potassium 3.5 3.5 - 5.1 mmol/L   Chloride 94 (L) 101 - 111 mmol/L   CO2 29 22 - 32 mmol/L   Glucose, Bld 147 (H) 65 - 99 mg/dL   BUN 15 6 - 20 mg/dL   Creatinine, Ser 1.61 0.61 - 1.24 mg/dL   Calcium 8.4 (L) 8.9 - 10.3 mg/dL   Total Protein 7.8 6.5 - 8.1 g/dL   Albumin 4.1 3.5 - 5.0 g/dL   AST 40 15 - 41 U/L   ALT 60 17 - 63 U/L   Alkaline Phosphatase 75 38 - 126 U/L   Total Bilirubin 0.7 0.3 - 1.2 mg/dL   GFR calc non Af Amer >60 >60 mL/min   GFR calc Af Amer >60 >60 mL/min   Anion gap 8 5 - 15  Ethanol  Result Value Ref Range   Alcohol, Ethyl (B) <5 <5 mg/dL  CBC with Differential  Result Value Ref Range   WBC 8.2 3.8 - 10.6 K/uL   RBC 4.38 (L) 4.40 - 5.90 MIL/uL   Hemoglobin 13.4 13.0 - 18.0 g/dL   HCT 09.6 (L) 04.5 - 40.9 %   MCV 89.5 80.0 - 100.0 fL   MCH 30.5 26.0 - 34.0 pg   MCHC 34.1 32.0 - 36.0 g/dL   RDW 81.1 91.4 - 78.2 %   Platelets 220 150 - 440 K/uL   Neutrophils Relative % 77 %   Neutro Abs 6.2 1.4 - 6.5 K/uL   Lymphocytes Relative 15 %   Lymphs Abs 1.2 1.0 - 3.6 K/uL   Monocytes Relative 7 %   Monocytes Absolute 0.6 0.2 - 1.0 K/uL   Eosinophils Relative 1 %   Eosinophils Absolute 0.1 0 - 0.7 K/uL   Basophils Relative 0 %   Basophils Absolute 0.0 0 - 0.1 K/uL  Salicylate level  Result Value Ref Range   Salicylate Lvl <7.0 2.8 - 30.0 mg/dL  Urinalysis, Complete w Microscopic  Result Value Ref Range   Color, Urine YELLOW (A) YELLOW   APPearance CLEAR (A) CLEAR   Specific Gravity, Urine 1.016 1.005 - 1.030   pH 5.0 5.0 - 8.0   Glucose, UA NEGATIVE NEGATIVE mg/dL   Hgb urine dipstick NEGATIVE NEGATIVE   Bilirubin Urine  NEGATIVE NEGATIVE   Ketones, ur NEGATIVE NEGATIVE mg/dL   Protein, ur NEGATIVE NEGATIVE mg/dL   Nitrite NEGATIVE NEGATIVE   Leukocytes, UA NEGATIVE NEGATIVE   RBC / HPF NONE SEEN 0 - 5 RBC/hpf   WBC, UA NONE SEEN 0 - 5 WBC/hpf  Bacteria, UA NONE SEEN NONE SEEN   Squamous Epithelial / LPF NONE SEEN NONE SEEN   Mucus PRESENT    Hyaline Casts, UA PRESENT   Urine Drug Screen, Qualitative  Result Value Ref Range   Tricyclic, Ur Screen POSITIVE (A) NONE DETECTED   Amphetamines, Ur Screen POSITIVE (A) NONE DETECTED   MDMA (Ecstasy)Ur Screen NONE DETECTED NONE DETECTED   Cocaine Metabolite,Ur Pink NONE DETECTED NONE DETECTED   Opiate, Ur Screen NONE DETECTED NONE DETECTED   Phencyclidine (PCP) Ur S NONE DETECTED NONE DETECTED   Cannabinoid 50 Ng, Ur [place] NONE DETECTED NONE DETECTED   Barbiturates, Ur Screen NONE DETECTED NONE DETECTED   Benzodiazepine, Ur Scrn POSITIVE (A) NONE DETECTED   Methadone Scn, Ur NONE DETECTED NONE DETECTED      Assessment & Plan:   Problem List Items Addressed This Visit      Cardiovascular and Mediastinum   Hypertension    Uncontrolled hypertension off medication currently.  Pt has had some weight loss and change of diet which may be contributing to improved control.  Pt has had syncopal events, but none since stopping medication.   Plan: 1. STOP taking lisinopril-HCTZ 10-12.5 mg once daily 2. Last labs with normal kidney function. 3. Encouraged heart healthy diet and increasing exercise to 30 minutes most days of the week. 4. Check BP 1-2 x per week at home, keep log, and bring to clinic at next appointment. 5. Follow up 2-3 months.         Digestive   Gastroesophageal reflux disease    Uncontrolled symptoms in pt with acid reflux taking ranitidine.  Pt requests alternative medication that may help improve GERD control.  Plan: 1. STOP ranitidine. 2. START omeprazole 20 mg once daily. 3. Dietary recommendations to work on weight loss.  Eating smaller  meals and avoiding trigger foods will help symptoms of GERD. 4. Followup 3 months.      Relevant Medications   omeprazole (PRILOSEC) 20 MG capsule     Nervous and Auditory   Chronic midline low back pain with right-sided sciatica - Primary    Pt with uncontrolled back pain and sciatica.  Prefers to have refill of pain medications.    Plan: 1. Discussed with patient we will not provide chronic opioids.  Pt with history of substance abuse now in remission/abstinence/sobriety period of approximately 6 months.   2. Continue psychiatry with [facility]. 3. START baclofen 10 mg up to three times daily.  Change from tizanidine. 4. Follow up 2-3 months.  Consider referral to pain management clinic. Pt states he is unable to go to [facility] as he was discharged from that clinic for non-compliance in past.      Relevant Medications   nicotine (NICODERM CQ - DOSED IN MG/24 HOURS) 14 mg/24hr patch   nicotine (NICODERM CQ - DOSED IN MG/24 HR) 7 mg/24hr patch (Start on 06/28/2017)   baclofen (LIORESAL) 10 MG tablet     Musculoskeletal and Integument   Inflammatory spondylopathy of lumbar region [facility])    Currently uncontrolled.  Will treat sciatica as above in chronic pain AP.      Relevant Medications   baclofen (LIORESAL) 10 MG tablet     Other   Morbid obesity (HCC)    Morbid obesity improving since last visit.  Now at BMI < 40, but continues to have comorbidities of GERD, Hypertension.   Plan: 1. Discussed continued weight loss with patient.  Pt motivated to continue weight loss for back  pain and other medical conditions.  Now with more control over diet with preparing own meals.     2. Increase physical activity. 3. Followup 2-3 months.      Encounter for smoking cessation counseling    Patient now living in apartment that prohibits smoking.  Desires to stop smoking and is ready to quit.    Plan: 1. Discussed options of pharmacotherapies and pt with guidance of clinician have  decided to use nicotine patches for smoking cessation. 2. START nicotine patch 14 mg once daily for 28 days.  STOP use of all additional nicotine products on same day he starts patch. 3. After 28 days, START nicotine patch 7 mg once daily for 28 days.  Then, stop using patches. 4. Followup 2 months.      Relevant Medications   nicotine (NICODERM CQ - DOSED IN MG/24 HOURS) 14 mg/24hr patch   nicotine (NICODERM CQ - DOSED IN MG/24 HR) 7 mg/24hr patch (Start on 06/28/2017)      Meds ordered this encounter  Medications  . nicotine (NICODERM CQ - DOSED IN MG/24 HOURS) 14 mg/24hr patch    Sig: Place 1 patch (14 mg total) onto the skin daily for 28 days.    Dispense:  28 patch    Refill:  0    Order Specific Question:   Supervising Provider    Answer:   Smitty CordsKARAMALEGOS, ALEXANDER J [[id number]]  . nicotine (NICODERM CQ - DOSED IN MG/24 HR) 7 mg/24hr patch    Sig: Place 1 patch (7 mg total) onto the skin daily.    Dispense:  28 patch    Refill:  0    Order Specific Question:   Supervising Provider    Answer:   Smitty CordsKARAMALEGOS, ALEXANDER J [[id number]]  . omeprazole (PRILOSEC) 20 MG capsule    Sig: Take 1 capsule (20 mg total) by mouth daily.    Dispense:  30 capsule    Refill:  3    Order Specific Question:   Supervising Provider    Answer:   Smitty CordsKARAMALEGOS, ALEXANDER J [[id number]]  . baclofen (LIORESAL) 10 MG tablet    Sig: Take 1 tablet (10 mg total) by mouth 3 (three) times daily.    Dispense:  90 each    Refill:  1    Order Specific Question:   Supervising Provider    Answer:   Smitty CordsKARAMALEGOS, ALEXANDER J [[id number]]   Follow up plan: Return in about 2 months (around 07/31/2017) for nicotine cessation.  Wilhelmina McardleLauren Davine Coba, DNP, AGPCNP-BC Adult Gerontology Primary Care Nurse Practitioner [facility] 06/01/2017, 1:28 PM

## 2017-06-01 ENCOUNTER — Encounter: Payer: Self-pay | Admitting: Nurse Practitioner

## 2017-06-01 DIAGNOSIS — G8929 Other chronic pain: Secondary | ICD-10-CM | POA: Insufficient documentation

## 2017-06-01 DIAGNOSIS — Z716 Tobacco abuse counseling: Secondary | ICD-10-CM | POA: Insufficient documentation

## 2017-06-01 DIAGNOSIS — M4696 Unspecified inflammatory spondylopathy, lumbar region: Secondary | ICD-10-CM | POA: Insufficient documentation

## 2017-06-01 DIAGNOSIS — M5441 Lumbago with sciatica, right side: Principal | ICD-10-CM

## 2017-06-01 NOTE — Assessment & Plan Note (Signed)
Currently uncontrolled.  Will treat sciatica as above in chronic pain AP.

## 2017-06-01 NOTE — Assessment & Plan Note (Signed)
Morbid obesity improving since last visit.  Now at BMI < 40, but continues to have comorbidities of GERD, Hypertension.   Plan: 1. Discussed continued weight loss with patient.  Pt motivated to continue weight loss for back pain and other medical conditions.  Now with more control over diet with preparing own meals.     2. Increase physical activity. 3. Followup 2-3 months.

## 2017-06-01 NOTE — Assessment & Plan Note (Signed)
Uncontrolled symptoms in pt with acid reflux taking ranitidine.  Pt requests alternative medication that may help improve GERD control.  Plan: 1. STOP ranitidine. 2. START omeprazole 20 mg once daily. 3. Dietary recommendations to work on weight loss.  Eating smaller meals and avoiding trigger foods will help symptoms of GERD. 4. Followup 3 months.

## 2017-06-01 NOTE — Assessment & Plan Note (Signed)
Pt with uncontrolled back pain and sciatica.  Prefers to have refill of pain medications.    Plan: 1. Discussed with patient we will not provide chronic opioids.  Pt with history of substance abuse now in remission/abstinence/sobriety period of approximately 6 months.   2. Continue psychiatry with [facility]. 3. START baclofen 10 mg up to three times daily.  Change from tizanidine. 4. Follow up 2-3 months.  Consider referral to pain management clinic. Pt states he is unable to go to [facility] as he was discharged from that clinic for non-compliance in past.

## 2017-06-01 NOTE — Assessment & Plan Note (Signed)
Uncontrolled hypertension off medication currently.  Pt has had some weight loss and change of diet which may be contributing to improved control.  Pt has had syncopal events, but none since stopping medication.   Plan: 1. STOP taking lisinopril-HCTZ 10-12.5 mg once daily 2. Last labs with normal kidney function. 3. Encouraged heart healthy diet and increasing exercise to 30 minutes most days of the week. 4. Check BP 1-2 x per week at home, keep log, and bring to clinic at next appointment. 5. Follow up 2-3 months.

## 2017-06-01 NOTE — Assessment & Plan Note (Signed)
Patient now living in apartment that prohibits smoking.  Desires to stop smoking and is ready to quit.    Plan: 1. Discussed options of pharmacotherapies and pt with guidance of clinician have decided to use nicotine patches for smoking cessation. 2. START nicotine patch 14 mg once daily for 28 days.  STOP use of all additional nicotine products on same day he starts patch. 3. After 28 days, START nicotine patch 7 mg once daily for 28 days.  Then, stop using patches. 4. Followup 2 months.

## 2017-07-27 ENCOUNTER — Other Ambulatory Visit: Payer: Self-pay | Admitting: Nurse Practitioner

## 2017-07-27 DIAGNOSIS — Z716 Tobacco abuse counseling: Secondary | ICD-10-CM

## 2017-07-27 DIAGNOSIS — G8929 Other chronic pain: Secondary | ICD-10-CM

## 2017-07-27 DIAGNOSIS — M5441 Lumbago with sciatica, right side: Secondary | ICD-10-CM

## 2017-07-27 MED ORDER — NICOTINE 7 MG/24HR TD PT24: 7.0000 mg | MEDICATED_PATCH | Freq: Every day | TRANSDERMAL | 0 refills | Status: DC

## 2017-07-27 MED ORDER — BACLOFEN 10 MG PO TABS: 10.0000 mg | ORAL_TABLET | Freq: Three times a day (TID) | ORAL | 1 refills | Status: DC

## 2017-07-27 NOTE — Telephone Encounter (Signed)
Pt needs refills on omeprazole, baclofen and patches.  His call back number is [phone]

## 2017-08-01 ENCOUNTER — Ambulatory Visit: Payer: Self-pay | Admitting: Nurse Practitioner

## 2017-08-02 ENCOUNTER — Ambulatory Visit: Payer: Self-pay | Admitting: Nurse Practitioner

## 2017-08-29 ENCOUNTER — Other Ambulatory Visit: Payer: Self-pay

## 2017-08-29 ENCOUNTER — Encounter: Payer: Self-pay | Admitting: Nurse Practitioner

## 2017-08-29 ENCOUNTER — Ambulatory Visit (INDEPENDENT_AMBULATORY_CARE_PROVIDER_SITE_OTHER): Payer: Medicaid Other | Admitting: Nurse Practitioner

## 2017-08-29 VITALS — BP 118/96 | HR 111 | Temp 98.2°F | Ht 72.0 in | Wt 288.8 lb

## 2017-08-29 DIAGNOSIS — M5441 Lumbago with sciatica, right side: Secondary | ICD-10-CM

## 2017-08-29 DIAGNOSIS — Z716 Tobacco abuse counseling: Secondary | ICD-10-CM

## 2017-08-29 DIAGNOSIS — F17200 Nicotine dependence, unspecified, uncomplicated: Secondary | ICD-10-CM | POA: Diagnosis not present

## 2017-08-29 DIAGNOSIS — G8929 Other chronic pain: Secondary | ICD-10-CM

## 2017-08-29 DIAGNOSIS — K921 Melena: Secondary | ICD-10-CM | POA: Diagnosis not present

## 2017-08-29 DIAGNOSIS — K648 Other hemorrhoids: Secondary | ICD-10-CM

## 2017-08-29 MED ORDER — NICOTINE 14 MG/24HR TD PT24: 14.0000 mg | MEDICATED_PATCH | Freq: Every day | TRANSDERMAL | 0 refills | Status: DC

## 2017-08-29 MED ORDER — NICOTINE 7 MG/24HR TD PT24: 7.0000 mg | MEDICATED_PATCH | Freq: Every day | TRANSDERMAL | 0 refills | Status: DC

## 2017-08-29 MED ORDER — NICOTINE 21 MG/24HR TD PT24: 21.0000 mg | MEDICATED_PATCH | Freq: Every day | TRANSDERMAL | 0 refills | Status: DC

## 2017-08-29 MED ORDER — TIZANIDINE HCL 4 MG PO TABS: 4.0000 mg | ORAL_TABLET | Freq: Three times a day (TID) | ORAL | 2 refills | Status: DC | PRN

## 2017-08-29 NOTE — Progress Notes (Signed)
Subjective:    Patient ID: Daniel [name], male    DOB: 1966/02/18, 52 y.o.   MRN: [id number]  Daniel [name] is a 52 y.o. male presenting on 08/29/2017 for Pain (referral to pain clinic [place], [place]); Nicotine Dependence; and Consult (Psychotherapeutic services, housing )   HPI  Psychatric Needs Skin picking - is resuming again.  Was previously seen by [facility]: Dr. Omelia Blackwater for psychiatric medications.  He has ended his relationship with [facility] and the "sayop" program.  Visit with Dr. Omelia Blackwater on 09/08/2017.  Patient has already transitioned to counselor with Dr. Casandra Doffing office.  Skin picking is new over the last 2 weeks.  Patient reports significantly increased anxiety anxiety worsened after reviewing Facebook for his estranged wife and daughter.  He desires to have relationship with his daughter to be supportive of her.  Low back pain Patient continues to have severe low back pain.  Baclofen has helped, but is not relieving pain as much as patient desires.  He requests to consider tramadol.  He admits after discussion he did not realize it was an opioid receptor binder.  He was last seen with neurosurgery approximately 1 year ago.  Surgical revision was recommended with placement of nerve stimulator.  Patient has not yet agreed to this plan.  States he is concerned his pain could be worse after surgery.  Housing form-[place] Patient moved into his current housing on May 30, 2017.  [place] to continue living there.  He reports significant improvement and environment to support his ongoing abstinence from opioid use.  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  Nicotine Dependency Patient is ready to stop smoking.  He is wanting to decrease because he cannot smoke where he lives now.  He has begun using snuff, but is having increased heartburn and does not really like using this tobacco product.    Social History   Tobacco  Use  . Smoking status: Current Every Day Smoker    Packs/day: 1.00    Types: E-cigarettes, Cigarettes    Last attempt to quit: 03/16/2015    Years since quitting: 2.4  . Smokeless tobacco: Never Used  Substance Use Topics  . Alcohol use: No  . Drug use: No    Comment: Former use of cocaine and opioids    Review of Systems  GI: blood in stool Per HPI unless specifically indicated above     Objective:    BP (!) 118/96 (BP Location: Right Arm, Patient Position: Sitting, Cuff Size: Large)   Pulse (!) 111   Temp 98.2 F (36.8 C) (Oral)   Ht 6' (1.829 m)   Wt 288 lb 12.8 oz (131 kg)   SpO2 97%   BMI 39.17 kg/m   Wt Readings from Last 3 Encounters:  08/29/17 288 lb 12.8 oz (131 kg)  05/31/17 289 lb (131.1 kg)  03/15/17 299 lb 12.8 oz (136 kg)    Physical Exam  Constitutional: He is oriented to person, place, and time. He appears well-developed and well-nourished. No distress.  HENT:  Head: Normocephalic and atraumatic.  Neck: Normal range of motion. Neck supple.  Cardiovascular: Normal rate, regular rhythm, S1 normal, S2 normal, normal heart sounds and intact distal pulses.  Pulmonary/Chest: Effort normal and breath sounds normal. No respiratory distress.  Neurological: He is alert and oriented to person, place, and time.  Skin: Skin is warm and dry. Capillary refill takes less  than 2 seconds.     Psychiatric: His behavior is normal. Judgment and thought content normal. His mood appears anxious. His speech is rapid and/or pressured. Cognition and memory are normal. He expresses no homicidal and no suicidal ideation. He expresses no suicidal plans and no homicidal plans.  Vitals reviewed.   Results for orders placed or performed during the hospital encounter of 04/15/16  Acetaminophen level  Result Value Ref Range   Acetaminophen (Tylenol), Serum <10 (L) 10 - 30 ug/mL  Comprehensive metabolic panel  Result Value Ref Range   Sodium 131 (L) 135 - 145 mmol/L   Potassium  3.5 3.5 - 5.1 mmol/L   Chloride 94 (L) 101 - 111 mmol/L   CO2 29 22 - 32 mmol/L   Glucose, Bld 147 (H) 65 - 99 mg/dL   BUN 15 6 - 20 mg/dL   Creatinine, Ser 1.61 0.61 - 1.24 mg/dL   Calcium 8.4 (L) 8.9 - 10.3 mg/dL   Total Protein 7.8 6.5 - 8.1 g/dL   Albumin 4.1 3.5 - 5.0 g/dL   AST 40 15 - 41 U/L   ALT 60 17 - 63 U/L   Alkaline Phosphatase 75 38 - 126 U/L   Total Bilirubin 0.7 0.3 - 1.2 mg/dL   GFR calc non Af Amer >60 >60 mL/min   GFR calc Af Amer >60 >60 mL/min   Anion gap 8 5 - 15  Ethanol  Result Value Ref Range   Alcohol, Ethyl (B) <5 <5 mg/dL  CBC with Differential  Result Value Ref Range   WBC 8.2 3.8 - 10.6 K/uL   RBC 4.38 (L) 4.40 - 5.90 MIL/uL   Hemoglobin 13.4 13.0 - 18.0 g/dL   HCT 09.6 (L) 04.5 - 40.9 %   MCV 89.5 80.0 - 100.0 fL   MCH 30.5 26.0 - 34.0 pg   MCHC 34.1 32.0 - 36.0 g/dL   RDW 81.1 91.4 - 78.2 %   Platelets 220 150 - 440 K/uL   Neutrophils Relative % 77 %   Neutro Abs 6.2 1.4 - 6.5 K/uL   Lymphocytes Relative 15 %   Lymphs Abs 1.2 1.0 - 3.6 K/uL   Monocytes Relative 7 %   Monocytes Absolute 0.6 0.2 - 1.0 K/uL   Eosinophils Relative 1 %   Eosinophils Absolute 0.1 0 - 0.7 K/uL   Basophils Relative 0 %   Basophils Absolute 0.0 0 - 0.1 K/uL  Salicylate level  Result Value Ref Range   Salicylate Lvl <7.0 2.8 - 30.0 mg/dL  Urinalysis, Complete w Microscopic  Result Value Ref Range   Color, Urine YELLOW (A) YELLOW   APPearance CLEAR (A) CLEAR   Specific Gravity, Urine 1.016 1.005 - 1.030   pH 5.0 5.0 - 8.0   Glucose, UA NEGATIVE NEGATIVE mg/dL   Hgb urine dipstick NEGATIVE NEGATIVE   Bilirubin Urine NEGATIVE NEGATIVE   Ketones, ur NEGATIVE NEGATIVE mg/dL   Protein, ur NEGATIVE NEGATIVE mg/dL   Nitrite NEGATIVE NEGATIVE   Leukocytes, UA NEGATIVE NEGATIVE   RBC / HPF NONE SEEN 0 - 5 RBC/hpf   WBC, UA NONE SEEN 0 - 5 WBC/hpf   Bacteria, UA NONE SEEN NONE SEEN   Squamous Epithelial / LPF NONE SEEN NONE SEEN   Mucus PRESENT    Hyaline  Casts, UA PRESENT   Urine Drug Screen, Qualitative  Result Value Ref Range   Tricyclic, Ur Screen POSITIVE (A) NONE DETECTED   Amphetamines, Ur Screen POSITIVE (A) NONE DETECTED  MDMA (Ecstasy)Ur Screen NONE DETECTED NONE DETECTED   Cocaine Metabolite,Ur [place] NONE DETECTED NONE DETECTED   Opiate, Ur Screen NONE DETECTED NONE DETECTED   Phencyclidine (PCP) Ur S NONE DETECTED NONE DETECTED   Cannabinoid 50 Ng, Ur  NONE DETECTED NONE DETECTED   Barbiturates, Ur Screen NONE DETECTED NONE DETECTED   Benzodiazepine, Ur Scrn POSITIVE (A) NONE DETECTED   Methadone Scn, Ur NONE DETECTED NONE DETECTED      Assessment & Plan:   Problem List Items Addressed This Visit      Nervous and Auditory   Chronic midline low back pain with right-sided sciatica Chronic pain, not improved with baclofen.  Muscle strain possible complicated by body habitus, sedentary lifestyle.  Plan:  1. Treat with OTC pain meds (acetaminophen and ibuprofen).  Discussed alternate dosing and max dosing. 2. Apply heat and/or ice to affected area. 3. May also apply a muscle rub with lidocaine or lidocaine patch after heat or ice. 4. Take muscle relaxer tizanidine 4 mg up to three times daily.  Cautioned drowsiness.  STOP baclofen.   5. Follow up 3 months    Relevant Medications   nicotine (NICODERM CQ - DOSED IN MG/24 HOURS) 21 mg/24hr patch   nicotine (NICODERM CQ - DOSED IN MG/24 HOURS) 14 mg/24hr patch   nicotine (NICODERM CQ - DOSED IN MG/24 HR) 7 mg/24hr patch   tiZANidine (ZANAFLEX) 4 MG tablet     Other   Tobacco use disorder Patient desires to quit smoking and using all tobacco products.  Discussed options of cutting back (patient is currently failing this method), nicotine patches, Chantix or Wellbutrin. Given psychiatric meds and history, will opt for nicotine patches.  Start with 21 mg patch daily for 28 days, then 14 mg patch daily for 28 days, then 7 mg patch for 28 days and stop.   - Followup 3 months    Discussion today >5 minutes (<10 minutes) specifically on counseling on risks of tobacco use, complications, treatment, smoking cessation as above.    Relevant Medications   nicotine (NICODERM CQ - DOSED IN MG/24 HOURS) 21 mg/24hr patch   nicotine (NICODERM CQ - DOSED IN MG/24 HOURS) 14 mg/24hr patch   nicotine (NICODERM CQ - DOSED IN MG/24 HR) 7 mg/24hr patch   Encounter for smoking cessation counseling - Primary See tobacco use disorder above   Relevant Medications   nicotine (NICODERM CQ - DOSED IN MG/24 HOURS) 21 mg/24hr patch   nicotine (NICODERM CQ - DOSED IN MG/24 HOURS) 14 mg/24hr patch   nicotine (NICODERM CQ - DOSED IN MG/24 HR) 7 mg/24hr patch    Other Visit Diagnoses    Gastrointestinal hemorrhage with melena       Relevant Orders   CBC with Differential/Platelet   COMPLETE METABOLIC PANEL WITH GFR   Ambulatory referral to Gastroenterology   Internal hemorrhoids     Patient notes he has internal hemorrhoids and regular GI bleeding with BM.  He has desire to have these addressed with GI referral and possible ligation.  Check labs today.  Referral placed.  Followup prn.   Relevant Orders   CBC with Differential/Platelet   COMPLETE METABOLIC PANEL WITH GFR   Ambulatory referral to Gastroenterology      Meds ordered this encounter  Medications  . nicotine (NICODERM CQ - DOSED IN MG/24 HOURS) 21 mg/24hr patch    Sig: Place 1 patch (21 mg total) onto the skin daily.    Dispense:  28 patch    Refill:  0    Order Specific Question:   Supervising Provider    Answer:   Smitty CordsKARAMALEGOS, ALEXANDER J [[id number]]  . nicotine (NICODERM CQ - DOSED IN MG/24 HOURS) 14 mg/24hr patch    Sig: Place 1 patch (14 mg total) onto the skin daily.    Dispense:  28 patch    Refill:  0    Order Specific Question:   Supervising Provider    Answer:   Smitty CordsKARAMALEGOS, ALEXANDER J [[id number]]  . nicotine (NICODERM CQ - DOSED IN MG/24 HR) 7 mg/24hr patch    Sig: Place 1 patch (7 mg total) onto the skin daily.     Dispense:  28 patch    Refill:  0    Order Specific Question:   Supervising Provider    Answer:   Smitty CordsKARAMALEGOS, ALEXANDER J [[id number]]  . tiZANidine (ZANAFLEX) 4 MG tablet    Sig: Take 1 tablet (4 mg total) by mouth every 8 (eight) hours as needed for muscle spasms.    Dispense:  90 tablet    Refill:  2    Order Specific Question:   Supervising Provider    Answer:   Smitty CordsKARAMALEGOS, ALEXANDER J [[id number]]    Follow up plan: Return in about 3 months (around 11/29/2017) for smoking cessation.  Wilhelmina McardleLauren Daimon Kean, DNP, AGPCNP-BC Adult Gerontology Primary Care Nurse Practitioner [facility] 08/29/2017, 10:01 AM

## 2017-08-29 NOTE — Patient Instructions (Addendum)
Jobe Markerodney W Kirschbaum,   Thank you for coming in to clinic today.  1. START nicotine 21 mg patch for 28 days 2. THEN reduce to 14 mg patch daily for 28 days 3. Then reduce to 7 mg patch daily for 28 days and followup with me   ** NO EXTRA smoking or dipping with nicotine patches **  4. STOP baclofen -START tizanidine 4 mg three times daily as needed for back pain and spasm.  Please schedule a follow-up appointment with Wilhelmina McardleLauren Mylisa Brunson, AGNP. Return in about 3 months (around 11/29/2017) for smoking cessation.  If you have any other questions or concerns, please feel free to call the clinic or send a message through MyChart. You may also schedule an earlier appointment if necessary.  You will receive a survey after today's visit either digitally by e-mail or paper by [employer] mail. Your experiences and feedback matter to us.  Please respond so we know how we are doing as we provide care for you.   Wilhelmina McardleLauren Scherrie Seneca, DNP, AGNP-BC Adult Gerontology Nurse Practitioner [facility], [facility]

## 2017-09-06 ENCOUNTER — Encounter: Payer: Self-pay | Admitting: *Deleted

## 2017-09-26 ENCOUNTER — Ambulatory Visit: Payer: Self-pay | Admitting: Gastroenterology

## 2017-10-17 IMAGING — CR DG CHEST 2V
1 series · 2 of 2 positions shown · non-contrast
Comparison: Radiographs 12/10/2013 and 03/02/2013.

CLINICAL DATA: Chronic pain with homicidal and suicidal ideations
for years.

EXAM:
CHEST  2 VIEW

[Series 1: dg chest 2 view · 0.14mm/px · 2 of 2 slices shown]
[im 1/2]
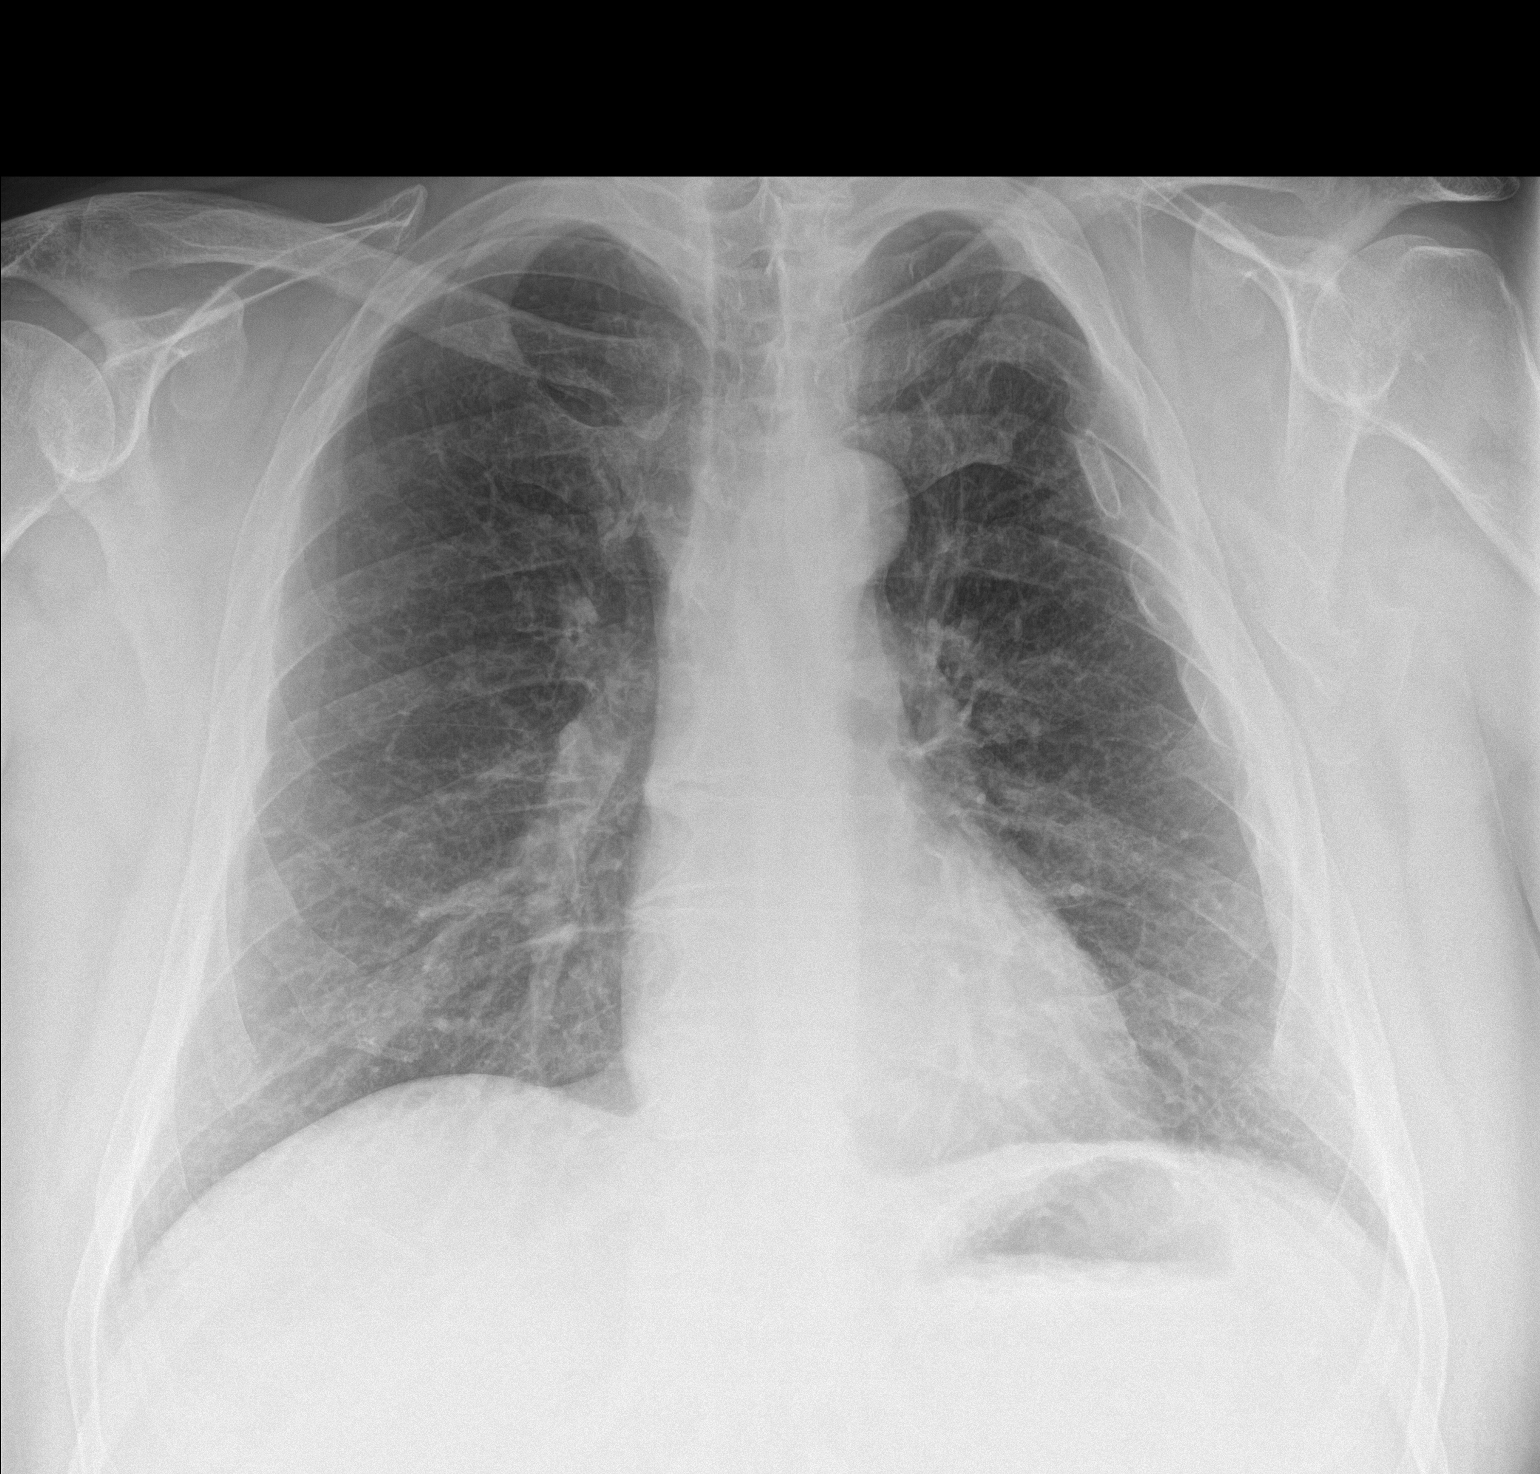
[im 2/2]
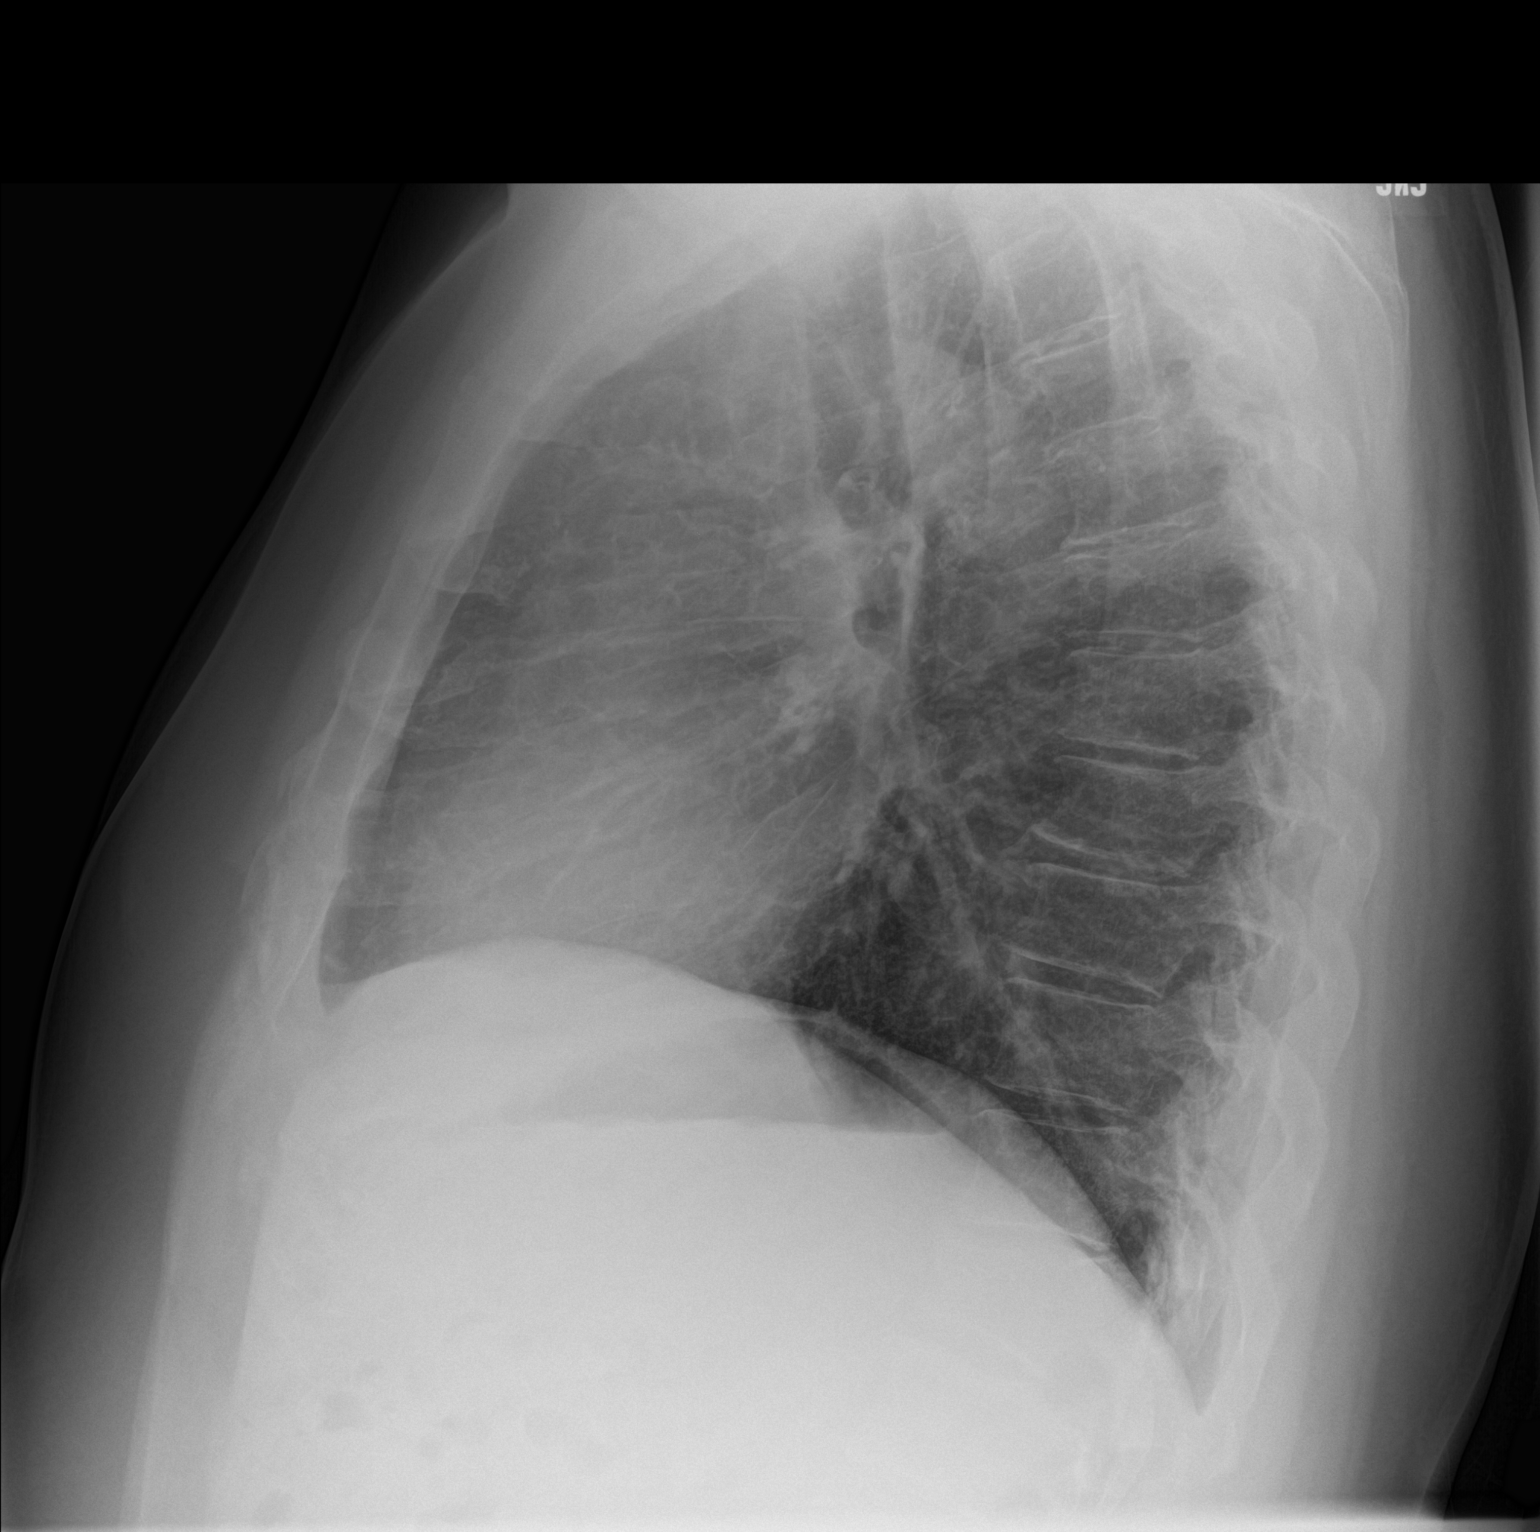

[2 of 2 positions shown; findings below may reference images not displayed]

FINDINGS: The heart size and mediastinal contours are stable. The lungs are
clear. Multiple deformities of the left ribs, clavicle and scapula
are again noted consistent with old healed fractures. No acute
osseous findings are seen.
IMPRESSION: Stable posttraumatic findings in the left chest. No acute
cardiopulmonary process.

## 2017-11-07 ENCOUNTER — Ambulatory Visit: Payer: Self-pay | Admitting: Gastroenterology

## 2017-11-07 ENCOUNTER — Encounter: Payer: Self-pay | Admitting: Gastroenterology

## 2017-11-07 DIAGNOSIS — K921 Melena: Secondary | ICD-10-CM

## 2017-11-28 ENCOUNTER — Encounter: Payer: Self-pay | Admitting: Nurse Practitioner

## 2017-12-01 ENCOUNTER — Ambulatory Visit: Payer: Self-pay | Admitting: Nurse Practitioner

## 2018-01-31 ENCOUNTER — Ambulatory Visit: Payer: Self-pay | Admitting: Nurse Practitioner

## 2018-02-06 ENCOUNTER — Other Ambulatory Visit: Payer: Self-pay

## 2018-02-06 ENCOUNTER — Encounter: Payer: Self-pay | Admitting: Nurse Practitioner

## 2018-02-06 ENCOUNTER — Ambulatory Visit (INDEPENDENT_AMBULATORY_CARE_PROVIDER_SITE_OTHER): Payer: Medicaid Other | Admitting: Nurse Practitioner

## 2018-02-06 VITALS — BP 131/81 | HR 105 | Temp 98.5°F | Ht 72.0 in | Wt 283.4 lb

## 2018-02-06 DIAGNOSIS — G894 Chronic pain syndrome: Secondary | ICD-10-CM

## 2018-02-06 DIAGNOSIS — Z23 Encounter for immunization: Secondary | ICD-10-CM | POA: Diagnosis not present

## 2018-02-06 DIAGNOSIS — F1911 Other psychoactive substance abuse, in remission: Secondary | ICD-10-CM

## 2018-02-06 DIAGNOSIS — I10 Essential (primary) hypertension: Secondary | ICD-10-CM

## 2018-02-06 DIAGNOSIS — Z716 Tobacco abuse counseling: Secondary | ICD-10-CM

## 2018-02-06 DIAGNOSIS — F17200 Nicotine dependence, unspecified, uncomplicated: Secondary | ICD-10-CM

## 2018-02-06 DIAGNOSIS — F1290 Cannabis use, unspecified, uncomplicated: Secondary | ICD-10-CM

## 2018-02-06 MED ORDER — NICOTINE 14 MG/24HR TD PT24: 14.0000 mg | MEDICATED_PATCH | Freq: Every day | TRANSDERMAL | 0 refills | Status: AC

## 2018-02-06 MED ORDER — NICOTINE 7 MG/24HR TD PT24: 7.0000 mg | MEDICATED_PATCH | Freq: Every day | TRANSDERMAL | 0 refills | Status: AC

## 2018-02-06 MED ORDER — NICOTINE 21 MG/24HR TD PT24: 21.0000 mg | MEDICATED_PATCH | Freq: Every day | TRANSDERMAL | 0 refills | Status: AC

## 2018-02-06 NOTE — Progress Notes (Signed)
Subjective:    Patient ID: Daniel [name], male    DOB: 28-Aug-1965, 52 y.o.   MRN: [id number]  Daniel MarkerRodney W Tobia is a 52 y.o. male presenting on 02/06/2018 for Back Pain and Nicotine Dependence   HPI Back [facility] for psychiatry and peer support, therapy. - [facility]  Celestia KhatHolly Fogleman is prescriber for addiction center.  - Patient was previously [place] by pain [place] in [place] due to marijuana use.  Patient admits he continues using marijuana.  He is using opioids obtained on street up to 3 days per week for pain relief.  Nicotine Dependence Patient continues to have difficulty with smoking cessation.  Is continuing to have nerves from relationship stressor which increased smoking again. - On 21 mg patch in past trial, patient had completely quit smoking cigarettes. - Once he had reduced to 14 mg - had cravings and would smoke 1-2 cigarettes per day.   - Once he reduced to 7 mg - Commitment to stopping was gone and he continued smoking with patches.  - "Smoking to me is a crutch for anxiety and feelings." - Currently states his plan will be to leave cigarettes in his [place] [place]iece[place] in future.  Interval history updates: - No more problems with blacking out since stopping bp med.  Social History   Tobacco Use  . Smoking status: Current Every Day Smoker    Packs/day: 1.00    Types: E-cigarettes, Cigarettes    Last attempt to quit: 03/16/2015    Years since quitting: 2.8  . Smokeless tobacco: Never Used  Substance Use Topics  . Alcohol use: No  . Drug use: No    Comment: Former use of cocaine and opioids    Review of Systems Per HPI unless specifically indicated above     Objective:    BP 131/81 (BP Location: Right Arm, Patient Position: Sitting, Cuff Size: Large)   Pulse (!) 105   Temp 98.5 F (36.9 C) (Oral)   Ht 6' (1.829 m)   Wt 283 lb 6.4 oz (128.5 kg)   BMI 38.44 kg/m   Wt Readings from Last 3 Encounters:  02/06/18 283 lb 6.4 oz  (128.5 kg)  08/29/17 288 lb 12.8 oz (131 kg)  05/31/17 289 lb (131.1 kg)    Physical Exam  Constitutional: He is oriented to person, place, and time. He appears well-developed and well-nourished. No distress.  HENT:  Head: Normocephalic and atraumatic.  Cardiovascular: Normal rate, regular rhythm, S1 normal, S2 normal, normal heart sounds and intact distal pulses.  Pulmonary/Chest: Effort normal and breath sounds normal. No respiratory distress.  Neurological: He is alert and oriented to person, place, and time.  Skin: Skin is warm and dry.  Psychiatric: He expresses impulsivity. He expresses no homicidal and no suicidal ideation. He expresses no suicidal plans and no homicidal plans.  Continues to have skin picking marks on face, is very restless in seat.  Avoids eye contact at times.  Anxious mood and affect.  Vitals reviewed.   Results for orders placed or performed during the hospital encounter of 04/15/16  Acetaminophen level  Result Value Ref Range   Acetaminophen (Tylenol), Serum <10 (L) 10 - 30 ug/mL  Comprehensive metabolic panel  Result Value Ref Range   Sodium 131 (L) 135 - 145 mmol/L   Potassium 3.5 3.5 - 5.1 mmol/L   Chloride 94 (L) 101 - 111 mmol/L   CO2 29 22 - 32 mmol/L   Glucose, Bld 147 (  H) 65 - 99 mg/dL   BUN 15 6 - 20 mg/dL   Creatinine, Ser 1.61 0.61 - 1.24 mg/dL   Calcium 8.4 (L) 8.9 - 10.3 mg/dL   Total Protein 7.8 6.5 - 8.1 g/dL   Albumin 4.1 3.5 - 5.0 g/dL   AST 40 15 - 41 U/L   ALT 60 17 - 63 U/L   Alkaline Phosphatase 75 38 - 126 U/L   Total Bilirubin 0.7 0.3 - 1.2 mg/dL   GFR calc non Af Amer >60 >60 mL/min   GFR calc Af Amer >60 >60 mL/min   Anion gap 8 5 - 15  Ethanol  Result Value Ref Range   Alcohol, Ethyl (B) <5 <5 mg/dL  CBC with Differential  Result Value Ref Range   WBC 8.2 3.8 - 10.6 K/uL   RBC 4.38 (L) 4.40 - 5.90 MIL/uL   Hemoglobin 13.4 13.0 - 18.0 g/dL   HCT 09.6 (L) 04.5 - 40.9 %   MCV 89.5 80.0 - 100.0 fL   MCH 30.5 26.0 -  34.0 pg   MCHC 34.1 32.0 - 36.0 g/dL   RDW 81.1 91.4 - 78.2 %   Platelets 220 150 - 440 K/uL   Neutrophils Relative % 77 %   Neutro Abs 6.2 1.4 - 6.5 K/uL   Lymphocytes Relative 15 %   Lymphs Abs 1.2 1.0 - 3.6 K/uL   Monocytes Relative 7 %   Monocytes Absolute 0.6 0.2 - 1.0 K/uL   Eosinophils Relative 1 %   Eosinophils Absolute 0.1 0 - 0.7 K/uL   Basophils Relative 0 %   Basophils Absolute 0.0 0 - 0.1 K/uL  Salicylate level  Result Value Ref Range   Salicylate Lvl <7.0 2.8 - 30.0 mg/dL  Urinalysis, Complete w Microscopic  Result Value Ref Range   Color, Urine YELLOW (A) YELLOW   APPearance CLEAR (A) CLEAR   Specific Gravity, Urine 1.016 1.005 - 1.030   pH 5.0 5.0 - 8.0   Glucose, UA NEGATIVE NEGATIVE mg/dL   Hgb urine dipstick NEGATIVE NEGATIVE   Bilirubin Urine NEGATIVE NEGATIVE   Ketones, ur NEGATIVE NEGATIVE mg/dL   Protein, ur NEGATIVE NEGATIVE mg/dL   Nitrite NEGATIVE NEGATIVE   Leukocytes, UA NEGATIVE NEGATIVE   RBC / HPF NONE SEEN 0 - 5 RBC/hpf   WBC, UA NONE SEEN 0 - 5 WBC/hpf   Bacteria, UA NONE SEEN NONE SEEN   Squamous Epithelial / LPF NONE SEEN NONE SEEN   Mucus PRESENT    Hyaline Casts, UA PRESENT   Urine Drug Screen, Qualitative  Result Value Ref Range   Tricyclic, Ur Screen POSITIVE (A) NONE DETECTED   Amphetamines, Ur Screen POSITIVE (A) NONE DETECTED   MDMA (Ecstasy)Ur Screen NONE DETECTED NONE DETECTED   Cocaine Metabolite,Ur [place] NONE DETECTED NONE DETECTED   Opiate, Ur Screen NONE DETECTED NONE DETECTED   Phencyclidine (PCP) Ur S NONE DETECTED NONE DETECTED   Cannabinoid 50 Ng, Ur [place] NONE DETECTED NONE DETECTED   Barbiturates, Ur Screen NONE DETECTED NONE DETECTED   Benzodiazepine, Ur Scrn POSITIVE (A) NONE DETECTED   Methadone Scn, Ur NONE DETECTED NONE DETECTED      Assessment & Plan:   Problem List Items Addressed This Visit      Cardiovascular and Mediastinum   Hypertension     Other   Chronic pain   Tobacco use disorder   Relevant  Medications   nicotine (NICODERM CQ - DOSED IN MG/24 HOURS) 21 mg/24hr patch  nicotine (NICODERM CQ - DOSED IN MG/24 HOURS) 14 mg/24hr patch (Start on 03/06/2018)   nicotine (NICODERM CQ - DOSED IN MG/24 HR) 7 mg/24hr patch (Start on 04/03/2018)   Encounter for smoking cessation counseling   Relevant Medications   nicotine (NICODERM CQ - DOSED IN MG/24 HOURS) 21 mg/24hr patch   nicotine (NICODERM CQ - DOSED IN MG/24 HOURS) 14 mg/24hr patch (Start on 03/06/2018)   nicotine (NICODERM CQ - DOSED IN MG/24 HR) 7 mg/24hr patch (Start on 04/03/2018)   Marijuana user    Other Visit Diagnoses    Needs flu shot    -  Primary   Relevant Orders   Flu Vaccine QUAD 6+ mos PF IM (Fluarix Quad PF)    # Tobacco use and smoking cessation Patient desires to quit smoking and using all tobacco products.  Discussed options of cutting back (patient is currently failing this method), nicotine patches (failed in last 6 months).  Chantix or Wellbutrin will continue to be avoided, given psychiatric meds and history.  Start with 21 mg patch daily for 28 days, then 14 mg patch daily for 28 days, then 7 mg patch for 28 days and stop.   - Encouraged patient to have plan for overcoming cravings.  Provided several options for anxiety management that are not medication options. - Followup 10 weeks.   Discussion today >5 minutes (<10 minutes) specifically on counseling on risks of tobacco use, complications, treatment, smoking cessation as above.  # Chronic pain, opioid use, marijuana use Patient with chronic pain and inability to continue with pain management 2/2 marijuana use.  Now using opioids without prescription.  He also has not recently gotten any gabapentin, admitted to using with opioids in past.   Plan: 1. Discussed firm boundaries with patient that gabapentin and muscle relaxers will not be re-prescribed 2/2 increased risk of accidental overdose. - Goal is to help patient work toward remission of opioid use.  If in  remission, can consider repeat use of muscle relaxer and/or gabapentin. - Patient currently at [facility] and is being recommended to repeat substance cessation program. 2. Follow-up prn.  Patient verbalizes understanding of all goals.  # Hypertension Improved without syncope or dizziness.  At goals today.  Continue off medications. Follow-up prn.   Meds ordered this encounter  Medications  . nicotine (NICODERM CQ - DOSED IN MG/24 HOURS) 21 mg/24hr patch    Sig: Place 1 patch (21 mg total) onto the skin daily for 28 days.    Dispense:  28 patch    Refill:  0    Order Specific Question:   Supervising Provider    Answer:   Smitty Cords [[id number]]  . nicotine (NICODERM CQ - DOSED IN MG/24 HOURS) 14 mg/24hr patch    Sig: Place 1 patch (14 mg total) onto the skin daily for 28 days.    Dispense:  28 patch    Refill:  0    Order Specific Question:   Supervising Provider    Answer:   Smitty Cords [[id number]]  . nicotine (NICODERM CQ - DOSED IN MG/24 HR) 7 mg/24hr patch    Sig: Place 1 patch (7 mg total) onto the skin daily for 28 days.    Dispense:  28 patch    Refill:  0    Order Specific Question:   Supervising Provider    Answer:   Smitty Cords [[id number]]    Follow up plan: Return in about 10 weeks (around 04/17/2018) for  smoking cessation.  Wilhelmina Mcardle, DNP, AGPCNP-BC Adult Gerontology Primary Care Nurse Practitioner [facility] 02/06/2018, 3:11 PM

## 2018-02-06 NOTE — Patient Instructions (Addendum)
Daniel [name],   Thank you for coming in to clinic today.  1. Repeat nicotine patches.   1st month: place a 21 mg patch daily and change daily. 2nd month: place a 14 mg patch and change daily.  - dig deep to plan for overcoming your cravings.  Work hard to manage stress in healthy way before reducing your dose. 3rd month: place a 7 mg patch and change daily.   Can continue if needed.  2. No additional medications for back pain with current marijuana and opioid use.  Continue follow-up with [facility] and [facility].  Please schedule a follow-up appointment with Wilhelmina Mcardle, AGNP. Return in about 10 weeks (around 04/17/2018) for smoking cessation.  If you have any other questions or concerns, please feel free to call the clinic or send a message through MyChart. You may also schedule an earlier appointment if necessary.  You will receive a survey after today's visit either digitally by e-mail or paper by [employer]. Your experiences and feedback matter to [place].  Please respond so we know how we are doing as we provide care for you.   Wilhelmina Mcardle, DNP, AGNP-BC Adult Gerontology Nurse Practitioner [facility], [facility]   Coping with Quitting Smoking Quitting smoking is a physical and mental challenge. You will face cravings, withdrawal symptoms, and temptation. Before quitting, work with your health care provider to make a plan that can help you cope. Preparation can help you quit and keep you from giving in. How can I cope with cravings? Cravings usually last for 5-10 minutes. If you get through it, the craving will pass. Consider taking the following actions to help you cope with cravings:  Keep your mouth busy: ? Chew sugar-free gum. ? Suck on hard candies or a straw. ? Brush your teeth.  Keep your hands and body busy: ? Immediately change to a different activity when you feel a craving. ? Squeeze or play with a ball. ? Do an activity or a hobby, like  making bead jewelry, practicing needlepoint, or working with wood. ? Mix up your normal routine. ? Take a short exercise break. Go for a quick walk or run up and down stairs. ? Spend time in public places where smoking is not allowed.  Focus on doing something kind or helpful for someone else.  Call a friend or family member to talk during a craving.  Join a support group.  Call a quit line, such as 1-800-QUIT-NOW.  Talk with your health care provider about medicines that might help you cope with cravings and make quitting easier for you.  How can I deal with withdrawal symptoms? Your body may experience negative effects as it tries to get used to not having nicotine in the system. These effects are called withdrawal symptoms. They may include:  Feeling hungrier than normal.  Trouble concentrating.  Irritability.  Trouble sleeping.  Feeling depressed.  Restlessness and agitation.  Craving a cigarette.  To manage withdrawal symptoms:  Avoid places, people, and activities that trigger your cravings.  Remember why you want to quit.  Get plenty of sleep.  Avoid coffee and other caffeinated drinks. These may worsen some of your symptoms.  How can I handle social situations? Social situations can be difficult when you are quitting smoking, especially in the first few weeks. To manage this, you can:  Avoid parties, bars, and other social situations where people might be smoking.  Avoid alcohol.  Leave right away if you  have the urge to smoke.  Explain to your family and friends that you are quitting smoking. Ask for understanding and support.  Plan activities with friends or family where smoking is not an option.  What are some ways I can cope with stress? Wanting to smoke may cause stress, and stress can make you want to smoke. Find ways to manage your stress. Relaxation techniques can help. For example:  Breathe slowly and deeply, in through your nose and out  through your mouth.  Listen to soothing, relaxing music.  Talk with a family member or friend about your stress.  Light a candle.  Soak in a bath or take a shower.  Think about a peaceful place.  What are some ways I can prevent weight gain? Be aware that many people gain weight after they quit smoking. However, not everyone does. To keep from gaining weight, have a plan in place before you quit and stick to the plan after you quit. Your plan should include:  Having healthy snacks. When you have a craving, it may help to: ? Eat plain popcorn, crunchy carrots, celery, or other cut vegetables. ? Chew sugar-free gum.  Changing how you eat: ? Eat small portion sizes at meals. ? Eat 4-6 small meals throughout the day instead of 1-2 large meals a day. ? Be mindful when you eat. Do not watch television or do other things that might distract you as you eat.  Exercising regularly: ? Make time to exercise each day. If you do not have time for a long workout, do short bouts of exercise for 5-10 minutes several times a day. ? Do some form of strengthening exercise, like weight lifting, and some form of aerobic exercise, like running or swimming.  Drinking plenty of water or other low-calorie or no-calorie drinks. Drink 6-8 glasses of water daily, or as much as instructed by your health care provider.  Summary  Quitting smoking is a physical and mental challenge. You will face cravings, withdrawal symptoms, and temptation to smoke again. Preparation can help you as you go through these challenges.  You can cope with cravings by keeping your mouth busy (such as by chewing gum), keeping your body and hands busy, and making calls to family, friends, or a helpline for people who want to quit smoking.  You can cope with withdrawal symptoms by avoiding places where people smoke, avoiding drinks with caffeine, and getting plenty of rest.  Ask your health care provider about the different ways to  prevent weight gain, avoid stress, and handle social situations. This information is not intended to replace advice given to you by your health care provider. Make sure you discuss any questions you have with your health care provider. Document Released: 02/13/2016 Document Revised: 02/13/2016 Document Reviewed: 02/13/2016 Elsevier Interactive Patient Education  [employer]2018 Elsevier Inc.

## 2018-02-28 ENCOUNTER — Ambulatory Visit: Payer: Self-pay | Admitting: Nurse Practitioner

## 2018-07-14 ENCOUNTER — Emergency Department: Payer: Medicaid Other

## 2018-07-14 ENCOUNTER — Emergency Department
Admission: EM | Admit: 2018-07-14 | Discharge: 2018-07-14 | Disposition: A | Discharge status: Other Acute Inpatient Hospital | Payer: Medicaid Other | Attending: Emergency Medicine | Admitting: Emergency Medicine

## 2018-07-14 ENCOUNTER — Other Ambulatory Visit: Payer: Self-pay

## 2018-07-14 ENCOUNTER — Inpatient Hospital Stay
Admission: RE | Admit: 2018-07-14 | Discharge: 2018-07-19 | DRG: 885 | Disposition: A | Discharge status: Home / Self Care | Payer: Medicaid Other | Source: Ambulatory Visit | Attending: Psychiatry | Admitting: Psychiatry

## 2018-07-14 DIAGNOSIS — Y929 Unspecified place or not applicable: Secondary | ICD-10-CM | POA: Insufficient documentation

## 2018-07-14 DIAGNOSIS — G4700 Insomnia, unspecified: Secondary | ICD-10-CM | POA: Diagnosis present

## 2018-07-14 DIAGNOSIS — E1165 Type 2 diabetes mellitus with hyperglycemia: Secondary | ICD-10-CM | POA: Diagnosis present

## 2018-07-14 DIAGNOSIS — Y999 Unspecified external cause status: Secondary | ICD-10-CM | POA: Diagnosis not present

## 2018-07-14 DIAGNOSIS — F329 Major depressive disorder, single episode, unspecified: Secondary | ICD-10-CM | POA: Diagnosis present

## 2018-07-14 DIAGNOSIS — I10 Essential (primary) hypertension: Secondary | ICD-10-CM | POA: Diagnosis present

## 2018-07-14 DIAGNOSIS — Z79899 Other long term (current) drug therapy: Secondary | ICD-10-CM | POA: Insufficient documentation

## 2018-07-14 DIAGNOSIS — E119 Type 2 diabetes mellitus without complications: Secondary | ICD-10-CM

## 2018-07-14 DIAGNOSIS — T50902A Poisoning by unspecified drugs, medicaments and biological substances, intentional self-harm, initial encounter: Secondary | ICD-10-CM | POA: Diagnosis present

## 2018-07-14 DIAGNOSIS — E876 Hypokalemia: Secondary | ICD-10-CM | POA: Diagnosis present

## 2018-07-14 DIAGNOSIS — T1491XA Suicide attempt, initial encounter: Secondary | ICD-10-CM | POA: Insufficient documentation

## 2018-07-14 DIAGNOSIS — Z046 Encounter for general psychiatric examination, requested by authority: Secondary | ICD-10-CM | POA: Insufficient documentation

## 2018-07-14 DIAGNOSIS — Y9389 Activity, other specified: Secondary | ICD-10-CM | POA: Diagnosis not present

## 2018-07-14 DIAGNOSIS — Z794 Long term (current) use of insulin: Secondary | ICD-10-CM | POA: Diagnosis not present

## 2018-07-14 DIAGNOSIS — Z1159 Encounter for screening for other viral diseases: Secondary | ICD-10-CM | POA: Diagnosis not present

## 2018-07-14 DIAGNOSIS — F17210 Nicotine dependence, cigarettes, uncomplicated: Secondary | ICD-10-CM | POA: Diagnosis present

## 2018-07-14 DIAGNOSIS — Z915 Personal history of self-harm: Secondary | ICD-10-CM

## 2018-07-14 DIAGNOSIS — F332 Major depressive disorder, recurrent severe without psychotic features: Secondary | ICD-10-CM | POA: Diagnosis present

## 2018-07-14 DIAGNOSIS — F1410 Cocaine abuse, uncomplicated: Secondary | ICD-10-CM | POA: Diagnosis present

## 2018-07-14 DIAGNOSIS — F321 Major depressive disorder, single episode, moderate: Secondary | ICD-10-CM

## 2018-07-14 DIAGNOSIS — F322 Major depressive disorder, single episode, severe without psychotic features: Principal | ICD-10-CM | POA: Diagnosis present

## 2018-07-14 DIAGNOSIS — X838XXA Intentional self-harm by other specified means, initial encounter: Secondary | ICD-10-CM | POA: Insufficient documentation

## 2018-07-14 DIAGNOSIS — Z9111 Patient's noncompliance with dietary regimen: Secondary | ICD-10-CM

## 2018-07-14 LAB — COMPREHENSIVE METABOLIC PANEL WITH GFR
ALT: 73 U/L — ABNORMAL HIGH (ref 0–44)
AST: 39 U/L (ref 15–41)
Albumin: 4.1 g/dL (ref 3.5–5.0)
Alkaline Phosphatase: 98 U/L (ref 38–126)
Anion gap: 11 (ref 5–15)
BUN: 6 mg/dL (ref 6–20)
CO2: 23 mmol/L (ref 22–32)
Calcium: 9 mg/dL (ref 8.9–10.3)
Chloride: 105 mmol/L (ref 98–111)
Creatinine, Ser: 0.7 mg/dL (ref 0.61–1.24)
GFR calc Af Amer: >60 mL/min
GFR calc non Af Amer: >60 mL/min
Glucose, Bld: 343 mg/dL — ABNORMAL HIGH (ref 70–99)
Potassium: 3.3 mmol/L — ABNORMAL LOW (ref 3.5–5.1)
Sodium: 139 mmol/L (ref 135–145)
Total Bilirubin: 0.5 mg/dL (ref 0.3–1.2)
Total Protein: 7.7 g/dL (ref 6.5–8.1)

## 2018-07-14 LAB — CBC WITH DIFFERENTIAL/PLATELET
Abs Immature Granulocytes: 0.01 10*3/uL (ref 0.00–0.07)
Basophils Absolute: 0 10*3/uL (ref 0.0–0.1)
Basophils Relative: 0 %
Eosinophils Absolute: 0 10*3/uL (ref 0.0–0.5)
Eosinophils Relative: 0 %
HCT: 42.7 % (ref 39.0–52.0)
Hemoglobin: 15 g/dL (ref 13.0–17.0)
Immature Granulocytes: 0 %
Lymphocytes Relative: 24 %
Lymphs Abs: 2.1 10*3/uL (ref 0.7–4.0)
MCH: 30.7 pg (ref 26.0–34.0)
MCHC: 35.1 g/dL (ref 30.0–36.0)
MCV: 87.3 fL (ref 80.0–100.0)
Monocytes Absolute: 0.4 10*3/uL (ref 0.1–1.0)
Monocytes Relative: 5 %
Neutro Abs: 6 10*3/uL (ref 1.7–7.7)
Neutrophils Relative %: 71 %
Platelets: 224 10*3/uL (ref 150–400)
RBC: 4.89 MIL/uL (ref 4.22–5.81)
RDW: 12.4 % (ref 11.5–15.5)
WBC: 8.5 10*3/uL (ref 4.0–10.5)
nRBC: 0 % (ref 0.0–0.2)

## 2018-07-14 LAB — BLOOD GAS, ARTERIAL
Acid-Base Excess: 0.1 mmol/L (ref 0.0–2.0)
Bicarbonate: 23.6 mmol/L (ref 20.0–28.0)
FIO2: 0.21
O2 Saturation: 95.4 %
Patient temperature: 37
pCO2 arterial: 34 mmHg (ref 32.0–48.0)
pH, Arterial: 7.45 (ref 7.350–7.450)
pO2, Arterial: 74 mmHg — ABNORMAL LOW (ref 83.0–108.0)

## 2018-07-14 LAB — GLUCOSE, CAPILLARY
Glucose-Capillary: 515 mg/dL (ref 70–99)
Glucose-Capillary: 470 mg/dL — ABNORMAL HIGH (ref 70–99)
Glucose-Capillary: 428 mg/dL — ABNORMAL HIGH (ref 70–99)

## 2018-07-14 LAB — TROPONIN I: Troponin I: <0.03 ng/mL (ref <0.03)

## 2018-07-14 LAB — ETHANOL: Alcohol, Ethyl (B): <10 mg/dL (ref <10)

## 2018-07-14 LAB — SALICYLATE LEVEL
Salicylate Lvl: <7 mg/dL (ref 2.8–30.0)
Salicylate Lvl: <7 mg/dL (ref 2.8–30.0)

## 2018-07-14 LAB — MAGNESIUM
Magnesium: 1.9 mg/dL (ref 1.7–2.4)
Magnesium: 2 mg/dL (ref 1.7–2.4)
Magnesium: 2.3 mg/dL (ref 1.7–2.4)

## 2018-07-14 LAB — HEMOGLOBIN A1C
Hgb A1c MFr Bld: 10.4 % — ABNORMAL HIGH (ref 4.8–5.6)
Mean Plasma Glucose: 251.78 mg/dL

## 2018-07-14 LAB — ACETAMINOPHEN LEVEL
Acetaminophen (Tylenol), Serum: <10 ug/mL — ABNORMAL LOW (ref 10–30)
Acetaminophen (Tylenol), Serum: <10 ug/mL — ABNORMAL LOW (ref 10–30)

## 2018-07-14 LAB — SARS CORONAVIRUS 2 BY RT PCR (HOSPITAL ORDER, PERFORMED IN ~~LOC~~ HOSPITAL LAB): SARS Coronavirus 2: NEGATIVE

## 2018-07-14 MED ORDER — POTASSIUM CHLORIDE CRYS ER 20 MEQ PO TBCR
40.0000 meq | EXTENDED_RELEASE_TABLET | Freq: Once | ORAL | Status: AC
  Administered 2018-07-14: 40 meq via ORAL
  Filled 2018-07-14: qty 2

## 2018-07-14 MED ORDER — NICOTINE 21 MG/24HR TD PT24
21.0000 mg | MEDICATED_PATCH | Freq: Every day | TRANSDERMAL | Status: DC
  Administered 2018-07-14 – 2018-07-19 (×6): 21 mg via TRANSDERMAL
  Filled 2018-07-14 (×6): qty 1

## 2018-07-14 MED ORDER — INSULIN ASPART PROT & ASPART (70-30 MIX) 100 UNIT/ML ~~LOC~~ SUSP
25.0000 [IU] | Freq: Once | SUBCUTANEOUS | Status: AC
  Administered 2018-07-14: 18:00:00 25 [IU] via SUBCUTANEOUS
  Filled 2018-07-14: qty 10

## 2018-07-14 MED ORDER — MAGNESIUM SULFATE 2 GM/50ML IV SOLN
2.0000 g | Freq: Once | INTRAVENOUS | Status: AC
  Administered 2018-07-14: 2 g via INTRAVENOUS
  Filled 2018-07-14: qty 50

## 2018-07-14 MED ORDER — DIPHENHYDRAMINE HCL 25 MG PO CAPS
25.0000 mg | ORAL_CAPSULE | Freq: Four times a day (QID) | ORAL | Status: DC | PRN
  Administered 2018-07-18: 25 mg via ORAL
  Filled 2018-07-14: qty 1

## 2018-07-14 MED ORDER — ALUM & MAG HYDROXIDE-SIMETH 200-200-20 MG/5ML PO SUSP: 30.0000 mL | ORAL | Status: DC | PRN

## 2018-07-14 MED ORDER — MAGNESIUM HYDROXIDE 400 MG/5ML PO SUSP: 30.0000 mL | Freq: Every day | ORAL | Status: DC | PRN

## 2018-07-14 MED ORDER — SODIUM CHLORIDE 0.9 % IV BOLUS
1000.0000 mL | Freq: Once | INTRAVENOUS | Status: AC
  Administered 2018-07-14: 1000 mL via INTRAVENOUS

## 2018-07-14 MED ORDER — INSULIN ASPART 100 UNIT/ML ~~LOC~~ SOLN
0.0000 [IU] | Freq: Three times a day (TID) | SUBCUTANEOUS | Status: DC
  Administered 2018-07-14: 9 [IU] via SUBCUTANEOUS

## 2018-07-14 MED ORDER — INSULIN ASPART 100 UNIT/ML ~~LOC~~ SOLN
0.0000 [IU] | Freq: Three times a day (TID) | SUBCUTANEOUS | Status: DC
  Administered 2018-07-15 – 2018-07-16 (×5): 11 [IU] via SUBCUTANEOUS
  Administered 2018-07-16 – 2018-07-17 (×2): 15 [IU] via SUBCUTANEOUS
  Administered 2018-07-17: 11:00:00 7 [IU] via SUBCUTANEOUS
  Administered 2018-07-17: 20 [IU] via SUBCUTANEOUS
  Administered 2018-07-18: 7 [IU] via SUBCUTANEOUS
  Administered 2018-07-18 – 2018-07-19 (×3): 11 [IU] via SUBCUTANEOUS
  Administered 2018-07-19: 09:00:00 15 [IU] via SUBCUTANEOUS
  Filled 2018-07-14 (×5): qty 1

## 2018-07-14 MED ORDER — INSULIN ASPART 100 UNIT/ML ~~LOC~~ SOLN
0.0000 [IU] | Freq: Every day | SUBCUTANEOUS | Status: DC
  Administered 2018-07-14: 5 [IU] via SUBCUTANEOUS
  Administered 2018-07-15 (×2): 4 [IU] via SUBCUTANEOUS
  Administered 2018-07-16 – 2018-07-18 (×3): 3 [IU] via SUBCUTANEOUS
  Filled 2018-07-14 (×5): qty 1

## 2018-07-14 MED ORDER — POTASSIUM CHLORIDE 20 MEQ PO PACK
40.0000 meq | PACK | Freq: Once | ORAL | Status: AC
  Administered 2018-07-14: 40 meq via ORAL
  Filled 2018-07-14: qty 2

## 2018-07-14 MED ORDER — SODIUM CHLORIDE 0.9 % IV BOLUS: 1000.0000 mL | Freq: Once | INTRAVENOUS | Status: DC

## 2018-07-14 MED ORDER — TEMAZEPAM 15 MG PO CAPS: 15.0000 mg | ORAL_CAPSULE | Freq: Every evening | ORAL | Status: DC | PRN

## 2018-07-14 NOTE — Progress Notes (Signed)
Family Meeting Note  Advance Directive:yes  Today a meeting took place with the Patient.  Patient is able to participate   The following clinical team members were present during this meeting:MD  The following were discussed:Patient's diagnosis:53 y.o. male with a known history per below, presented to the emergency room with acute depression and cocaine abuse, patient currently in behavioral health unit, hospitalist asked to evaluate for hyperglycemia-on admission was 343, on the unit patient's blood sugar was greater than 500, patient evaluated on the floor, denied any history of diabetes, patient stated over the last several weeks he has noted blurry vision intermittently, increased thirst, and urinary frequency, patient is now being evaluated for acute hyperglycemia-suspected due to newly diagnosed diabetes mellitus, Patient's progosis: Unable to determine and Goals for treatment: Full Code  Additional follow-up to be provided: prn  Time spent during discussion:20 minutes  Bertrum Sol, MD
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
## 2018-07-14 NOTE — ED Notes (Signed)
Pt to room 25 via w/c to be placed on card monitor for EKG and further evaluation by EDT Ian Malkin; charge nurse notified

## 2018-07-14 NOTE — ED Notes (Signed)
Per Dr Marisa Severin pt is medically cleared

## 2018-07-14 NOTE — H&P (Signed)
[facility] [place] [facility]   PATIENT NAME: Daniel [name]    MR#:  [id number]  DATE OF BIRTH:  [birth year]  DATE OF ADMISSION:  07/14/2018  PRIMARY CARE PHYSICIAN: System, Pcp Not In   REQUESTING/REFERRING PHYSICIAN:   CHIEF COMPLAINT: High blood sugars  HISTORY OF PRESENT ILLNESS: Daniel [name]  is a 53 y.o. male with a known history per below, presented to the emergency room with acute depression and cocaine abuse, patient currently in behavioral health unit, hospitalist asked to evaluate for hyperglycemia-on admission was 343, on the unit patient's blood sugar was greater than 500, patient evaluated on the floor, denied any history of diabetes, patient stated over the last several weeks he has noted blurry vision intermittently, increased thirst, and urinary frequency, patient is now being evaluated for acute hyperglycemia-suspected due to newly diagnosed diabetes mellitus.  PAST MEDICAL HISTORY:   Past Medical History:  Diagnosis Date  . Allergy   . Anxiety   . Back pain   . Chronic pain    2/2 accident/trauma  . Hypertension   . Opioid abuse (HCC)   . Urinary retention     PAST SURGICAL HISTORY:  Past Surgical History:  Procedure Laterality Date  . BACK SURGERY    . KNEE SURGERY Left     SOCIAL HISTORY:  Social History   Tobacco Use  . Smoking status: Current Every Day Smoker    Packs/day: 1.00    Types: E-cigarettes, Cigarettes    Last attempt to quit: 03/16/2015    Years since quitting: 3.3  . Smokeless tobacco: Never Used  Substance Use Topics  . Alcohol use: No    FAMILY HISTORY:  Family History  Problem Relation Age of Onset  . Alcohol abuse Father     DRUG ALLERGIES:  Allergies  Allergen Reactions  . Hydrocodone-Acetaminophen Hives    Vicodin/Norco  . Suboxone [Buprenorphine Hcl-Naloxone Hcl] Other (See Comments)    Urinary retention    REVIEW OF SYSTEMS:   CONSTITUTIONAL: No fever, fatigue or weakness.  EYES:  Intermittent blurry vision, no double vision.  EARS, NOSE, AND THROAT: No tinnitus or ear pain.  RESPIRATORY: No cough, shortness of breath, wheezing or hemoptysis.  CARDIOVASCULAR: No chest pain, orthopnea, edema.  GASTROINTESTINAL: No nausea, vomiting, diarrhea or abdominal pain.  GENITOURINARY: No dysuria, hematuria.  ENDOCRINE: + polyuria, nocturia,  HEMATOLOGY: No anemia, easy bruising or bleeding SKIN: No rash or lesion. MUSCULOSKELETAL: No joint pain or arthritis.   NEUROLOGIC: No tingling, numbness, weakness.  PSYCHIATRY: No anxiety or depression.   MEDICATIONS AT HOME:  Prior to Admission medications   Medication Sig Start Date End Date Taking? Authorizing Provider  amitriptyline (ELAVIL) 50 MG tablet Take 200 mg by mouth at bedtime. Reported on 07/25/2015. On Hold at Pharmacy     [provider]  baclofen (LIORESAL) 10 MG tablet Take 1 tablet (10 mg total) by mouth 3 (three) times daily. 07/27/17   Galen ManilaKennedy, Lauren Renee, NP  busPIRone (BUSPAR) 15 MG tablet Take 15 mg by mouth 3 (three) times daily. 09/23/16   [provider]  escitalopram (LEXAPRO) 20 MG tablet Take by mouth.    [provider]  tiZANidine (ZANAFLEX) 4 MG tablet Take 1 tablet (4 mg total) by mouth every 8 (eight) hours as needed for muscle spasms. Patient not taking: Reported on 02/06/2018 08/29/17   Galen ManilaKennedy, Lauren Renee, NP  traZODone (DESYREL) 100 MG tablet take 2 tablet by mouth at bedtime 10/08/16   [provider]      PHYSICAL EXAMINATION:   VITAL SIGNS: Blood pressure 128/89, pulse 93, temperature 98.2 F (36.8 C), temperature source Oral, resp. rate 18, height 6' (1.829 m), weight 113.4 kg, SpO2 97 %.  GENERAL:  53 y.o.-year-old patient lying in the bed with no acute distress.  Overweight, nontoxic-appearing EYES: Pupils equal, round, reactive to light and accommodation. No scleral icterus. Extraocular muscles intact.  HEENT: Head atraumatic, normocephalic. Oropharynx  and nasopharynx clear.  NECK:  Supple, no jugular venous distention. No thyroid enlargement, no tenderness.  LUNGS: Normal breath sounds bilaterally, no wheezing, rales,rhonchi or crepitation. No use of accessory muscles of respiration.  CARDIOVASCULAR: S1, S2 normal. No murmurs, rubs, or gallops.  ABDOMEN: Soft, nontender, nondistended. Bowel sounds present. No organomegaly or mass.  EXTREMITIES: No pedal edema, cyanosis, or clubbing.  NEUROLOGIC: Cranial nerves II through XII are intact. Muscle strength 5/5 in all extremities. Sensation intact. Gait not checked.  PSYCHIATRIC: The patient is alert and oriented x 3.  SKIN: No obvious rash, lesion, or ulcer.   LABORATORY PANEL:   CBC Recent Labs  Lab 07/14/18 0328  WBC 8.5  HGB 15.0  HCT 42.7  PLT 224  MCV 87.3  MCH 30.7  MCHC 35.1  RDW 12.4  LYMPHSABS 2.1  MONOABS 0.4  EOSABS 0.0  BASOSABS 0.0   ------------------------------------------------------------------------------------------------------------------  Chemistries  Recent Labs  Lab 07/14/18 0328 07/14/18 0616  NA 139  --   K 3.3*  --   CL 105  --   CO2 23  --   GLUCOSE 343*  --   BUN 6  --   CREATININE 0.70  --   CALCIUM 9.0  --   MG 1.9 2.0  AST 39  --   ALT 73*  --   ALKPHOS 98  --   BILITOT 0.5  --    ------------------------------------------------------------------------------------------------------------------ estimated creatinine clearance is 135.6 mL/min (by C-G formula based on SCr of 0.7 mg/dL). ------------------------------------------------------------------------------------------------------------------ No results for input(s): TSH, T4TOTAL, T3FREE, THYROIDAB in the last 72 hours.  Invalid input(s): FREET3   Coagulation profile No results for input(s): INR, PROTIME in the last 168 hours. ------------------------------------------------------------------------------------------------------------------- No results for input(s): DDIMER  in the last 72 hours. -------------------------------------------------------------------------------------------------------------------  Cardiac Enzymes Recent Labs  Lab 07/14/18 0328  TROPONINI <0.03   ------------------------------------------------------------------------------------------------------------------ Invalid input(s): POCBNP  ---------------------------------------------------------------------------------------------------------------  Urinalysis    Component Value Date/Time   COLORURINE YELLOW (A) 04/15/2016 1425   APPEARANCEUR CLEAR (A) 04/15/2016 1425   APPEARANCEUR Clear 08/27/2013 1257   LABSPEC 1.016 04/15/2016 1425   LABSPEC 1.012 08/27/2013 1257   PHURINE 5.0 04/15/2016 1425   GLUCOSEU NEGATIVE 04/15/2016 1425   GLUCOSEU Negative 08/27/2013 1257   HGBUR NEGATIVE 04/15/2016 1425   BILIRUBINUR NEGATIVE 04/15/2016 1425   BILIRUBINUR Negative 08/27/2013 1257   KETONESUR NEGATIVE 04/15/2016 1425   PROTEINUR NEGATIVE 04/15/2016 1425   NITRITE NEGATIVE 04/15/2016 1425   LEUKOCYTESUR NEGATIVE 04/15/2016 1425   LEUKOCYTESUR Negative 08/27/2013 1257     RADIOLOGY: Dg Chest Port 1 View  Result Date: 07/14/2018 CLINICAL DATA:  Overdose. EXAM: PORTABLE CHEST 1 VIEW COMPARISON:  Radiograph 02/24/2015, CT 08/03/2012 FINDINGS: The cardiomediastinal contours are normal. Mild chronic bronchial thickening. Pulmonary vasculature is normal. No consolidation, pleural effusion, or pneumothorax. No acute osseous abnormalities are seen. Remote left rib fractures. Remote left clavicle fracture. IMPRESSION: No acute chest findings. Electronically Signed   By: Narda Rutherford M.D.   On: 07/14/2018 03:26    EKG: Orders placed or performed during the  hospital encounter of 07/14/18  . EKG 12-Lead    IMPRESSION AND PLAN: *Acute hyperglycemia With history of polyuria, intermittent blurry vision, nocturia Suspected due to newly diagnosed diabetes mellitus type 2 Start  high-dose sliding scale insulin with Accu-Cheks per routine, Accu-Cheks every 4, encourage p.o. water intake, check hemoglobin A1c determine level control, carbohydrate consistent diet, give single dose of insulin 7030-25 units x 1 now, diabetic educator while in house, and continue close medical monitoring  *Acute hypokalemia Replete with p.o. potassium, check magnesium level  *History of hypertension Normotensive currently vitals per routine, make changes as per necessary  *Acute dual diagnosis with depression and substance abuse Management per primary service  *History of urinary retention Stable Conservative medical management  All the records are reviewed and case discussed with ED provider. Management plans discussed with the patient, family and they are in agreement.  CODE STATUS:full    Code Status Orders  (From admission, onward)         Start     Ordered   07/14/18 1136  Full code  Continuous     07/14/18 1135        Code Status History    Date Active Date Inactive Code Status Order ID Comments User Context   06/20/2015 1117 06/24/2015 1750 Full Code [id number]  Audery Amel, MD Inpatient   06/19/2015 0616 06/20/2015 1117 Full Code [id number]  Arnaldo Natal, MD ED   03/11/2015 1755 03/19/2015 1607 Full Code [id number]  Shari Prows, MD Inpatient   02/24/2015 1620 02/28/2015 1534 Full Code [id number]  Audery Amel, MD Inpatient   05/08/2014 0954 05/09/2014 0347 Full Code [id number]  Coletta Memos, MD HOV       TOTAL TIME TAKING CARE OF THIS PATIENT: 35 minutes.    Evelena Asa Marchello Rothgeb M.D on 07/14/2018   Between 7am to 6pm - Pager - [phone]  After 6pm go to [email or web address] - password [email or web address]  [facility]  Office  [phone]  CC: Primary care physician; System, Pcp Not In   Note: This dictation was prepared with Dragon dictation along with smaller phrase technology. Any transcriptional errors that result from this process are  unintentional.

## 2018-07-14 NOTE — ED Notes (Signed)
SW is at pt's bedside at this time

## 2018-07-14 NOTE — BHH Group Notes (Signed)
LCSW Group Therapy Note  07/14/2018 12:25 PM  Type of Therapy and Topic:  Group Therapy:  Feelings around Relapse and Recovery  Participation Level:  Did Not Attend   Description of Group:    Patients in this group will discuss emotions they experience before and after a relapse. They will process how experiencing these feelings, or avoidance of experiencing them, relates to having a relapse. Facilitator will guide patients to explore emotions they have related to recovery. Patients will be encouraged to process which emotions are more powerful. They will be guided to discuss the emotional reaction significant others in their lives may have to their relapse or recovery. Patients will be assisted in exploring ways to respond to the emotions of others without this contributing to a relapse.  Therapeutic Goals: 1. Patient will identify two or more emotions that lead to a relapse for them 2. Patient will identify two emotions that result when they relapse 3. Patient will identify two emotions related to recovery 4. Patient will demonstrate ability to communicate their needs through discussion and/or role plays   Summary of Patient Progress: x    Therapeutic Modalities:   Cognitive Behavioral Therapy Solution-Focused Therapy Assertiveness Training Relapse Prevention Therapy   Iris Pert, MSW, LCSW Clinical Social Work 07/14/2018 12:25 PM

## 2018-07-14 NOTE — ED Triage Notes (Addendum)
Patient ambulatory to triage with unsteady gait--placed in w/c; pt reports "took a bunch of pills, I want to die"; st took trazodone 100mg  and hydroxyzine 25mg PTA "a whole bunch"; pt slumping forward in w/c, jerky movements of upper extremities noted

## 2018-07-14 NOTE — ED Notes (Signed)
Dr Marisa Severin notified of poison control recommendations - EKG performed

## 2018-07-14 NOTE — BHH Suicide Risk Assessment (Signed)
[facility] Admission Suicide Risk Assessment   Nursing information obtained from:    Demographic factors:    Current Mental Status:    Loss Factors:    Historical Factors:    Risk Reduction Factors:     Total Time spent with patient: 1 hour Principal Problem: MDD (major depressive disorder), severe (HCC) Diagnosis:  Principal Problem:   MDD (major depressive disorder), severe (HCC) Active Problems:   Cocaine abuse (HCC)  Subjective Data: Patient seen chart reviewed.  Patient came into the hospital after overdosing on antidepressant medicine.  Voiced serious suicidal ideation.  Continues to voice multiple symptoms of major depression including suicidal ideation and hopelessness.  No psychotic symptoms.  Agrees not to harm himself in the hospital.  No homicidal ideation.  Continued Clinical Symptoms:    The "Alcohol Use Disorders Identification Test", Guidelines for Use in Primary Care, Second Edition.  World Science writer [facility]). Score between 0-7:  no or low risk or alcohol related problems. Score between 8-15:  moderate risk of alcohol related problems. Score between 16-19:  high risk of alcohol related problems. Score 20 or above:  warrants further diagnostic evaluation for alcohol dependence and treatment.   CLINICAL FACTORS:   Depression:   Comorbid alcohol abuse/dependence Alcohol/Substance Abuse/Dependencies   Musculoskeletal: Strength & Muscle Tone: within normal limits Gait & Station: normal Patient leans: N/A  Psychiatric Specialty Exam: Physical Exam  Nursing note and vitals reviewed. Constitutional: He appears well-developed and well-nourished.  HENT:  Head: Normocephalic and atraumatic.  Eyes: Pupils are equal, round, and reactive to light. Conjunctivae are normal.  Neck: Normal range of motion.  Cardiovascular: Regular rhythm and normal heart sounds.  Respiratory: Effort normal. No respiratory distress.  GI: Soft.  Musculoskeletal: Normal range of motion.   Neurological: He is alert.  Skin: Skin is warm and dry.  Psychiatric: His speech is delayed. He is slowed. Cognition and memory are impaired. He expresses impulsivity. He exhibits a depressed mood. He expresses suicidal ideation. He expresses suicidal plans.    Review of Systems  Constitutional: Negative.   HENT: Negative.   Eyes: Negative.   Respiratory: Negative.   Cardiovascular: Negative.   Gastrointestinal: Negative.   Musculoskeletal: Negative.   Skin: Negative.   Neurological: Negative.   Psychiatric/Behavioral: Positive for depression, memory loss, substance abuse and suicidal ideas. Negative for hallucinations. The patient is nervous/anxious and has insomnia.     There were no vitals taken for this visit.There is no height or weight on file to calculate BMI.  General Appearance: Disheveled  Eye Contact:  Minimal  Speech:  Slow  Volume:  Decreased  Mood:  Depressed  Affect:  Congruent  Thought Process:  Coherent  Orientation:  Full (Time, Place, and Person)  Thought Content:  Rumination  Suicidal Thoughts:  Yes.  with intent/plan  Homicidal Thoughts:  No  Memory:  Immediate;   Fair Recent;   Fair Remote;   Fair  Judgement:  Impaired  Insight:  Shallow  Psychomotor Activity:  Decreased  Concentration:  Concentration: Poor  Recall:  [employer] of Knowledge:  Fair  Language:  Good  Akathisia:  No  Handed:  Right  AIMS (if indicated):     Assets:  Communication Skills Desire for Improvement Resilience  ADL's:  Impaired  Cognition:  Impaired,  Mild  Sleep:         COGNITIVE FEATURES THAT CONTRIBUTE TO RISK:  Loss of executive function    SUICIDE RISK:   Moderate:  Frequent suicidal ideation  with limited intensity, and duration, some specificity in terms of plans, no associated intent, good self-control, limited dysphoria/symptomatology, some risk factors present, and identifiable protective factors, including available and accessible social support.  PLAN  OF CARE: Patient remains at moderate risk given the serious suicide attempt and ongoing depression compounded with hopelessness and multiple stresses.  Seems to be trustworthy and willing to engage in treatment here.  Continue 15-minute checks.  Engage in individual and group therapy.  See full notes for medication management.  Ongoing assessment of suicidality before determining discharge plan  I certify that inpatient services furnished can reasonably be expected to improve the patient's condition.   Mordecai RasmussenJohn Sylvia Kondracki, MD 07/14/2018, 2:15 PM

## 2018-07-14 NOTE — ED Notes (Signed)
High falls risk precautions in place Pt also stating that he brought himself to the ED last pm and he would like to leave now - advised pt that he was IVC'd and explained the process

## 2018-07-14 NOTE — ED Notes (Signed)
Report given to Raquel RN in BMU

## 2018-07-14 NOTE — BH Assessment (Signed)
Patient is to be admitted to [facility] by Nurse Practitioner  Catha Nottingham.  Attending Physician will be Dr. Toni Amend.   Patient has been assigned to room 315, by [facility] Charge Nurse Dementria.   ER staff is aware of the admission:  Bonita Quin, ER Secretary    Dr. Marisa Severin, ER MD   Kathlen Mody, Patient's Nurse   Nicole Cella, Patient Access.  Per EDP, Patient can transfer to the unit when he complete his IV fluids.

## 2018-07-14 NOTE — ED Notes (Signed)
Nurse called poison control for pt. Recommendation is to optimize magnesium and potassium to prevent torsades. Observe 4-6 hours from ingestion or until sx have resolved.

## 2018-07-14 NOTE — Progress Notes (Signed)
Inpatient Diabetes Program Recommendations  AACE/ADA: New Consensus Statement on Inpatient Glycemic Control (2015)  Target Ranges:  Prepandial:   less than 140 mg/dL      Peak postprandial:   less than 180 mg/dL (1-2 hours)      Critically ill patients:  140 - 180 mg/dL   Lab Results  Component Value Date   HGBA1C 5.2 06/19/2015    Review of Glycemic Control Results for Daniel [name], Daniel [name] (MRN [id number]) as of 07/14/2018 13:21  Ref. Range 07/14/2018 03:28  Glucose Latest Ref Range: 70 - 99 mg/dL 233 (H)   Diabetes history: No hx. Of DM noted Outpatient Diabetes medications: None Inpatient Diabetes Program Recommendations:    Note elevated glucose on labs.  Consider checking A1C.  Also may consider adding CBG's to treatment tid with meals and HS.  If >150 mg/dL, may need Novolog correction.   Thanks,  Beryl Meager, RN, BC-ADM Inpatient Diabetes Coordinator Pager [phone] (8a-5p)

## 2018-07-14 NOTE — ED Provider Notes (Signed)
[facility] Emergency Department Provider Note   ____________________________________________   First MD Initiated Contact with Patient 07/14/18 0303     (approximate)  I have reviewed the triage vital signs and the nursing notes.   HISTORY  Chief Complaint Drug Overdose    HPI Daniel [name] is a 53 y.o. male who presents to the ED from home status post intentional ingestion and suicide attempt.  Patient reports taking unknown quantity of trazodone 100 mg and hydroxyzine 25 mg prior to arrival.  States he just wants to die.  Denies recent fever, cough, chest pain, shortness of breath, abdominal pain, nausea or vomiting.  Denies recent travel, trauma or exposure to persons diagnosed with coronavirus.       Past Medical History:  Diagnosis Date  . Allergy   . Anxiety   . Back pain   . Chronic pain    2/2 accident/trauma  . Hypertension   . Opioid abuse (HCC)   . Urinary retention     Patient Active Problem List   Diagnosis Date Noted  . MDD (major depressive disorder), recurrent episode, severe (HCC) 07/14/2018  . Marijuana user 02/06/2018  . Inflammatory spondylopathy of lumbar region (HCC) 06/01/2017  . Morbid obesity (HCC) 06/01/2017  . Chronic midline low back pain with right-sided sciatica 06/01/2017  . Encounter for smoking cessation counseling 06/01/2017  . History of substance abuse (HCC) 03/31/2017  . Gastroesophageal reflux disease 03/31/2017  . Major depression 06/20/2015  . Opioid use disorder (HCC) 06/20/2015  . Tobacco use disorder 06/20/2015  . Chronic pain 02/24/2015  . Hypertension 08/02/2014    Past Surgical History:  Procedure Laterality Date  . BACK SURGERY    . KNEE SURGERY Left     Prior to Admission medications   Medication Sig Start Date End Date Taking? Authorizing Provider  amitriptyline (ELAVIL) 50 MG tablet Take 200 mg by mouth at bedtime. Reported on 07/25/2015. On Hold at Pharmacy     [provider]  baclofen (LIORESAL) 10 MG tablet Take 1 tablet (10 mg total) by mouth 3 (three) times daily. 07/27/17   Galen Manila, NP  busPIRone (BUSPAR) 15 MG tablet Take 15 mg by mouth 3 (three) times daily. 09/23/16   [provider]  escitalopram (LEXAPRO) 20 MG tablet Take by mouth.    [provider]  tiZANidine (ZANAFLEX) 4 MG tablet Take 1 tablet (4 mg total) by mouth every 8 (eight) hours as needed for muscle spasms. Patient not taking: Reported on 02/06/2018 08/29/17   Galen Manila, NP  traZODone (DESYREL) 100 MG tablet take 2 tablet by mouth at bedtime 10/08/16   [provider]    Allergies Hydrocodone-acetaminophen and Suboxone [buprenorphine hcl-naloxone hcl]  Family History  Problem Relation Age of Onset  . Alcohol abuse Father     Social History Social History   Tobacco Use  . Smoking status: Current Every Day Smoker    Packs/day: 1.00    Types: E-cigarettes, Cigarettes    Last attempt to quit: 03/16/2015    Years since quitting: 3.3  . Smokeless tobacco: Never Used  Substance Use Topics  . Alcohol use: No  . Drug use: No    Comment: Former use of cocaine and opioids    Review of Systems  Constitutional: No fever/chills Eyes: No visual changes. ENT: No sore throat. Cardiovascular: Denies chest pain. Respiratory: Denies shortness of breath. Gastrointestinal: No abdominal pain.  No nausea, no vomiting.  No diarrhea.  No constipation. Genitourinary: Negative for dysuria. Musculoskeletal: Negative for back pain. Skin: Negative for rash. Neurological: Negative for headaches, focal weakness or numbness. Psychiatric:  Positive for suicide attempt by overdose.  ____________________________________________   PHYSICAL EXAM:  VITAL SIGNS: ED Triage Vitals [07/14/18 0250]  Enc Vitals Group     BP      Pulse      Resp      Temp      Temp src      SpO2      Weight 250 lb (113.4 kg)     Height 6' (1.829 m)      Head Circumference      Peak Flow      Pain Score 0     Pain Loc      Pain Edu?      Excl. in GC?     Constitutional: Alert and oriented.  Disheveled appearing and in no acute distress. Eyes: Conjunctivae are normal. PERRL. EOMI. Head: Atraumatic. Nose: No congestion/rhinnorhea. Mouth/Throat: Mucous membranes are moist.  Oropharynx non-erythematous. Neck: No stridor.   Cardiovascular: Tachycardic rate, regular rhythm. Grossly normal heart sounds.  Good peripheral circulation. Respiratory: Normal respiratory effort.  No retractions. Lungs CTAB. Gastrointestinal: Soft and nontender. No distention. No abdominal bruits. No CVA tenderness. Musculoskeletal: No lower extremity tenderness nor edema.  No joint effusions. Neurologic:  Normal speech and language. No gross focal neurologic deficits are appreciated.  Skin:  Skin is warm, dry and intact. No rash noted. Psychiatric: Mood and affect are flat. Speech and behavior are flat.  ____________________________________________   LABS (all labs ordered are listed, but only abnormal results are displayed)  Labs Reviewed  COMPREHENSIVE METABOLIC PANEL - Abnormal; Notable for the following components:      Result Value   Potassium 3.3 (*)    Glucose, Bld 343 (*)    ALT 73 (*)    All other components within normal limits  ACETAMINOPHEN LEVEL - Abnormal; Notable for the following components:   Acetaminophen (Tylenol), Serum <10 (*)    All other components within normal limits  BLOOD GAS, ARTERIAL - Abnormal; Notable for the following components:   pO2, Arterial 74 (*)    All other components within normal limits  SARS CORONAVIRUS 2 (HOSPITAL ORDER, PERFORMED IN [facility])  URINE CULTURE  CBC WITH DIFFERENTIAL/PLATELET  SALICYLATE LEVEL  TROPONIN I  ETHANOL  MAGNESIUM  MAGNESIUM  URINALYSIS, COMPLETE (UACMP) WITH MICROSCOPIC  ACETAMINOPHEN LEVEL  SALICYLATE LEVEL   ____________________________________________  EKG   ED ECG REPORT I, SUNG,JADE J, the attending physician, personally viewed and interpreted this ECG.   Date: 07/14/2018  EKG Time: 0303  Rate: 116  Rhythm: sinus tachycardia  Axis: Normal  Intervals: QTc 513  ST&T Change: Nonspecific  ____________________________________________  RADIOLOGY  ED MD interpretation: No acute cardiopulmonary process  Official radiology report(s): Dg Chest Port 1 View  Result Date: 07/14/2018 CLINICAL DATA:  Overdose. EXAM: PORTABLE CHEST 1 VIEW COMPARISON:  Radiograph 02/24/2015, CT 08/03/2012 FINDINGS: The cardiomediastinal contours are normal. Mild chronic bronchial thickening. Pulmonary vasculature is normal. No consolidation, pleural effusion, or pneumothorax. No acute osseous abnormalities are seen. Remote left rib fractures. Remote left clavicle fracture. IMPRESSION: No acute chest findings. Electronically Signed   By: Narda Rutherford M.D.   On: 07/14/2018 03:26    ____________________________________________   PROCEDURES  Procedure(s) performed (including Critical Care):  Procedures  CRITICAL CARE Performed by: Irean Hong   Total critical care time: 30 minutes  Critical care  time was exclusive of separately billable procedures and treating other patients.  Critical care was necessary to treat or prevent imminent or life-threatening deterioration.  Critical care was time spent personally by me on the following activities: development of treatment plan with patient and/or surrogate as well as nursing, discussions with consultants, evaluation of patient's response to treatment, examination of patient, obtaining history from patient or surrogate, ordering and performing treatments and interventions, ordering and review of laboratory studies, ordering and review of radiographic studies, pulse oximetry and re-evaluation of patient's condition. ____________________________________________   INITIAL IMPRESSION / ASSESSMENT AND PLAN / ED  COURSE  As part of my medical decision making, I reviewed the following data within the electronic MEDICAL RECORD NUMBER Nursing notes reviewed and incorporated, Labs reviewed, EKG interpreted, Radiograph reviewed and Notes from prior ED visits     Jobe MarkerRodney W Deharo was evaluated in Emergency Department on 07/14/2018 for the symptoms described in the history of present illness. He was evaluated in the context of the global COVID-19 pandemic, which necessitated consideration that the patient might be at risk for infection with the SARS-CoV-2 virus that causes COVID-19. Institutional protocols and algorithms that pertain to the evaluation of patients at risk for COVID-19 are in a state of rapid change based on information released by regulatory bodies including the CDC and federal and state organizations. These policies and algorithms were followed during the patient's care in the ED.   53 year old male who presents with intentional overdose.  Will obtain toxicological lab work and urinalysis, contact poison control.  Initiate IV fluid resuscitation, place patient under involuntary commitment for his safety, consult psychiatry for evaluation.  Psychiatric NP evaluated patient and will admit him to this facility once he is medically cleared.  Plan for minimum 4-hour ED observation and repeat acetaminophen and salicylate levels.  Clinical Course as of Jul 13 700  Fri Jul 14, 2018  [id number] Of note, poison control was contacted by nursing who recommends supportive care and correcting electrolytes.   [JS]  [id number] Patient sleeping in no acute distress.  Repeat EKG QTc slightly improved; not significantly worse.  Patient may be medically cleared after return of his repeat levels.   [JS]    Clinical Course User Index [JS] Irean HongSung, Jade J, MD     ____________________________________________   FINAL CLINICAL IMPRESSION(S) / ED DIAGNOSES  Final diagnoses:  Intentional drug overdose, initial encounter [facility](HCC)  Current  moderate episode of major depressive disorder, unspecified whether recurrent (HCC)  Suicide attempt [facility](HCC)     ED Discharge Orders    None       Note:  This document was prepared using Dragon voice recognition software and may include unintentional dictation errors.   Irean HongSung, Jade J, MD 07/14/18 [phone]

## 2018-07-14 NOTE — ED Notes (Signed)
Attempted to call [facility] with no answer

## 2018-07-14 NOTE — Progress Notes (Signed)
Patient has a blood sugar of 428 this evening and dr. Katrinka Blazing is called for orders.

## 2018-07-14 NOTE — Progress Notes (Signed)
Patient's CBG was 515 before dinner, this Clinical research associate notified MD, and was instructed to administer the highest dose of Insulin that he has ordered per his sliding scale.

## 2018-07-14 NOTE — Progress Notes (Signed)
Patient ID: Daniel [name], male   DOB: 1965/12/03, 53 y.o.   MRN: [id number] Updated note.  Patient's blood sugar was over 300 in the emergency room.  I had thought that that might be an artifactual reading but had started him on blood sugar checks.  To my surprise his check now before supper is over 500.  Appears to have developed diabetes.  He is on sliding scale insulin but I am reaching out to the hospitalist to ask for a consult.  Diet change to carb modified.

## 2018-07-14 NOTE — ED Notes (Signed)
Pt given blanket - security cleared

## 2018-07-14 NOTE — Progress Notes (Signed)
Call back received from dr. Katrinka Blazing ,order  give 5 units of novelog insulin from sliding scale and recheck  In 2 hours and call with results noted and done

## 2018-07-14 NOTE — Consult Note (Addendum)
[facility]to[facility] Consult   Reason for Consult: Drug overdose Referring Physician: Dr. Dolores FrameSung Patient Identification: Daniel [name] MRN:  [id number] Principal Diagnosis: <principal problem not specified> Diagnosis:  Active Problems:   MDD (major depressive disorder), recurrent episode, severe (HCC)   Total Time spent with patient: 1 hour  Subjective: "I just do not want to live anymore."  "I just do not feel like I have a purpose in life." Daniel [name] is a 53 y.o. male patient presented to [facility] ED under involuntary commitment status (IVC).  The patient stated that he took a handful of pills (trazodone 100 mg and hydroxyzine 25 mg) feeling that he have no purpose in life.  He discussed that "I am disabled, I live alone and I just do not feel like I want to live anymore."  The patient states he has been to divorced for over 5 years and still in love with his ex-wife who has remarried and has moved on with her life.  The patient did admit to using cannabis and crack cocaine.  The patient did disclose that he has a cat that he loves dearly.  He voiced that he does not have a great family support.    The patient was seen face-to-face by this provider; chart reviewed and consulted with Dr. Dolores FrameSung on 07/14/2018 due to the care of the patient. It was discussed with the provider that the patient does meet criteria to be admitted to the inpatient unit once medically clear and when a bed becomes available. On evaluation the patient is alert and oriented x3, calm and cooperative with depressed mood.  During his assessment he became emotional discussing how he truly feel that he has no purpose in life.  The patient states, that he has been diagnosed with major depressive disorder and bipolar. He disclosed that he sees Dr. Dan HumphreysWalker, but he is unable to provide if Dr.Walker is his PCP or psychiatrist and location.  He states he became overwhelmed feeling of sadness and "wanting to die."  He stated he took a  handful of pills and then called his brother [name] The patient does not appear to be responding to internal or external stimuli. Neither is the patient presenting with any delusional thinking. The patient did admit to experiencing auditory or visual hallucinations. The patient does admit suicidal, and self-harm ideations but denies homicidal ideation. The patient is not presenting with any psychotic or paranoid behaviors. During an encounter with the patient, he was able to answer some questions appropriately. Collateral was not obtained at this time  Plan: The patient is a safety risk to self and currently does require psychiatric inpatient admission for stabilization and treatment once he is medically cleared.  HPI: Per Dr. Dolores FrameSung; Daniel [name] is a 53 y.o. male who presents to the ED from home status post intentional ingestion and suicide attempt.  Patient reports taking unknown quantity of trazodone 100 mg and hydroxyzine 25 mg prior to arrival.  States he just wants to die.  Denies recent fever, cough, chest pain, shortness of breath, abdominal pain, nausea or vomiting.  Denies recent travel, trauma or exposure to persons diagnosed with coronavirus.  Past Psychiatric History:  Major depression Opioids abuse (HCC) Bipolar 1 (HCC)  Risk to Self:  Yes Risk to Others:  No Prior Inpatient Therapy:  Yes Prior Outpatient Therapy:  Yes  Past Medical History:  Past Medical History:  Diagnosis Date  . Allergy   . Anxiety   . Back pain   .  Chronic pain    2/2 accident/trauma  . Hypertension   . Opioid abuse (HCC)   . Urinary retention     Past Surgical History:  Procedure Laterality Date  . BACK SURGERY    . KNEE SURGERY Left    Family History:  Family History  Problem Relation Age of Onset  . Alcohol abuse Father    Family Psychiatric  History: None Social History:  Social History   Substance and Sexual Activity  Alcohol Use No     Social History   Substance and Sexual  Activity  Drug Use No   Comment: Former use of cocaine and opioids    Social History   Socioeconomic History  . Marital status: Divorced    Spouse name: Not on file  . Number of children: Not on file  . Years of education: Not on file  . Highest education level: Not on file  Occupational History  . Not on file  Social Needs  . Financial resource strain: Not on file  . Food insecurity:    Worry: Not on file    Inability: Not on file  . Transportation needs:    Medical: Not on file    Non-medical: Not on file  Tobacco Use  . Smoking status: Current Every Day Smoker    Packs/day: 1.00    Types: E-cigarettes, Cigarettes    Last attempt to quit: 03/16/2015    Years since quitting: 3.3  . Smokeless tobacco: Never Used  Substance and Sexual Activity  . Alcohol use: No  . Drug use: No    Comment: Former use of cocaine and opioids  . Sexual activity: Not on file  Lifestyle  . Physical activity:    Days per week: Not on file    Minutes per session: Not on file  . Stress: Not on file  Relationships  . Social connections:    Talks on phone: Not on file    Gets together: Not on file    Attends religious service: Not on file    Active member of club or organization: Not on file    Attends meetings of clubs or organizations: Not on file    Relationship status: Not on file  Other Topics Concern  . Not on file  Social History Narrative  . Not on file   Additional Social History:    Allergies:   Allergies  Allergen Reactions  . Hydrocodone-Acetaminophen Hives    Vicodin/Norco  . Suboxone [Buprenorphine Hcl-Naloxone Hcl] Other (See Comments)    Urinary retention    Labs: No results found for this or any previous visit (from the past 48 hour(s)).  Current Facility-Administered Medications  Medication Dose Route Frequency Provider Last Rate Last Dose  . sodium chloride 0.9 % bolus 1,000 mL  1,000 mL Intravenous Once Irean Hong, MD       Current Outpatient Medications   Medication Sig Dispense Refill  . amitriptyline (ELAVIL) 50 MG tablet Take 200 mg by mouth at bedtime. Reported on 07/25/2015. On Hold at Pharmacy     . baclofen (LIORESAL) 10 MG tablet Take 1 tablet (10 mg total) by mouth 3 (three) times daily. 90 each 1  . busPIRone (BUSPAR) 15 MG tablet Take 15 mg by mouth 3 (three) times daily.  0  . escitalopram (LEXAPRO) 20 MG tablet Take by mouth.    Marland Kitchen tiZANidine (ZANAFLEX) 4 MG tablet Take 1 tablet (4 mg total) by mouth every 8 (eight) hours as needed for muscle  spasms. (Patient not taking: Reported on 02/06/2018) 90 tablet 2  . traZODone (DESYREL) 100 MG tablet take 2 tablet by mouth at bedtime  0    Musculoskeletal: Strength & Muscle Tone: within normal limits Gait & Station: unsteady Patient leans: N/A  Psychiatric Specialty Exam: Physical Exam  Nursing note and vitals reviewed. Constitutional: He is oriented to person, place, and time. He appears well-developed and well-nourished.  HENT:  Head: Normocephalic and atraumatic.  Eyes: Pupils are equal, round, and reactive to light. Conjunctivae and EOM are normal.  Neck: Normal range of motion. Neck supple.  Cardiovascular: Normal rate and regular rhythm.  Respiratory: Effort normal and breath sounds normal.  Musculoskeletal: Normal range of motion.  Neurological: He is alert and oriented to person, place, and time. He has normal reflexes.  Skin: Skin is warm and dry.    Review of Systems  Psychiatric/Behavioral: Positive for depression, substance abuse and suicidal ideas. The patient is nervous/anxious and has insomnia.   All other systems reviewed and are negative.   Blood pressure (!) 128/93, pulse (!) 116, temperature 98.4 F (36.9 C), temperature source Oral, resp. rate 18, height 6' (1.829 m), weight 113.4 kg, SpO2 98 %.Body mass index is 33.91 kg/m.  General Appearance: Disheveled  Eye Contact:  Poor  Speech:  Slow and Slurred  Volume:  Decreased  Mood:  Anxious, Depressed,  Hopeless and Worthless  Affect:  Non-Congruent, Constricted, Depressed, Flat, Restricted and Tearful  Thought Process:  Linear  Orientation:  Full (Time, Place, and Person)  Thought Content:  Illogical  Suicidal Thoughts:  Yes.  with intent/plan  Homicidal Thoughts:  No  Memory:  Immediate;   Fair Recent;   Fair  Judgement:  Poor  Insight:  Lacking  Psychomotor Activity:  Decreased  Concentration:  Concentration: Poor and Attention Span: Poor  Recall:  Poor  Fund of Knowledge:  Poor  Language:  Fair  Akathisia:  NA  Handed:  Right  AIMS (if indicated):     Assets:  Desire for Improvement Financial Resources/Insurance Physical Health Social Support  ADL's:  Intact  Cognition:  Impaired,  Mild  Sleep:   Insomnia     Treatment Plan Summary: Daily contact with patient to assess and evaluate symptoms and progress in treatment, Medication management and Plan The patient does meet criteria for psychiatric inpatient admission once medically clear  Disposition: Recommend psychiatric Inpatient admission when medically cleared. Supportive therapy provided about ongoing stressors.  Catalina Gravel, NP 07/14/2018 3:37 AM

## 2018-07-14 NOTE — ED Notes (Signed)
Pt refused to use bathroom in room- pt went to quad bathroom- when returned to room it was noted that bathroom smelled like smoke- when asked about it pt gave cigarettes and lighter from sock to officer- 10 pall mall cigarettes and red lighter placed with belongings

## 2018-07-14 NOTE — Tx Team (Signed)
Initial Treatment Plan 07/14/2018 12:08 PM Daniel [name] JJO:[id number]    PATIENT STRESSORS: Legal issue Marital or family conflict Substance abuse   PATIENT STRENGTHS: Ability for insight Average or above average intelligence Communication skills   PATIENT IDENTIFIED PROBLEMS: Drug Overdose  Depression                   DISCHARGE CRITERIA:  Ability to meet basic life and health needs Adequate post-discharge living arrangements Medical problems require only outpatient monitoring Verbal commitment to aftercare and medication compliance  PRELIMINARY DISCHARGE PLAN: Attend aftercare/continuing care group Return to previous living arrangement  PATIENT/FAMILY INVOLVEMENT: This treatment plan has been presented to and reviewed with the patient, Daniel [name], and/or family member,  The patient and family have been given the opportunity to ask questions and make suggestions.  Leonarda Salon, RN 07/14/2018, 12:08 PM

## 2018-07-14 NOTE — ED Notes (Signed)
Pt given a coke to drink.

## 2018-07-14 NOTE — Plan of Care (Signed)
New admission.  Problem: Education: Goal: Knowledge of [place] [facility] Education information/materials will improve Outcome: Not Progressing   Problem: Health Behavior/Discharge Planning: Goal: Identification of resources available to assist in meeting health care needs will improve Outcome: Not Progressing Goal: Compliance with treatment plan for underlying cause of condition will improve Outcome: Not Progressing   Problem: Coping: Goal: Coping ability will improve Outcome: Not Progressing Goal: Will verbalize feelings Outcome: Not Progressing   Problem: Role Relationship: Goal: Will demonstrate positive changes in social behaviors and relationships Outcome: Not Progressing

## 2018-07-14 NOTE — H&P (Signed)
Psychiatric Admission Assessment Adult  Patient Identification: SOL ENGLERT MRN:  [id number] Date of Evaluation:  07/14/2018 Chief Complaint:  Depression Principal Diagnosis: MDD (major depressive disorder), severe (HCC) Diagnosis:  Principal Problem:   MDD (major depressive disorder), severe (HCC) Active Problems:   Cocaine abuse (HCC)  History of Present Illness: Patient seen chart reviewed.  Patient known from previous encounters to some degree.  This is a middle-aged gentleman with a history of depression and substance abuse who came to the hospital after overdosing on prescription medicine.  He says that he overdosed on his own Lexapro and amitriptyline.  He was brought to the hospital when a neighbor saw him tripping and falling down a flight of stairs.  Patient was stabilized in the emergency room and is transferred to [place] from medical.  Patient says his mood is been extremely depressed for the last couple months.  Sleeping poorly.  Feels anxious all the time.  Constantly pacing around the house.  He admits that a major stresses that he is relapsed into cocaine use.  He denies that he is using opiates.  Tries to minimize the cocaine use.  He says his brother has shown up and has been staying with him and his brother supposedly leads him into the temptation of drug abuse.  Patient is not currently following up with any mental health provider.  He apparently is still on Lexapro and amitriptyline that somebody had prescribed for him.  Denies any hallucinations.  He has not been eating well and has been dehydrated as well.  He denies any psychotic symptoms Associated Signs/Symptoms: Depression Symptoms:  depressed mood, anhedonia, insomnia, feelings of worthlessness/guilt, difficulty concentrating, hopelessness, suicidal attempt, anxiety, (Hypo) Manic Symptoms:  Distractibility, Anxiety Symptoms:  Excessive Worry, Psychotic Symptoms:  Denies any PTSD Symptoms: Negative Total Time spent  with patient: 1 hour  Past Psychiatric History: Patient has a history of recurrent depression.  Frequently in the context of substance abuse.  History of opiate abuse.  I found evidence that he was taking Suboxone as recently as last month but apparently has not gotten any filled since early April.  Not sure why he is not following up with that.  He used to go to [employer] but it looks like he has not been involved with them in at least a year.  He has been on multiple antidepressants in the past as well as antipsychotics to help with depression.  He cannot remember if any of them have ever really been of much help.  He does have a past history of suicide attempts.  Is the patient at risk to self? Yes.    Has the patient been a risk to self in the past 6 months? Yes.    Has the patient been a risk to self within the distant past? Yes.    Is the patient a risk to others? No.  Has the patient been a risk to others in the past 6 months? No.  Has the patient been a risk to others within the distant past? No.   Prior Inpatient Therapy:   Prior Outpatient Therapy:    Alcohol Screening:   Substance Abuse History in the last 12 months:  Yes.   Consequences of Substance Abuse: Medical Consequences:  Worsening depression resulting in this overdose Previous Psychotropic Medications: Yes  Psychological Evaluations: Yes  Past Medical History:  Past Medical History:  Diagnosis Date  . Allergy   . Anxiety   . Back pain   . Chronic  pain    2/2 accident/trauma  . Hypertension   . Opioid abuse (HCC)   . Urinary retention     Past Surgical History:  Procedure Laterality Date  . BACK SURGERY    . KNEE SURGERY Left    Family History:  Family History  Problem Relation Age of Onset  . Alcohol abuse Father    Family Psychiatric  History: Positive for substance abuse and other members of the family Tobacco Screening:   Social History:  Social History   Substance and Sexual Activity  Alcohol Use No      Social History   Substance and Sexual Activity  Drug Use No   Comment: Former use of cocaine and opioids    Additional Social History:                           Allergies:   Allergies  Allergen Reactions  . Hydrocodone-Acetaminophen Hives    Vicodin/Norco  . Suboxone [Buprenorphine Hcl-Naloxone Hcl] Other (See Comments)    Urinary retention   Lab Results:  Results for orders placed or performed during the hospital encounter of 07/14/18 (from the past 48 hour(s))  CBC with Differential     Status: None   Collection Time: 07/14/18  3:28 AM  Result Value Ref Range   WBC 8.5 4.0 - 10.5 K/uL   RBC 4.89 4.22 - 5.81 MIL/uL   Hemoglobin 15.0 13.0 - 17.0 g/dL   HCT 27.0 62.3 - 76.2 %   MCV 87.3 80.0 - 100.0 fL   MCH 30.7 26.0 - 34.0 pg   MCHC 35.1 30.0 - 36.0 g/dL   RDW 83.1 51.7 - 61.6 %   Platelets 224 150 - 400 K/uL   nRBC 0.0 0.0 - 0.2 %   Neutrophils Relative % 71 %   Neutro Abs 6.0 1.7 - 7.7 K/uL   Lymphocytes Relative 24 %   Lymphs Abs 2.1 0.7 - 4.0 K/uL   Monocytes Relative 5 %   Monocytes Absolute 0.4 0.1 - 1.0 K/uL   Eosinophils Relative 0 %   Eosinophils Absolute 0.0 0.0 - 0.5 K/uL   Basophils Relative 0 %   Basophils Absolute 0.0 0.0 - 0.1 K/uL   Immature Granulocytes 0 %   Abs Immature Granulocytes 0.01 0.00 - 0.07 K/uL    Comment: Performed at [facility], [address] [place], [place] [address]  Comprehensive metabolic panel     Status: Abnormal   Collection Time: 07/14/18  3:28 AM  Result Value Ref Range   Sodium 139 135 - 145 mmol/L   Potassium 3.3 (L) 3.5 - 5.1 mmol/L   Chloride 105 98 - 111 mmol/L   CO2 23 22 - 32 mmol/L   Glucose, Bld 343 (H) 70 - 99 mg/dL   BUN 6 6 - 20 mg/dL   Creatinine, Ser 0.62 0.61 - 1.24 mg/dL   Calcium 9.0 8.9 - 69.4 mg/dL   Total Protein 7.7 6.5 - 8.1 g/dL   Albumin 4.1 3.5 - 5.0 g/dL   AST 39 15 - 41 U/L   ALT 73 (H) 0 - 44 U/L   Alkaline Phosphatase 98 38 - 126 U/L   Total Bilirubin 0.5  0.3 - 1.2 mg/dL   GFR calc non Af Amer >60 >60 mL/min   GFR calc Af Amer >60 >60 mL/min   Anion gap 11 5 - 15    Comment: Performed at [facility], [address] [place], [place] [address]  Acetaminophen level     Status: Abnormal   Collection Time: 07/14/18  3:28 AM  Result Value Ref Range   Acetaminophen (Tylenol), Serum <10 (L) 10 - 30 ug/mL    Comment: (NOTE) Therapeutic concentrations vary significantly. A range of 10-30 ug/mL  may be an effective concentration for many patients. However, some  are best treated at concentrations outside of this range. Acetaminophen concentrations >150 ug/mL at 4 hours after ingestion  and >50 ug/mL at 12 hours after ingestion are often associated with  toxic reactions. Performed at [facility], [address] [place], [place] [address]   Salicylate level     Status: None   Collection Time: 07/14/18  3:28 AM  Result Value Ref Range   Salicylate Lvl <7.0 2.8 - 30.0 mg/dL    Comment: Performed at [facility], [address] [place], [place] [address]  Troponin I - Once     Status: None   Collection Time: 07/14/18  3:28 AM  Result Value Ref Range   Troponin I <0.03 <0.03 ng/mL    Comment: Performed at [facility], [address] [place], [place] [address]  Ethanol     Status: None   Collection Time: 07/14/18  3:28 AM  Result Value Ref Range   Alcohol, Ethyl (B) <10 <10 mg/dL    Comment: (NOTE) Lowest detectable limit for serum alcohol is 10 mg/dL. For medical purposes only. Performed at [facility], [address] [place], [place] [address]   Blood gas, arterial     Status: Abnormal   Collection Time: 07/14/18  3:28 AM  Result Value Ref Range   FIO2 0.21    Delivery systems ROOM AIR    pH, Arterial 7.45 7.350 - 7.450   pCO2 arterial 34 32.0 - 48.0 mmHg   pO2, Arterial 74 (L) 83.0 - 108.0 mmHg   Bicarbonate 23.6 20.0 - 28.0 mmol/L   Acid-Base Excess 0.1 0.0 - 2.0 mmol/L   O2  Saturation 95.4 %   Patient temperature 37.0    Collection site RIGHT RADIAL    Sample type ARTERIAL DRAW    Allens test (pass/fail) PASS PASS    Comment: Performed at [facility], [address], [place], [place] [address]  SARS Coronavirus 2 (CEPHEID - Performed in [facility] hospital lab), Hosp Order     Status: None   Collection Time: 07/14/18  3:28 AM  Result Value Ref Range   SARS Coronavirus 2 NEGATIVE NEGATIVE    Comment: (NOTE) If result is NEGATIVE SARS-CoV-2 target nucleic acids are NOT DETECTED. The SARS-CoV-2 RNA is generally detectable in upper and lower  respiratory specimens during the acute phase of infection. The lowest  concentration of SARS-CoV-2 viral copies this assay can detect is 250  copies / mL. A negative result does not preclude SARS-CoV-2 infection  and should not be used as the sole basis for treatment or other  patient management decisions.  A negative result may occur with  improper specimen collection / handling, submission of specimen other  than nasopharyngeal swab, presence of viral mutation(s) within the  areas targeted by this assay, and inadequate number of viral copies  (<250 copies / mL). A negative result must be combined with clinical  observations, patient history, and epidemiological information. If result is POSITIVE SARS-CoV-2 target nucleic acids are DETECTED. The SARS-CoV-2 RNA is generally detectable in upper and lower  respiratory specimens dur ing the acute phase of  infection.  Positive  results are indicative of active infection with SARS-CoV-2.  Clinical  correlation with patient history and other diagnostic information is  necessary to determine patient infection status.  Positive results do  not rule out bacterial infection or co-infection with other viruses. If result is PRESUMPTIVE POSTIVE SARS-CoV-2 nucleic acids MAY BE PRESENT.   A presumptive positive result was obtained on the submitted specimen  and  confirmed on repeat testing.  While 2019 novel coronavirus  (SARS-CoV-2) nucleic acids may be present in the submitted sample  additional confirmatory testing may be necessary for epidemiological  and / or clinical management purposes  to differentiate between  SARS-CoV-2 and other Sarbecovirus currently known to infect humans.  If clinically indicated additional testing with an alternate test  methodology [phone]) is advised. The SARS-CoV-2 RNA is generally  detectable in upper and lower respiratory sp ecimens during the acute  phase of infection. The expected result is Negative. Fact Sheet for Patients:  BoilerBrush.com.cy Fact Sheet for Healthcare Providers: https://pope.com/ This test is not yet approved or cleared by the [place] FDA and has been authorized for detection and/or diagnosis of SARS-CoV-2 by FDA under an Emergency Use Authorization (EUA).  This EUA will remain in effect (meaning this test can be used) for the duration of the COVID-19 declaration under Section 564(b)(1) of the Act, 21 U.S.C. section 360bbb-3(b)(1), unless the authorization is terminated or revoked sooner. Performed at [facility], [address] [place], [place] [address]   Magnesium     Status: None   Collection Time: 07/14/18  3:28 AM  Result Value Ref Range   Magnesium 1.9 1.7 - 2.4 mg/dL    Comment: Performed at [facility], [address] [place], [place] [address]  Acetaminophen level     Status: Abnormal   Collection Time: 07/14/18  6:16 AM  Result Value Ref Range   Acetaminophen (Tylenol), Serum <10 (L) 10 - 30 ug/mL    Comment: (NOTE) Therapeutic concentrations vary significantly. A range of 10-30 ug/mL  may be an effective concentration for many patients. However, some  are best treated at concentrations outside of this range. Acetaminophen concentrations >150 ug/mL at 4 hours after ingestion  and >50 ug/mL at  12 hours after ingestion are often associated with  toxic reactions. Performed at [facility], [address] [place], [place] [address]   Salicylate level     Status: None   Collection Time: 07/14/18  6:16 AM  Result Value Ref Range   Salicylate Lvl <7.0 2.8 - 30.0 mg/dL    Comment: Performed at [facility], [address] [place], [place] [address]  Magnesium     Status: None   Collection Time: 07/14/18  6:16 AM  Result Value Ref Range   Magnesium 2.0 1.7 - 2.4 mg/dL    Comment: Performed at [facility], [address] [place], [place] [address]    Blood Alcohol level:  Lab Results  Component Value Date   [facility] <10 07/14/2018   ETH <5 04/15/2016    Metabolic Disorder Labs:  Lab Results  Component Value Date   HGBA1C 5.2 06/19/2015   No results found for: PROLACTIN Lab Results  Component Value Date   CHOL 140 02/24/2015   TRIG 89 02/24/2015   HDL 32 (L) 02/24/2015   CHOLHDL 4.4 02/24/2015   VLDL 18 02/24/2015   LDLCALC 90 02/24/2015   LDLCALC 44 11/29/2011    Current Medications: Current Facility-Administered Medications  Medication Dose Route Frequency Provider Last Rate Last Dose  . alum & mag hydroxide-simeth (MAALOX/MYLANTA) 200-200-20 MG/5ML suspension 30 mL  30 mL Oral Q4H PRN Catalina Gravel, NP      . diphenhydrAMINE (BENADRYL) capsule 25 mg  25 mg Oral Q6H PRN Thomspon, Adela Lank, NP      . insulin aspart (novoLOG) injection 0-9 Units  0-9 Units Subcutaneous TID WC ,  T, MD      . magnesium hydroxide (MILK OF MAGNESIA) suspension 30 mL  30 mL Oral Daily PRN Thomspon, Adela Lank, NP      . temazepam (RESTORIL) capsule 15 mg  15 mg Oral QHS PRN , Jackquline Denmark, MD       PTA Medications: Medications Prior to Admission  Medication Sig Dispense Refill Last Dose  . amitriptyline (ELAVIL) 50 MG tablet Take 200 mg by mouth at bedtime. Reported on 07/25/2015. On Hold at Pharmacy    Taking  . baclofen (LIORESAL)  10 MG tablet Take 1 tablet (10 mg total) by mouth 3 (three) times daily. 90 each 1 Taking  . busPIRone (BUSPAR) 15 MG tablet Take 15 mg by mouth 3 (three) times daily.  0 Taking  . escitalopram (LEXAPRO) 20 MG tablet Take by mouth.   Taking  . tiZANidine (ZANAFLEX) 4 MG tablet Take 1 tablet (4 mg total) by mouth every 8 (eight) hours as needed for muscle spasms. (Patient not taking: Reported on 02/06/2018) 90 tablet 2 Not Taking  . traZODone (DESYREL) 100 MG tablet take 2 tablet by mouth at bedtime  0 Taking    Musculoskeletal: Strength & Muscle Tone: within normal limits Gait & Station: normal Patient leans: N/A  Psychiatric Specialty Exam: Physical Exam  Nursing note and vitals reviewed. Constitutional: He appears well-developed and well-nourished.  HENT:  Head: Normocephalic and atraumatic.  Eyes: Pupils are equal, round, and reactive to light. Conjunctivae are normal.  Neck: Normal range of motion.  Cardiovascular: Regular rhythm and normal heart sounds.  Respiratory: Effort normal. No respiratory distress.  GI: Soft.  Musculoskeletal: Normal range of motion.  Neurological: He is alert.  Skin: Skin is warm and dry.  Psychiatric: His affect is blunt. His speech is delayed. He is slowed. Cognition and memory are impaired. He expresses impulsivity. He exhibits a depressed mood. He expresses suicidal ideation. He expresses suicidal plans.    Review of Systems  Constitutional: Negative.   HENT: Negative.   Eyes: Negative.   Respiratory: Negative.   Cardiovascular: Negative.   Gastrointestinal: Negative.   Musculoskeletal: Negative.   Skin: Negative.   Neurological: Negative.   Psychiatric/Behavioral: Positive for depression, substance abuse and suicidal ideas. Negative for hallucinations. The patient is nervous/anxious and has insomnia.     There were no vitals taken for this visit.There is no height or weight on file to calculate BMI.  General Appearance: Disheveled  Eye  Contact:  Minimal  Speech:  Slow  Volume:  Decreased  Mood:  Depressed  Affect:  Congruent  Thought Process:  Coherent  Orientation:  Full (Time, Place, and Person)  Thought Content:  Logical  Suicidal Thoughts:  Yes.  with intent/plan  Homicidal Thoughts:  No  Memory:  Immediate;   Fair Recent;   Fair Remote;   Fair  Judgement:  Impaired  Insight:  Shallow  Psychomotor Activity:  Decreased  Concentration:  Concentration: Fair  Recall:  [employer] of Knowledge:  Fair  Language:  Fair  Akathisia:  No  Handed:  Right  AIMS (if indicated):  Assets:  Desire for Improvement Resilience  ADL's:  Impaired  Cognition:  Impaired,  Mild  Sleep:       Treatment Plan Summary: Daily contact with patient to assess and evaluate symptoms and progress in treatment, Medication management and Plan Patient will first of all be cooked on 15-minute checks and observed on the unit.  I will recheck his EKG which was abnormal last time it was checked.  Because of his long QT I am not starting any psychiatric medicine at this time.  The only thing we will have for him is as needed Restoril for sleep.  Hopefully we will actually get a urine drug screen at some point.  Once he wakes up we will evaluate him and involve him in groups and individual therapy.  Constant reassessment of suicidality and mood for treatment changes and assessment prior to discharge  Observation Level/Precautions:  15 minute checks  Laboratory:  UDS  Psychotherapy:    Medications:    Consultations:    Discharge Concerns:    Estimated LOS:  Other:     Physician Treatment Plan for Primary Diagnosis: MDD (major depressive disorder), severe (HCC) Long Term Goal(s): Improvement in symptoms so as ready for discharge  Short Term Goals: Ability to verbalize feelings will improve, Ability to disclose and discuss suicidal ideas and Ability to demonstrate self-control will improve  Physician Treatment Plan for Secondary Diagnosis:  Principal Problem:   MDD (major depressive disorder), severe (HCC) Active Problems:   Cocaine abuse (HCC)  Long Term Goal(s): Improvement in symptoms so as ready for discharge  Short Term Goals: Ability to identify changes in lifestyle to reduce recurrence of condition will improve and Ability to identify triggers associated with substance abuse/mental health issues will improve  I certify that inpatient services furnished can reasonably be expected to improve the patient's condition.    Mordecai RasmussenJohn , MD 5/15/20202:19 PM

## 2018-07-14 NOTE — ED Notes (Signed)
Red shirt, 3 keys, black key ring, black watch, blue shoes, white socks, blue boxers, gray sweats.

## 2018-07-14 NOTE — ED Notes (Signed)
Attempted to call [facility] and was told they did not know he was coming and they would need time to review his chart and to call back in 1 hour

## 2018-07-14 NOTE — ED Notes (Signed)
[facility] called to stay pt would go to [facility] and to call back in 5 minutes to give report

## 2018-07-14 NOTE — ED Notes (Signed)
Poison control called and gave recommendations of repeating an EKG and replacing some mag if the QTC was still elevated- spoke with Caryn Bee

## 2018-07-14 NOTE — ED Notes (Signed)
Pt given breakfast tray and juice - hand hygiene performed by pt prior to eating

## 2018-07-15 LAB — BASIC METABOLIC PANEL
Anion gap: 8 (ref 5–15)
BUN: 11 mg/dL (ref 6–20)
CO2: 24 mmol/L (ref 22–32)
Calcium: 9.1 mg/dL (ref 8.9–10.3)
Chloride: 104 mmol/L (ref 98–111)
Creatinine, Ser: 0.76 mg/dL (ref 0.61–1.24)
GFR calc Af Amer: >60 mL/min (ref >60)
GFR calc non Af Amer: >60 mL/min (ref >60)
Glucose, Bld: 314 mg/dL — ABNORMAL HIGH (ref 70–99)
Potassium: 3.4 mmol/L — ABNORMAL LOW (ref 3.5–5.1)
Sodium: 136 mmol/L (ref 135–145)

## 2018-07-15 LAB — GLUCOSE, CAPILLARY
Glucose-Capillary: 260 mg/dL — ABNORMAL HIGH (ref 70–99)
Glucose-Capillary: 274 mg/dL — ABNORMAL HIGH (ref 70–99)
Glucose-Capillary: 288 mg/dL — ABNORMAL HIGH (ref 70–99)
Glucose-Capillary: 303 mg/dL — ABNORMAL HIGH (ref 70–99)
Glucose-Capillary: 324 mg/dL — ABNORMAL HIGH (ref 70–99)

## 2018-07-15 LAB — MAGNESIUM: Magnesium: 2 mg/dL (ref 1.7–2.4)

## 2018-07-15 MED ORDER — INSULIN GLARGINE 100 UNIT/ML ~~LOC~~ SOLN
22.0000 [IU] | Freq: Every day | SUBCUTANEOUS | Status: DC
  Administered 2018-07-15 – 2018-07-16 (×2): 22 [IU] via SUBCUTANEOUS
  Filled 2018-07-15 (×2): qty 0.22

## 2018-07-15 MED ORDER — ACETAMINOPHEN 500 MG PO TABS
1000.0000 mg | ORAL_TABLET | Freq: Two times a day (BID) | ORAL | Status: DC | PRN
  Administered 2018-07-15: 12:00:00 1000 mg via ORAL
  Filled 2018-07-15 (×2): qty 2

## 2018-07-15 MED ORDER — TRAZODONE HCL 100 MG PO TABS
100.0000 mg | ORAL_TABLET | Freq: Every evening | ORAL | Status: DC | PRN
  Administered 2018-07-16 – 2018-07-18 (×4): 100 mg via ORAL
  Filled 2018-07-15 (×4): qty 1

## 2018-07-15 MED ORDER — POTASSIUM CHLORIDE CRYS ER 20 MEQ PO TBCR
40.0000 meq | EXTENDED_RELEASE_TABLET | Freq: Once | ORAL | Status: AC
  Administered 2018-07-15: 15:00:00 40 meq via ORAL
  Filled 2018-07-15: qty 2

## 2018-07-15 MED ORDER — IBUPROFEN 200 MG PO TABS
400.0000 mg | ORAL_TABLET | Freq: Four times a day (QID) | ORAL | Status: DC | PRN
  Administered 2018-07-15 – 2018-07-18 (×3): 400 mg via ORAL
  Filled 2018-07-15 (×3): qty 2

## 2018-07-15 MED ORDER — INSULIN ASPART 100 UNIT/ML ~~LOC~~ SOLN
4.0000 [IU] | Freq: Three times a day (TID) | SUBCUTANEOUS | Status: DC
  Administered 2018-07-15 – 2018-07-16 (×3): 4 [IU] via SUBCUTANEOUS
  Filled 2018-07-15 (×2): qty 1

## 2018-07-15 NOTE — Progress Notes (Signed)
EKG complete and on file noted.

## 2018-07-15 NOTE — Progress Notes (Signed)
Inpatient Diabetes Program Recommendations  AACE/ADA: New Consensus Statement on Inpatient Glycemic Control (2015)  Target Ranges:  Prepandial:   less than 140 mg/dL      Peak postprandial:   less than 180 mg/dL (1-2 hours)      Critically ill patients:  140 - 180 mg/dL   Lab Results  Component Value Date   GLUCAP 274 (H) 07/15/2018   HGBA1C 10.4 (H) 07/14/2018    Review of Glycemic Control  Diabetes history: No prior hx noted Current orders for Inpatient glycemic control: novolog resistant correction tid + hs 0-5  Inpatient Diabetes Program Recommendations:   Noted A1c 10.4 -Lantus 22 units daily (.2 units/kg x 113.4 kg =23 units ) -Novolog 4 units tid meal coverage if eats 50% -Decrease Novolog correction to sensitive tid + hs 0-5 units Will followup with patient regarding education.  Thank you, Daniel [name]. Daniel Novinger, RN, MSN, CDE  Diabetes Coordinator Inpatient Glycemic Control Team Team Pager [phone] (8am-5pm) 07/15/2018 11:44 AM

## 2018-07-15 NOTE — Progress Notes (Addendum)
Patient is placed on carb modified diet and also placed on sliding scale insulin, patient is made aware of his diabetic issues and reason for diet change, patient is alert and coherent and follows command and maintaining routine unit safety guide lines, patient took his medicines with out complain of any side effects, sleep is adequate and did not voice any physical complains and only requiring 15 minutes safety checks no distress.

## 2018-07-15 NOTE — Progress Notes (Signed)
Placed another call to Dr. Katrinka Blazing to give him an update, blood sugar now is 260 threading down from 428, intervention is effective and no further orders received noted.

## 2018-07-15 NOTE — [facility] Suicide Risk Assessment (Signed)
[facility] INPATIENT:  Family/Significant Other Suicide Prevention Education  Suicide Prevention Education:  Patient Refusal for Family/Significant Other Suicide Prevention Education: The patient Daniel [name] has refused to provide written consent for family/significant other to be provided Family/Significant Other Suicide Prevention Education during admission and/or prior to discharge.  Physician notified.  Daniel Harries  [name] 07/15/2018, 3:31 PM

## 2018-07-15 NOTE — Progress Notes (Signed)
Patient has started adjusting to his new dietary requirements regarding his newly diagnosis of diabetes, blood sugar is managed by sliding scale insulin and patient is showing good tolerance , diabetic teaching is provided , patient is calm and responding well to treatment regimen, denies any SI/HI/AVH, patient is socializing and engaging appropriately with peers with out any issues , patient denies any concerns other than he endorsing chronic back pain from previous back surgeries before this current admission, patient sleeps well at night and also is monitored every 15 minutes for safety.

## 2018-07-15 NOTE — Plan of Care (Signed)
Patient staying in bed for headache.Stated that Tylenol helped.Patient rated his depression and anxiety 8/10.Stated that he does not want to talk about his stresses now.Patient is cooperative on approach.Compliant with medication.Medical doctor came to see patient.Denies SI,HI and AVH.Support and encouragement given.
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
## 2018-07-15 NOTE — BHH Group Notes (Signed)
LCSW Group Therapy Note  07/15/2018 1:15pm  Type of Therapy and Topic:  Group Therapy:  Cognitive Distortions  Participation Level:  Did Not Attend   Description of Group:    Patients in this group will be introduced to the topic of cognitive distortions.  Patients will identify and describe cognitive distortions, describe the feelings these distortions create for them.  Patients will identify one or more situations in their personal life where they have cognitively distorted thinking and will verbalize challenging this cognitive distortion through positive thinking skills.  Patients will practice the skill of using positive affirmations to challenge cognitive distortions using affirmation cards.    Therapeutic Goals:  1. Patient will identify two or more cognitive distortions they have used 2. Patient will identify one or more emotions that stem from use of a cognitive distortion 3. Patient will demonstrate use of a positive affirmation to counter a cognitive distortion through discussion and/or role play. 4. Patient will describe one way cognitive distortions can be detrimental to wellness   Summary of Patient Progress: Pt was invited to attend group but chose not to attend. CSW will continue to encourage pt to attend group throughout their admission.      Therapeutic Modalities:   Cognitive Behavioral Therapy Motivational Interviewing   Zaven Klemens  CUEBAS-COLON, LCSW 07/15/2018 10:17 AM

## 2018-07-15 NOTE — Progress Notes (Signed)
[facility] [place] [facility]   PATIENT NAME: Daniel NeptuneRodney [name]    MR#:  [id number]  DATE OF BIRTH:  04/13/1965  SUBJECTIVE:  CHIEF COMPLAINT: Medical service consulted for management of high blood sugars  No new complaints overnight apart from some headache for which patient was given Motrin.  Blood sugar still greater than 300s.  Changes made to insulin regimen as outlined below.  REVIEW OF SYSTEMS:  Review of Systems  Constitutional: Negative for chills and fever.  HENT: Negative for hearing loss and tinnitus.   Eyes: Negative for blurred vision and double vision.  Respiratory: Negative for cough, hemoptysis and shortness of breath.   Cardiovascular: Negative for chest pain and palpitations.  Gastrointestinal: Negative for heartburn, nausea and vomiting.  Genitourinary: Negative for dysuria and urgency.  Musculoskeletal: Negative for myalgias and neck pain.  Skin: Negative for itching and rash.  Neurological: Negative for dizziness and headaches.  Psychiatric/Behavioral: Negative for depression and hallucinations.    DRUG ALLERGIES:   Allergies  Allergen Reactions  . Hydrocodone-Acetaminophen Hives    Vicodin/Norco  . Suboxone [Buprenorphine Hcl-Naloxone Hcl] Other (See Comments)    Urinary retention   VITALS:  Blood pressure 124/77, pulse 100, temperature 98.3 F (36.8 C), temperature source Oral, resp. rate 18, height 6' (1.829 m), weight 113.4 kg, SpO2 99 %. PHYSICAL EXAMINATION:   Physical Exam  Constitutional: He is oriented to person, place, and time. He appears well-developed.  HENT:  Head: Normocephalic and atraumatic.  Right Ear: External ear normal.  Eyes: Pupils are equal, round, and reactive to light. Conjunctivae are normal. Right eye exhibits no discharge.  Neck: Normal range of motion. Neck supple. No thyromegaly present.  Cardiovascular: Normal rate, regular rhythm and normal heart sounds.  Respiratory: Effort normal. No  respiratory distress.  GI: Soft. Bowel sounds are normal. There is no abdominal tenderness.  Musculoskeletal: Normal range of motion.        General: No edema.  Neurological: He is alert and oriented to person, place, and time. No cranial nerve deficit.  Skin: Skin is warm. He is not diaphoretic. No erythema.  Psychiatric: He has a normal mood and affect. His behavior is normal.   LABORATORY PANEL:  Male CBC Recent Labs  Lab 07/14/18 0328  WBC 8.5  HGB 15.0  HCT 42.7  PLT 224   ------------------------------------------------------------------------------------------------------------------ Chemistries  Recent Labs  Lab 07/14/18 0328  07/15/18 0952  NA 139  --  136  K 3.3*  --  3.4*  CL 105  --  104  CO2 23  --  24  GLUCOSE 343*  --  314*  BUN 6  --  11  CREATININE 0.70  --  0.76  CALCIUM 9.0  --  9.1  MG 1.9   < > 2.0  AST 39  --   --   ALT 73*  --   --   ALKPHOS 98  --   --   BILITOT 0.5  --   --    < > = values in this interval not displayed.   RADIOLOGY:  No results found. ASSESSMENT AND PLAN:   1.  Newly diagnosed diabetes mellitus with acute hyperglycemia With history of polyuria, intermittent blurry vision, nocturia.  Glycosylated hemoglobin level of 10.4. Blood sugars improved but still running above 300. Appreciate input from diabetic nurse/coordinator. Patient started on Lantus insulin 22 units daily.  Placed on pre-meal NovoLog insulin 4 units 3 times daily with meals.  Continue sliding  scale insulin coverage. Monitor blood sugars and adjust regimen as needed.  2. Hypokalemia; replace.  Follow-up on potassium level in a.m. Magnesium level normal  3. History of hypertension Blood pressure controlled  4. Acute dual diagnosis with depression and substance abuse Management per primary service  5. History of urinary retention Stable Conservative medical management  DVT prophylaxis; patient is ambulatory on the psych floor.    All the  records are reviewed and case discussed with Care Management/Social Worker. Management plans discussed with the patient, family and they are in agreement.  CODE STATUS: Full Code  TOTAL TIME TAKING CARE OF THIS PATIENT: 35 minutes.   More than 50% of the time was spent in counseling/coordination of care: YES  POSSIBLE D/C IN 3 DAYS, DEPENDING ON CLINICAL CONDITION.   Monish Haliburton M.D on 07/15/2018 at 12:31 PM  Between 7am to 6pm - Pager - [phone]  After 6pm go to [email or web address] - Social research officer, government  [facility]  Office  [phone]  CC: Primary care physician; System, Pcp Not In  Note: This dictation was prepared with Dragon dictation along with smaller phrase technology. Any transcriptional errors that result from this process are unintentional.
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
## 2018-07-16 LAB — GLUCOSE, CAPILLARY
Glucose-Capillary: 255 mg/dL — ABNORMAL HIGH (ref 70–99)
Glucose-Capillary: 312 mg/dL — ABNORMAL HIGH (ref 70–99)
Glucose-Capillary: 286 mg/dL — ABNORMAL HIGH (ref 70–99)
Glucose-Capillary: 256 mg/dL — ABNORMAL HIGH (ref 70–99)

## 2018-07-16 LAB — CBC
HCT: 43.2 % (ref 39.0–52.0)
Hemoglobin: 15.1 g/dL (ref 13.0–17.0)
MCH: 30.8 pg (ref 26.0–34.0)
MCHC: 35 g/dL (ref 30.0–36.0)
MCV: 88 fL (ref 80.0–100.0)
Platelets: 211 10*3/uL (ref 150–400)
RBC: 4.91 MIL/uL (ref 4.22–5.81)
RDW: 12.3 % (ref 11.5–15.5)
WBC: 8.2 10*3/uL (ref 4.0–10.5)
nRBC: 0 % (ref 0.0–0.2)

## 2018-07-16 LAB — BASIC METABOLIC PANEL
Anion gap: 8 (ref 5–15)
BUN: 13 mg/dL (ref 6–20)
CO2: 23 mmol/L (ref 22–32)
Calcium: 9 mg/dL (ref 8.9–10.3)
Chloride: 105 mmol/L (ref 98–111)
Creatinine, Ser: 0.65 mg/dL (ref 0.61–1.24)
GFR calc Af Amer: >60 mL/min (ref >60)
GFR calc non Af Amer: >60 mL/min (ref >60)
Glucose, Bld: 230 mg/dL — ABNORMAL HIGH (ref 70–99)
Potassium: 3.9 mmol/L (ref 3.5–5.1)
Sodium: 136 mmol/L (ref 135–145)

## 2018-07-16 LAB — MAGNESIUM: Magnesium: 1.9 mg/dL (ref 1.7–2.4)

## 2018-07-16 MED ORDER — BUSPIRONE HCL 5 MG PO TABS
15.0000 mg | ORAL_TABLET | Freq: Three times a day (TID) | ORAL | Status: DC
  Administered 2018-07-16 – 2018-07-19 (×9): 15 mg via ORAL
  Filled 2018-07-16 (×10): qty 3

## 2018-07-16 MED ORDER — INSULIN ASPART 100 UNIT/ML ~~LOC~~ SOLN
6.0000 [IU] | Freq: Three times a day (TID) | SUBCUTANEOUS | Status: DC
  Administered 2018-07-16: 6 [IU] via SUBCUTANEOUS
  Filled 2018-07-16: qty 1

## 2018-07-16 MED ORDER — INSULIN GLARGINE 100 UNIT/ML ~~LOC~~ SOLN
26.0000 [IU] | Freq: Every day | SUBCUTANEOUS | Status: DC
  Filled 2018-07-16 (×2): qty 0.26

## 2018-07-16 MED ORDER — ESCITALOPRAM OXALATE 10 MG PO TABS
10.0000 mg | ORAL_TABLET | Freq: Every day | ORAL | Status: DC
  Administered 2018-07-16 – 2018-07-19 (×4): 10 mg via ORAL
  Filled 2018-07-16 (×4): qty 1

## 2018-07-16 NOTE — Progress Notes (Signed)
Patient was called to the medication room at this time for BS check. Patient told nurse, "I just slipped getting out of bed and hit my head on the chair, look feel this knot. " Upon assessment a quarter sized hematoma noted to back left side of his head. "My socks where in slip mode, they were turned to the side." This writer administered IBP for pain and informed Dr. He of incident. ICE pack to head prn ordered. Patient did not loose consciousness after the incident. Speech is clear, gait is steady, pupils equal and reactive to light. Will continue to monitor.

## 2018-07-16 NOTE — Progress Notes (Addendum)
[facility] MD Progress Note  07/16/2018 1:44 PM Daniel [name]  MRN:  [id number] Subjective:  Pt seen and chart reviewed, case discussed with staff.   Daniel [name] is still isolated in his room most of the time, and feeling "tired".  His BG is better controlled, but still on the high side. Of note, his BG was >500 on arrival, and his A1c is 10.4.   Daniel [name] endorses sad mood, but denied SI.   RN: cooperative, no behavioral issues.   Addendum: RN reports that pt slipped and fell because [name] put on the soaks the wrong way.  Daniel [name] has a small pump on the side of his head.  No loss of conspicuousness.  Tylenol prn and ice pack given.   Principal Problem: MDD (major depressive disorder), severe (HCC) Diagnosis: Principal Problem:   MDD (major depressive disorder), severe (HCC) Active Problems:   Cocaine abuse (HCC)  Total Time spent with patient: 20 minutes  Past Psychiatric History: Patient has a history of recurrent depression.  Frequently in the context of substance abuse.  History of opiate abuse.  I found evidence that Daniel [name] was taking Suboxone as recently as last month but apparently has not gotten any filled since early April.  Not sure why Daniel [name] is not following up with that.  Zolton Dowson used to go to [employer] but it looks like Daniel [name] has not been involved with them in at least a year.  Daniel [name] has been on multiple antidepressants in the past as well as antipsychotics to help with depression.  [name] cannot remember if any of them have ever really been of much help.  Daniel [name] does have a past history of suicide attempts.  Past Medical History:  Past Medical History:  Diagnosis Date  . Allergy   . Anxiety   . Back pain   . Chronic pain    2/2 accident/trauma  . Hypertension   . Opioid abuse (HCC)   . Urinary retention     Past Surgical History:  Procedure Laterality Date  . BACK SURGERY    . KNEE SURGERY Left    Family History:  Family History  Problem Relation Age of Onset  . Alcohol abuse Father    Family Psychiatric  History:   Social History:  Social History   Substance and Sexual Activity  Alcohol Use No     Social History   Substance and Sexual Activity  Drug Use No   Comment: Former use of cocaine and opioids    Social History   Socioeconomic History  . Marital status: Divorced    Spouse name: Not on file  . Number of children: Not on file  . Years of education: Not on file  . Highest education level: Not on file  Occupational History  . Not on file  Social Needs  . Financial resource strain: Not on file  . Food insecurity:    Worry: Not on file    Inability: Not on file  . Transportation needs:    Medical: Not on file    Non-medical: Not on file  Tobacco Use  . Smoking status: Current Every Day Smoker    Packs/day: 1.00    Types: E-cigarettes, Cigarettes    Last attempt to quit: 03/16/2015    Years since quitting: 3.3  . Smokeless tobacco: Never Used  Substance and Sexual Activity  . Alcohol use: No  . Drug use: No    Comment: Former use of cocaine and opioids  . Sexual activity: Not on file  Lifestyle  . Physical activity:    Days per week: Not on file    Minutes per session: Not on file  . Stress: Not on file  Relationships  . Social connections:    Talks on phone: Not on file    Gets together: Not on file    Attends religious service: Not on file    Active member of club or organization: Not on file    Attends meetings of clubs or organizations: Not on file    Relationship status: Not on file  Other Topics Concern  . Not on file  Social History Narrative  . Not on file   Additional Social History:   Sleep: Fair  Appetite:  Fair  Current Medications: Current Facility-Administered Medications  Medication Dose Route Frequency Provider Last Rate Last Dose  . acetaminophen (TYLENOL) tablet 1,000 mg  1,000 mg Oral BID PRN Daniel Smedberg, MD   1,000 mg at 07/15/18 1142  . alum & mag hydroxide-simeth (MAALOX/MYLANTA) 200-200-20 MG/5ML suspension 30 mL  30 mL Oral Q4H PRN  Daniel Gravel, NP      . diphenhydrAMINE (BENADRYL) capsule 25 mg  25 mg Oral Q6H PRN Daniel Gravel, NP      . ibuprofen (ADVIL) tablet 400 mg  400 mg Oral Q6H PRN Daniel Clay R, DO   400 mg at 07/15/18 0836  . insulin aspart (novoLOG) injection 0-20 Units  0-20 Units Subcutaneous TID Daniel [name], Montell D, MD   15 Units at 07/16/18 1121  . insulin aspart (novoLOG) injection 0-5 Units  0-5 Units Subcutaneous QHS Daniel Ok D, MD   4 Units at 07/15/18 2130  . insulin aspart (novoLOG) injection 4 Units  4 Units Subcutaneous TID Daniel Daniel, Jude, MD   4 Units at 07/16/18 1121  . insulin glargine (LANTUS) injection 22 Units  22 Units Subcutaneous Daily Daniel [name], Jude, MD   22 Units at 07/16/18 0841  . magnesium hydroxide (MILK OF MAGNESIA) suspension 30 mL  30 mL Oral Daily PRN Daniel Gravel, NP      . nicotine (NICODERM CQ - dosed in mg/24 hours) patch 21 mg  21 mg Transdermal Daily Daniel, Jackquline Denmark, MD   21 mg at 07/16/18 0820  . traZODone (DESYREL) tablet 100 mg  100 mg Oral QHS PRN Daniel Macari, MD        Lab Results:  Results for orders placed or performed during the hospital encounter of 07/14/18 (from the past 48 hour(s))  Glucose, capillary     Status: Abnormal   Collection Time: 07/14/18  4:35 PM  Result Value Ref Range   Glucose-Capillary 515 (HH) 70 - 99 mg/dL   Comment 1 Document in Chart    Comment 2 Call MD NNP PA CNM   Magnesium     Status: None   Collection Time: 07/14/18  5:36 PM  Result Value Ref Range   Magnesium 2.3 1.7 - 2.4 mg/dL    Comment: Performed at [facility], [address] [place], [place] [address]  Glucose, capillary     Status: Abnormal   Collection Time: 07/14/18  5:40 PM  Result Value Ref Range   Glucose-Capillary 470 (H) 70 - 99 mg/dL  Glucose, capillary     Status: Abnormal   Collection Time: 07/14/18  8:54 PM  Result Value Ref Range   Glucose-Capillary 428 (H) 70 - 99 mg/dL  Glucose, capillary     Status: Abnormal    Collection Time: 07/15/18 12:27 AM  Result Value  Ref Range   Glucose-Capillary 260 (H) 70 - 99 mg/dL   Comment 1 Notify RN   Glucose, capillary     Status: Abnormal   Collection Time: 07/15/18  7:02 AM  Result Value Ref Range   Glucose-Capillary 303 (H) 70 - 99 mg/dL   Comment 1 Notify RN   Basic metabolic panel     Status: Abnormal   Collection Time: 07/15/18  9:52 AM  Result Value Ref Range   Sodium 136 135 - 145 mmol/L   Potassium 3.4 (L) 3.5 - 5.1 mmol/L   Chloride 104 98 - 111 mmol/L   CO2 24 22 - 32 mmol/L   Glucose, Bld 314 (H) 70 - 99 mg/dL   BUN 11 6 - 20 mg/dL   Creatinine, Ser 1.61 0.61 - 1.24 mg/dL   Calcium 9.1 8.9 - 09.6 mg/dL   GFR calc non Af Amer >60 >60 mL/min   GFR calc Af Amer >60 >60 mL/min   Anion gap 8 5 - 15    Comment: Performed at [facility], [address] [place], [place] [address]  Magnesium     Status: None   Collection Time: 07/15/18  9:52 AM  Result Value Ref Range   Magnesium 2.0 1.7 - 2.4 mg/dL    Comment: Performed at [facility], [address] [place], [place] [address]  Glucose, capillary     Status: Abnormal   Collection Time: 07/15/18 11:22 AM  Result Value Ref Range   Glucose-Capillary 274 (H) 70 - 99 mg/dL  Glucose, capillary     Status: Abnormal   Collection Time: 07/15/18  4:19 PM  Result Value Ref Range   Glucose-Capillary 288 (H) 70 - 99 mg/dL  Glucose, capillary     Status: Abnormal   Collection Time: 07/15/18  9:28 PM  Result Value Ref Range   Glucose-Capillary 324 (H) 70 - 99 mg/dL  Basic metabolic panel     Status: Abnormal   Collection Time: 07/16/18  6:32 AM  Result Value Ref Range   Sodium 136 135 - 145 mmol/L   Potassium 3.9 3.5 - 5.1 mmol/L   Chloride 105 98 - 111 mmol/L   CO2 23 22 - 32 mmol/L   Glucose, Bld 230 (H) 70 - 99 mg/dL   BUN 13 6 - 20 mg/dL   Creatinine, Ser 1.47 0.61 - 1.24 mg/dL   Calcium 9.0 8.9 - 82.9 mg/dL   GFR calc non Af Amer >60 >60 mL/min   GFR calc Af Amer >60  >60 mL/min   Anion gap 8 5 - 15    Comment: Performed at [facility], [address] [place], [place] [address]  CBC     Status: None   Collection Time: 07/16/18  6:32 AM  Result Value Ref Range   WBC 8.2 4.0 - 10.5 K/uL   RBC 4.91 4.22 - 5.81 MIL/uL   Hemoglobin 15.1 13.0 - 17.0 g/dL   HCT 08.6 57.8 - 46.9 %   MCV 88.0 80.0 - 100.0 fL   MCH 30.8 26.0 - 34.0 pg   MCHC 35.0 30.0 - 36.0 g/dL   RDW 62.9 52.8 - 41.3 %   Platelets 211 150 - 400 K/uL   nRBC 0.0 0.0 - 0.2 %    Comment: Performed at [facility], [address], [place], [place] [address]  Magnesium     Status: None   Collection Time: 07/16/18  6:32 AM  Result Value Ref Range  Magnesium 1.9 1.7 - 2.4 mg/dL    Comment: Performed at [facility], [address] [place], [place] [address]  Glucose, capillary     Status: Abnormal   Collection Time: 07/16/18  6:58 AM  Result Value Ref Range   Glucose-Capillary 255 (H) 70 - 99 mg/dL  Glucose, capillary     Status: Abnormal   Collection Time: 07/16/18 11:17 AM  Result Value Ref Range   Glucose-Capillary 312 (H) 70 - 99 mg/dL    Blood Alcohol level:  Lab Results  Component Value Date   ETH <10 07/14/2018   ETH <5 04/15/2016    Metabolic Disorder Labs: Lab Results  Component Value Date   HGBA1C 10.4 (H) 07/14/2018   MPG 251.78 07/14/2018   No results found for: PROLACTIN Lab Results  Component Value Date   CHOL 140 02/24/2015   TRIG 89 02/24/2015   HDL 32 (L) 02/24/2015   CHOLHDL 4.4 02/24/2015   VLDL 18 02/24/2015   LDLCALC 90 02/24/2015   LDLCALC 44 11/29/2011    Physical Findings: AIMS:  , ,  ,  ,    CIWA:  CIWA-Ar Total: 0 COWS:     Musculoskeletal: Strength & Muscle Tone: within normal limits Gait & Station: normal Patient leans: N/A  Psychiatric Specialty Exam: Physical Exam   ROS   Blood pressure 124/86, pulse 97, temperature 97.9 F (36.6 C), temperature source Oral, resp. rate 18, height 6' (1.829 m),  weight 113.4 kg, SpO2 99 %.Body mass index is 33.91 kg/m.  General Appearance: Fairly Groomed  Eye Contact:  Minimal  Speech:  Slow  Volume:  Decreased  Mood:  Anxious and Depressed  Affect:  Appropriate, Blunt and Congruent  Thought Process:  Goal Directed  Orientation:  Full (Time, Place, and Person)  Thought Content:  Logical  Suicidal Thoughts:  Yes.  without intent/plan  Homicidal Thoughts:  No  Memory:  Immediate;   Fair  Judgement:  Fair  Insight:  Fair  Psychomotor Activity:  Decreased  Concentration:  Concentration: Fair  Recall:  F[employer]Fair  Fund of Knowledge:  Fair  Language:  Fair  Akathisia:  Negative  Handed:    AIMS (if indicated):     Assets:  Communication Skills  ADL's:  Intact  Cognition:  WNL  Sleep:  Number of Hours: 7.16    Treatment Plan Summary: Daily contact with patient to assess and evaluate symptoms and progress in treatment and Medication management   # MDD: -- continue Lexapro 10mg  qhs -- continue Buspar 15mg  TID -- switch temazepam to Trazodone 100mg  qhs prn.   # Cocaine use disorder -- substance abuse counseling.   # DM, newly diagnosed -- follow Internal Med and diabetes coordinator's recommendations.   # Disposition -- defer to primary team.    Sheila Ocasio, MD 07/16/2018, 1:44 PM

## 2018-07-16 NOTE — B[facility] Group Notes (Signed)
LCSW Group Therapy Note 07/16/2018 1:15pm  Type of Therapy and Topic: Group Therapy: Feelings Around Returning Home & Establishing a Supportive Framework and Supporting Oneself When Supports Not Available  Participation Level: Did Not Attend  Description of Group:  Patients first processed thoughts and feelings about upcoming discharge. These included fears of upcoming changes, lack of change, new living environments, judgements and expectations from others and overall stigma of mental health issues. The group then discussed the definition of a supportive framework, what that looks and feels like, and how do to discern it from an unhealthy non-supportive network. The group identified different types of supports as well as what to do when your family/friends are less than helpful or unavailable  Therapeutic Goals  1. Patient will identify one healthy supportive network that they can use at discharge. 2. Patient will identify one factor of a supportive framework and how to tell it from an unhealthy network. 3. Patient able to identify one coping skill to use when they do not have positive supports from others. 4. Patient will demonstrate ability to communicate their needs through discussion and/or role plays.  Summary of Patient Progress:  Pt was invited to attend group but chose not to attend. CSW will continue to encourage pt to attend group throughout their admission.   Therapeutic Modalities Cognitive Behavioral Therapy Motivational Interviewing   Daniel [name]  CUEBAS-COLON, LCSW 07/16/2018 11:57 AM

## 2018-07-16 NOTE — Progress Notes (Signed)
[facility] - [place] [facility]   PATIENT NAME: Daniel [name]    MR#:  [id number]  DATE OF BIRTH:  November 12, 1965  SUBJECTIVE:  CHIEF COMPLAINT: Medical service consulted for management of high blood sugars  No new complaints overnight apart from some headache for which patient was given Motrin.  Blood sugar still greater than 300s.  Changes made to insulin regimen as outlined below.  REVIEW OF SYSTEMS:  Review of Systems  Constitutional: Negative for chills and fever.  HENT: Negative for hearing loss and tinnitus.   Eyes: Negative for blurred vision and double vision.  Respiratory: Negative for cough, hemoptysis and shortness of breath.   Cardiovascular: Negative for chest pain and palpitations.  Gastrointestinal: Negative for heartburn, nausea and vomiting.  Genitourinary: Negative for dysuria and urgency.  Musculoskeletal: Negative for myalgias and neck pain.  Skin: Negative for itching and rash.  Neurological: Negative for dizziness and headaches.  Psychiatric/Behavioral: Negative for depression and hallucinations.    DRUG ALLERGIES:   Allergies  Allergen Reactions  . Hydrocodone-Acetaminophen Hives    Vicodin/Norco  . Suboxone [Buprenorphine Hcl-Naloxone Hcl] Other (See Comments)    Urinary retention   VITALS:  Blood pressure 124/86, pulse 97, temperature 97.9 F (36.6 C), temperature source Oral, resp. rate 18, height 6' (1.829 m), weight 113.4 kg, SpO2 99 %. PHYSICAL EXAMINATION:   Physical Exam  Constitutional: He is oriented to person, place, and time. He appears well-developed.  HENT:  Head: Normocephalic and atraumatic.  Right Ear: External ear normal.  Eyes: Pupils are equal, round, and reactive to light. Conjunctivae are normal. Right eye exhibits no discharge.  Neck: Normal range of motion. Neck supple. No thyromegaly present.  Cardiovascular: Normal rate, regular rhythm and normal heart sounds.  Respiratory: Effort normal. No  respiratory distress.  GI: Soft. Bowel sounds are normal. There is no abdominal tenderness.  Musculoskeletal: Normal range of motion.        General: No edema.  Neurological: He is alert and oriented to person, place, and time. No cranial nerve deficit.  Skin: Skin is warm. He is not diaphoretic. No erythema.  Psychiatric: He has a normal mood and affect. His behavior is normal.   LABORATORY PANEL:  Male CBC Recent Labs  Lab 07/16/18 0632  WBC 8.2  HGB 15.1  HCT 43.2  PLT 211   ------------------------------------------------------------------------------------------------------------------ Chemistries  Recent Labs  Lab 07/14/18 0328  07/16/18 0632  NA 139   < > 136  K 3.3*   < > 3.9  CL 105   < > 105  CO2 23   < > 23  GLUCOSE 343*   < > 230*  BUN 6   < > 13  CREATININE 0.70   < > 0.65  CALCIUM 9.0   < > 9.0  MG 1.9   < > 1.9  AST 39  --   --   ALT 73*  --   --   ALKPHOS 98  --   --   BILITOT 0.5  --   --    < > = values in this interval not displayed.   RADIOLOGY:  No results found. ASSESSMENT AND PLAN:   1.  Newly diagnosed diabetes mellitus with acute hyperglycemia With history of polyuria, intermittent blurry vision, nocturia.  Glycosylated hemoglobin level of 10.4. Blood sugars still uncontrolled and occasionally up to 300.  Increased Lantus insulin from 22 to 26 units daily.  Increased pre-meal NovoLog insulin from 4 units to 6  units 3 times daily . Continue sliding scale insulin coverage. Monitor blood sugars and adjust regimen as needed.  2. Hypokalemia; replaced.   3. History of hypertension Blood pressure controlled  4. Acute dual diagnosis with depression and substance abuse Management per primary service  5. History of urinary retention Stable Conservative medical management  DVT prophylaxis; patient is ambulatory on the psych floor.   All the records are reviewed and case discussed with Care Management/Social Worker. Management plans  discussed with the patient, family and they are in agreement.  CODE STATUS: Full Code  TOTAL TIME TAKING CARE OF THIS PATIENT: 36 minutes.   More than 50% of the time was spent in counseling/coordination of care: YES  POSSIBLE D/C IN 2 DAYS, DEPENDING ON CLINICAL CONDITION.   Daniel [name] M.D on 07/16/2018 at 2:04 PM  Between 7am to 6pm - Pager - [phone]  After 6pm go to [email or web address] - Social research officer, government  [facility] [place] [facility]  Office  [phone]  CC: Primary care physician; System, Pcp Not In  Note: This dictation was prepared with Dragon dictation along with smaller phrase technology. Any transcriptional errors that result from this process are unintentional.

## 2018-07-16 NOTE — Plan of Care (Signed)
  Problem: Education: Goal: Knowledge of [facility] Education information/materials will improve Outcome: Progressing   Problem: Health Behavior/Discharge Planning: Goal: Identification of resources available to assist in meeting health care needs will improve Outcome: Progressing Goal: Compliance with treatment plan for underlying cause of condition will improve Outcome: Progressing   Problem: Coping: Goal: Coping ability will improve Outcome: Progressing Goal: Will verbalize feelings Outcome: Progressing   Problem: Role Relationship: Goal: Will demonstrate positive changes in social behaviors and relationships Outcome: Progressing

## 2018-07-16 NOTE — Plan of Care (Signed)
Patient is A&O present in the milieu for meals and medications. Patient is otherwise in his room with the room resting with his eyes closed. BS checked this shift with insulin administered based on results. Patient denies SI/HI/AVH and pain at this time but continues to endorse depression and anxiety rating it a 7 today. . Reports that he slept well last night without the use of a sleep aid. Milieu remains safe with q 15 minute safety checks. Will continue to monitor.

## 2018-07-16 NOTE — Progress Notes (Signed)
Patient presented to nurses station stating, "I'm about to flip put, I need something for my anxiety." MD made aware and Buspar 15 mg ordered to combat anxiety. Patient education given  prior to administration.

## 2018-07-16 NOTE — Progress Notes (Signed)
Patient is in the milieu with peers communicating and watch television, nurse education is provided to maintain social distancing among each other, asked patient about his head, denies any headache or pain but continue to endorse anxiety about his brother who is living with him before being hospitalized , patient thinks his brother will sale his television for crack cocaine. Support and encouragement is provided , patient blood sugar is 256 and maintaining. Diabetic diet  Education is provided. Patient denies SI/HI/AVH and no signes of AVH noted. 15 minutes safety checks is in progress.

## 2018-07-17 LAB — GLUCOSE, CAPILLARY
Glucose-Capillary: 334 mg/dL — ABNORMAL HIGH (ref 70–99)
Glucose-Capillary: 246 mg/dL — ABNORMAL HIGH (ref 70–99)
Glucose-Capillary: 353 mg/dL — ABNORMAL HIGH (ref 70–99)
Glucose-Capillary: 300 mg/dL — ABNORMAL HIGH (ref 70–99)

## 2018-07-17 MED ORDER — INSULIN STARTER KIT- SYRINGES (ENGLISH)
1.0000 | Freq: Once | Status: AC
  Administered 2018-07-17: 15:00:00 1
  Filled 2018-07-17: qty 1

## 2018-07-17 MED ORDER — LIVING WELL WITH DIABETES BOOK
Freq: Once | Status: AC
  Administered 2018-07-17: 15:00:00
  Filled 2018-07-17: qty 1

## 2018-07-17 MED ORDER — INSULIN ASPART 100 UNIT/ML ~~LOC~~ SOLN
8.0000 [IU] | Freq: Three times a day (TID) | SUBCUTANEOUS | Status: DC
  Administered 2018-07-17 – 2018-07-18 (×6): 8 [IU] via SUBCUTANEOUS
  Filled 2018-07-17 (×3): qty 1

## 2018-07-17 MED ORDER — INSULIN GLARGINE 100 UNIT/ML ~~LOC~~ SOLN
30.0000 [IU] | Freq: Every day | SUBCUTANEOUS | Status: DC
  Administered 2018-07-17 – 2018-07-18 (×2): 30 [IU] via SUBCUTANEOUS
  Filled 2018-07-17 (×3): qty 0.3

## 2018-07-17 NOTE — Plan of Care (Signed)
Patient present in the milieu sporadically today. Compliant with medications and meals. Pupils equal and reactive to light. Alert and oriented x 4. Denies SI/HI/AVH and pain. Speech is clear and gait remains steady. Received order for diabetic education via booklet and syringe kit. Currently awaiting materials from pharmacy. Milieu otherwise safe, will continue to monitor.

## 2018-07-17 NOTE — Progress Notes (Signed)
Recreation Therapy Notes  INPATIENT RECREATION THERAPY ASSESSMENT  Patient Details Name: Daniel [name] MRN: [id number] DOB: 08-04-1965 Today's Date: 07/17/2018       Information Obtained From: Patient  Able to Participate in Assessment/Interview: Yes  Patient Presentation: Responsive  Reason for Admission (Per Patient): Active Symptoms  Patient Stressors:    Coping Skills:   Talk  Leisure Interests (2+):  (Cars)  Frequency of Recreation/Participation:    Awareness of Community Resources:     [employer]:     Current Use:    If no, Barriers?:    Expressed Interest in [employer] Information:    [employer] of Residence:  Film/video editor  Patient Main Form of Transportation: Set designer  Patient Strengths:  Communication  Patient Identified Areas of Improvement:  Getting better  Patient Goal for Hospitalization:  To stay focused on doing better and going home  Current SI (including self-harm):  No  Current HI:  No  Current AVH: No  Staff Intervention Plan: Group Attendance, Collaborate with Interdisciplinary Treatment Team  Consent to Intern Participation: N/A  [name] 07/17/2018, 12:24 PM

## 2018-07-17 NOTE — Progress Notes (Signed)
Recreation Therapy Notes  Date: 07/17/2018  Time: 9:30 am   Location: Craft room   Behavioral response: N/A   Intervention Topic: Problem Solving   Discussion/Intervention: Patient did not attend group.   Clinical Observations/Feedback:  Patient did not attend group.   [name] LRT/CTRS        Farrie Sann 07/17/2018 11:06 AM

## 2018-07-17 NOTE — Tx Team (Addendum)
Interdisciplinary Treatment and Diagnostic Plan Update  07/17/2018 Time of Session: 1030AM Daniel [name] MRN: [id number]  Principal Diagnosis: MDD (major depressive disorder), severe ([place])  Secondary Diagnoses: Principal Problem:   MDD (major depressive disorder), severe ([place]) Active Problems:   Cocaine abuse ([place])   Current Medications:  Current Facility-Administered Medications  Medication Dose Route Frequency Provider Last Rate Last Dose  . acetaminophen (TYLENOL) tablet 1,000 mg  1,000 mg Oral BID PRN He, Jun, MD   1,000 mg at 07/15/18 1142  . alum & mag hydroxide-simeth (MAALOX/MYLANTA) 200-200-20 MG/5ML suspension 30 mL  30 mL Oral Q4H PRN Lamont Dowdy, NP      . busPIRone (BUSPAR) tablet 15 mg  15 mg Oral TID He, Jun, MD   15 mg at 07/17/18 0272  . diphenhydrAMINE (BENADRYL) capsule 25 mg  25 mg Oral Q6H PRN Lamont Dowdy, NP      . escitalopram (LEXAPRO) tablet 10 mg  10 mg Oral Daily He, Jun, MD   10 mg at 07/17/18 5366  . ibuprofen (ADVIL) tablet 400 mg  400 mg Oral Q6H PRN Solon Palm R, DO   400 mg at 07/16/18 1609  . insulin aspart (novoLOG) injection 0-20 Units  0-20 Units Subcutaneous TID WC Salary, Montell D, MD   7 Units at 07/17/18 1111  . insulin aspart (novoLOG) injection 0-5 Units  0-5 Units Subcutaneous QHS Loney Hering D, MD   3 Units at 07/16/18 2129  . insulin aspart (novoLOG) injection 8 Units  8 Units Subcutaneous TID WC Stark Jock, Jude, MD   8 Units at 07/17/18 1112  . insulin glargine (LANTUS) injection 30 Units  30 Units Subcutaneous Daily Stark Jock, Jude, MD   30 Units at 07/17/18 0827  . insulin starter kit- syringes (English) 1 kit  1 kit Other Once Clapacs, John T, MD      . living well with diabetes book MISC   Does not apply Once Clapacs, Madie Reno, MD      . magnesium hydroxide (MILK OF MAGNESIA) suspension 30 mL  30 mL Oral Daily PRN Thomspon, Geni Bers, NP      . nicotine (NICODERM CQ - dosed in mg/24 hours) patch 21 mg  21 mg Transdermal  Daily Clapacs, Madie Reno, MD   21 mg at 07/17/18 4403  . traZODone (DESYREL) tablet 100 mg  100 mg Oral QHS PRN He, Jun, MD   100 mg at 07/16/18 2125   PTA Medications: Medications Prior to Admission  Medication Sig Dispense Refill Last Dose  . amitriptyline (ELAVIL) 50 MG tablet Take 200 mg by mouth at bedtime. Reported on 07/25/2015. On Hold at Pharmacy    Taking  . baclofen (LIORESAL) 10 MG tablet Take 1 tablet (10 mg total) by mouth 3 (three) times daily. 90 each 1 Taking  . busPIRone (BUSPAR) 15 MG tablet Take 15 mg by mouth 3 (three) times daily.  0 Taking  . escitalopram (LEXAPRO) 20 MG tablet Take by mouth.   Taking  . tiZANidine (ZANAFLEX) 4 MG tablet Take 1 tablet (4 mg total) by mouth every 8 (eight) hours as needed for muscle spasms. (Patient not taking: Reported on 02/06/2018) 90 tablet 2 Not Taking  . traZODone (DESYREL) 100 MG tablet take 2 tablet by mouth at bedtime  0 Taking    Patient Stressors: Legal issue Marital or family conflict Substance abuse  Patient Strengths: Ability for insight Average or above average intelligence Communication skills  Treatment Modalities: Medication Management, Group therapy, Case management,  1  to 1 session with clinician, Psychoeducation, Recreational therapy.   Physician Treatment Plan for Primary Diagnosis: MDD (major depressive disorder), severe ([place]) Long Term Goal(s): Improvement in symptoms so as ready for discharge Improvement in symptoms so as ready for discharge   Short Term Goals: Ability to verbalize feelings will improve Ability to disclose and discuss suicidal ideas Ability to demonstrate self-control will improve Ability to identify changes in lifestyle to reduce recurrence of condition will improve Ability to identify triggers associated with substance abuse/mental health issues will improve  Medication Management: Evaluate patient's response, side effects, and tolerance of medication regimen.  Therapeutic  Interventions: 1 to 1 sessions, Unit Group sessions and Medication administration.  Evaluation of Outcomes: Progressing  Physician Treatment Plan for Secondary Diagnosis: Principal Problem:   MDD (major depressive disorder), severe ([place]) Active Problems:   Cocaine abuse ([place])  Long Term Goal(s): Improvement in symptoms so as ready for discharge Improvement in symptoms so as ready for discharge   Short Term Goals: Ability to verbalize feelings will improve Ability to disclose and discuss suicidal ideas Ability to demonstrate self-control will improve Ability to identify changes in lifestyle to reduce recurrence of condition will improve Ability to identify triggers associated with substance abuse/mental health issues will improve     Medication Management: Evaluate patient's response, side effects, and tolerance of medication regimen.  Therapeutic Interventions: 1 to 1 sessions, Unit Group sessions and Medication administration.  Evaluation of Outcomes: Progressing   RN Treatment Plan for Primary Diagnosis: MDD (major depressive disorder), severe ([place]) Long Term Goal(s): Knowledge of disease and therapeutic regimen to maintain health will improve  Short Term Goals: Ability to demonstrate self-control, Ability to participate in decision making will improve and Ability to identify and develop effective coping behaviors will improve  Medication Management: RN will administer medications as ordered by provider, will assess and evaluate patient's response and provide education to patient for prescribed medication. RN will report any adverse and/or side effects to prescribing provider.  Therapeutic Interventions: 1 on 1 counseling sessions, Psychoeducation, Medication administration, Evaluate responses to treatment, Monitor vital signs and CBGs as ordered, Perform/monitor CIWA, COWS, AIMS and Fall Risk screenings as ordered, Perform wound care treatments as ordered.  Evaluation of  Outcomes: Progressing   LCSW Treatment Plan for Primary Diagnosis: MDD (major depressive disorder), severe ([place]) Long Term Goal(s): Safe transition to appropriate next level of care at discharge, Engage patient in therapeutic group addressing interpersonal concerns.  Short Term Goals: Engage patient in aftercare planning with referrals and resources, Increase social support, Increase emotional regulation and Increase skills for wellness and recovery  Therapeutic Interventions: Assess for all discharge needs, 1 to 1 time with Social worker, Explore available resources and support systems, Assess for adequacy in community support network, Educate family and significant other(s) on suicide prevention, Complete Psychosocial Assessment, Interpersonal group therapy.  Evaluation of Outcomes: Progressing   Progress in Treatment: Attending groups: No. Participating in groups: No. Taking medication as prescribed: Yes. Toleration medication: Yes. Family/Significant other contact made: No, will contact:  pt declined collateral contact Patient understands diagnosis: Yes. Discussing patient identified problems/goals with staff: Yes. Medical problems stabilized or resolved: Yes. Denies suicidal/homicidal ideation: Yes. Issues/concerns per patient self-inventory: No. Other: N/A  New problem(s) identified: No, Describe:  none  New Short Term/Long Term Goal(s): medication management for mood stabilization;  development of comprehensive mental wellness/sobriety plan.   Patient Goals:  "stay focused on doing better and be able to go home and do good"  Discharge Plan  or Barriers: SPE pamphlet, Mobile Crisis information, and AA/NA information provided to patient for additional community support and resources. Pt has a follow up appointment with [facility] on 07/26/18 at 11AM with Dr. Ladoris Gene.  Reason for Continuation of Hospitalization: Anxiety Depression Medication  stabilization  Estimated Length of Stay: Tomorrow 07/18/2018   Recreational Therapy: Patient Stressors: N/A  Patient Goal: Patient will engage in groups without prompting or encouragement from LRT x3 group sessions within 5 recreation therapy group sessions  Attendees: Patient: Daniel [name] 07/17/2018 11:23 AM  Physician: Dr Weber Cooks MD 07/17/2018 11:23 AM  Nursing: Lyda Kalata RN 07/17/2018 11:23 AM  RN Care Manager: 07/17/2018 11:23 AM  Social Worker: Minette Brine Moton LCSW 07/17/2018 11:23 AM  Recreational Therapist: Roanna Epley CTRS LRT 07/17/2018 11:23 AM  Other: Assunta Curtis LCSW 07/17/2018 11:23 AM  Other: Sanjuana Kava LCSW 07/17/2018 11:23 AM  Other: 07/17/2018 11:23 AM    Scribe for Treatment Team: [place], LCSW 07/17/2018 11:23 AM

## 2018-07-17 NOTE — [facility] Group Notes (Signed)
LCSW Group Therapy Note   07/17/2018 1:00 PM  Type of Therapy and Topic:  Group Therapy:  Overcoming Obstacles   Participation Level:  Did Not Attend   Description of Group:    In this group patients will be encouraged to explore what they see as obstacles to their own wellness and recovery. They will be guided to discuss their thoughts, feelings, and behaviors related to these obstacles. The group will process together ways to cope with barriers, with attention given to specific choices patients can make. Each patient will be challenged to identify changes they are motivated to make in order to overcome their obstacles. This group will be process-oriented, with patients participating in exploration of their own experiences as well as giving and receiving support and challenge from other group members.   Therapeutic Goals: 1. Patient will identify personal and current obstacles as they relate to admission. 2. Patient will identify barriers that currently interfere with their wellness or overcoming obstacles.  3. Patient will identify feelings, thought process and behaviors related to these barriers. 4. Patient will identify two changes they are willing to make to overcome these obstacles:      Summary of Patient Progress X   Therapeutic Modalities:   Cognitive Behavioral Therapy Solution Focused Therapy Motivational Interviewing Relapse Prevention Therapy  Penni Homans, MSW, LCSW 07/17/2018 12:47 PM

## 2018-07-17 NOTE — Progress Notes (Signed)
[facility] [place] [facility]   PATIENT NAME: Daniel [name]    MR#:  [id number]  DATE OF BIRTH:  1965-04-19  SUBJECTIVE:  CHIEF COMPLAINT: Medical service consulted for management of high blood sugars  No new complaints overnight .  Blood sugars noted to be uncontrolled.  Nursing staff however confirmed patient had not been compliant with diabetic diet.  Had been taking lots of sugar in the dining room.  Diabetic education was reemphasized and importance of compliance with diabetic diet reemphasized.  REVIEW OF SYSTEMS:  Review of Systems  Constitutional: Negative for chills and fever.  HENT: Negative for hearing loss and tinnitus.   Eyes: Negative for blurred vision and double vision.  Respiratory: Negative for cough, hemoptysis and shortness of breath.   Cardiovascular: Negative for chest pain and palpitations.  Gastrointestinal: Negative for heartburn, nausea and vomiting.  Genitourinary: Negative for dysuria and urgency.  Musculoskeletal: Negative for myalgias and neck pain.  Skin: Negative for itching and rash.  Neurological: Negative for dizziness and headaches.  Psychiatric/Behavioral: Negative for depression and hallucinations.    DRUG ALLERGIES:   Allergies  Allergen Reactions  . Hydrocodone-Acetaminophen Hives    Vicodin/Norco  . Suboxone [Buprenorphine Hcl-Naloxone Hcl] Other (See Comments)    Urinary retention   VITALS:  Blood pressure 130/90, pulse 97, temperature 98.4 F (36.9 C), temperature source Oral, resp. rate 18, height 6' (1.829 m), weight 113.4 kg, SpO2 97 %. PHYSICAL EXAMINATION:   Physical Exam  Constitutional: He is oriented to person, place, and time. He appears well-developed.  HENT:  Head: Normocephalic and atraumatic.  Right Ear: External ear normal.  Eyes: Pupils are equal, round, and reactive to light. Conjunctivae are normal. Right eye exhibits no discharge.  Neck: Normal range of motion. Neck supple. No  thyromegaly present.  Cardiovascular: Normal rate, regular rhythm and normal heart sounds.  Respiratory: Effort normal. No respiratory distress.  GI: Soft. Bowel sounds are normal. There is no abdominal tenderness.  Musculoskeletal: Normal range of motion.        General: No edema.  Neurological: He is alert and oriented to person, place, and time. No cranial nerve deficit.  Skin: Skin is warm. He is not diaphoretic. No erythema.  Psychiatric: He has a normal mood and affect. His behavior is normal.   LABORATORY PANEL:  Male CBC Recent Labs  Lab 07/16/18 0632  WBC 8.2  HGB 15.1  HCT 43.2  PLT 211   ------------------------------------------------------------------------------------------------------------------ Chemistries  Recent Labs  Lab 07/14/18 0328  07/16/18 0632  NA 139   < > 136  K 3.3*   < > 3.9  CL 105   < > 105  CO2 23   < > 23  GLUCOSE 343*   < > 230*  BUN 6   < > 13  CREATININE 0.70   < > 0.65  CALCIUM 9.0   < > 9.0  MG 1.9   < > 1.9  AST 39  --   --   ALT 73*  --   --   ALKPHOS 98  --   --   BILITOT 0.5  --   --    < > = values in this interval not displayed.   RADIOLOGY:  No results found. ASSESSMENT AND PLAN:   1.  Newly diagnosed diabetes mellitus with acute hyperglycemia With history of polyuria, intermittent blurry vision, nocturia.  Glycosylated hemoglobin level of 10.4. Blood sugars still uncontrolled and occasionally up to 300 secondary  to noncompliance with diabetic diet. Education done on importance of compliance with diabetic diet..  Increased Lantus insulin from 26 to 30 units daily.  Increased pre-meal NovoLog insulin from 6 units to 8 units 3 times daily . Continue sliding scale insulin coverage. Monitor blood sugars and adjust regimen as needed.  2. Hypokalemia; replaced.   3. History of hypertension Blood pressure controlled  4. Acute dual diagnosis with depression and substance abuse Management per primary service  5.  History of urinary retention Stable Conservative medical management  DVT prophylaxis; patient is ambulatory on the psych floor.   All the records are reviewed and case discussed with Care Management/Social Worker. Management plans discussed with the patient, family and they are in agreement.  CODE STATUS: Full Code  TOTAL TIME TAKING CARE OF THIS PATIENT: 33 minutes.   More than 50% of the time was spent in counseling/coordination of care: YES  POSSIBLE D/C IN 2 DAYS, DEPENDING ON CLINICAL CONDITION.   Keidrick Murty M.D on 07/17/2018 at 1:34 PM  Between 7am to 6pm - Pager - [phone]  After 6pm go to [email or web address] - Social research officer, governmentpassword [email or web address]  [facility]  Office  [phone]  CC: Primary care physician; System, Pcp Not In  Note: This dictation was prepared with Dragon dictation along with smaller phrase technology. Any transcriptional errors that result from this process are unintentional.

## 2018-07-17 NOTE — Progress Notes (Signed)
[facility] MD Progress Note  07/17/2018 4:08 PM Daniel [name]  MRN:  [id number] Subjective: Follow-up for this patient with depression and substance abuse.  Patient seen chart reviewed.  Patient remains disheveled withdrawn and flat most of the time.  Tells me his mood still feels stressed out.  He admits to still having suicidal thoughts but with no specific plan.  Feels overwhelmed by his problems outside the hospital.  Patient's blood sugars continue to run very high despite intervention with insulin.  Patient remains disheveled with little interaction on the unit.  He was started on Lexapro over the weekend despite EKG still showing a prolonged QT. Principal Problem: MDD (major depressive disorder), severe (HCC) Diagnosis: Principal Problem:   MDD (major depressive disorder), severe (HCC) Active Problems:   Cocaine abuse (HCC)  Total Time spent with patient: 30 minutes  Past Psychiatric History: Long history of alcohol and other drug abuse including opiate abuse.  History of suicidal depression  Past Medical History:  Past Medical History:  Diagnosis Date  . Allergy   . Anxiety   . Back pain   . Chronic pain    2/2 accident/trauma  . Hypertension   . Opioid abuse (HCC)   . Urinary retention     Past Surgical History:  Procedure Laterality Date  . BACK SURGERY    . KNEE SURGERY Left    Family History:  Family History  Problem Relation Age of Onset  . Alcohol abuse Father    Family Psychiatric  History: See previous.  Substance abuse. Social History:  Social History   Substance and Sexual Activity  Alcohol Use No     Social History   Substance and Sexual Activity  Drug Use No   Comment: Former use of cocaine and opioids    Social History   Socioeconomic History  . Marital status: Divorced    Spouse name: Not on file  . Number of children: Not on file  . Years of education: Not on file  . Highest education level: Not on file  Occupational History  . Not on file   Social Needs  . Financial resource strain: Not on file  . Food insecurity:    Worry: Not on file    Inability: Not on file  . Transportation needs:    Medical: Not on file    Non-medical: Not on file  Tobacco Use  . Smoking status: Current Every Day Smoker    Packs/day: 1.00    Types: E-cigarettes, Cigarettes    Last attempt to quit: 03/16/2015    Years since quitting: 3.3  . Smokeless tobacco: Never Used  Substance and Sexual Activity  . Alcohol use: No  . Drug use: No    Comment: Former use of cocaine and opioids  . Sexual activity: Not on file  Lifestyle  . Physical activity:    Days per week: Not on file    Minutes per session: Not on file  . Stress: Not on file  Relationships  . Social connections:    Talks on phone: Not on file    Gets together: Not on file    Attends religious service: Not on file    Active member of club or organization: Not on file    Attends meetings of clubs or organizations: Not on file    Relationship status: Not on file  Other Topics Concern  . Not on file  Social History Narrative  . Not on file   Additional Social History:  Sleep: Fair  Appetite:  Fair  Current Medications: Current Facility-Administered Medications  Medication Dose Route Frequency Provider Last Rate Last Dose  . acetaminophen (TYLENOL) tablet 1,000 mg  1,000 mg Oral BID PRN He, Jun, MD   1,000 mg at 07/15/18 1142  . alum & mag hydroxide-simeth (MAALOX/MYLANTA) 200-200-20 MG/5ML suspension 30 mL  30 mL Oral Q4H PRN Catalina Gravel, NP      . busPIRone (BUSPAR) tablet 15 mg  15 mg Oral TID He, Jun, MD   15 mg at 07/17/18 1134  . diphenhydrAMINE (BENADRYL) capsule 25 mg  25 mg Oral Q6H PRN Catalina Gravel, NP      . escitalopram (LEXAPRO) tablet 10 mg  10 mg Oral Daily He, Jun, MD   10 mg at 07/17/18 1610  . ibuprofen (ADVIL) tablet 400 mg  400 mg Oral Q6H PRN Donata Clay R, DO   400 mg at 07/16/18 1609  . insulin aspart  (novoLOG) injection 0-20 Units  0-20 Units Subcutaneous TID WC Salary, Montell D, MD   7 Units at 07/17/18 1111  . insulin aspart (novoLOG) injection 0-5 Units  0-5 Units Subcutaneous QHS Angelina Ok D, MD   3 Units at 07/16/18 2129  . insulin aspart (novoLOG) injection 8 Units  8 Units Subcutaneous TID WC Enid Baas, Jude, MD   8 Units at 07/17/18 1112  . insulin glargine (LANTUS) injection 30 Units  30 Units Subcutaneous Daily Enid Baas, Jude, MD   30 Units at 07/17/18 0827  . magnesium hydroxide (MILK OF MAGNESIA) suspension 30 mL  30 mL Oral Daily PRN Catalina Gravel, NP      . nicotine (NICODERM CQ - dosed in mg/24 hours) patch 21 mg  21 mg Transdermal Daily Matsuko Kretz, Jackquline Denmark, MD   21 mg at 07/17/18 9604  . traZODone (DESYREL) tablet 100 mg  100 mg Oral QHS PRN He, Jun, MD   100 mg at 07/16/18 2125    Lab Results:  Results for orders placed or performed during the hospital encounter of 07/14/18 (from the past 48 hour(s))  Glucose, capillary     Status: Abnormal   Collection Time: 07/15/18  4:19 PM  Result Value Ref Range   Glucose-Capillary 288 (H) 70 - 99 mg/dL  Glucose, capillary     Status: Abnormal   Collection Time: 07/15/18  9:28 PM  Result Value Ref Range   Glucose-Capillary 324 (H) 70 - 99 mg/dL  Basic metabolic panel     Status: Abnormal   Collection Time: 07/16/18  6:32 AM  Result Value Ref Range   Sodium 136 135 - 145 mmol/L   Potassium 3.9 3.5 - 5.1 mmol/L   Chloride 105 98 - 111 mmol/L   CO2 23 22 - 32 mmol/L   Glucose, Bld 230 (H) 70 - 99 mg/dL   BUN 13 6 - 20 mg/dL   Creatinine, Ser 5.40 0.61 - 1.24 mg/dL   Calcium 9.0 8.9 - 98.1 mg/dL   GFR calc non Af Amer >60 >60 mL/min   GFR calc Af Amer >60 >60 mL/min   Anion gap 8 5 - 15    Comment: Performed at [facility], [address] [place], [place] [address]  CBC     Status: None   Collection Time: 07/16/18  6:32 AM  Result Value Ref Range   WBC 8.2 4.0 - 10.5 K/uL   RBC 4.91 4.22 - 5.81 MIL/uL    Hemoglobin 15.1 13.0 - 17.0 g/dL   HCT 82.9 56.2 -  52.0 %   MCV 88.0 80.0 - 100.0 fL   MCH 30.8 26.0 - 34.0 pg   MCHC 35.0 30.0 - 36.0 g/dL   RDW 16.112.3 09.611.5 - 04.515.5 %   Platelets 211 150 - 400 K/uL   nRBC 0.0 0.0 - 0.2 %    Comment: Performed at [facility], [address] [place], [place] [address]  Magnesium     Status: None   Collection Time: 07/16/18  6:32 AM  Result Value Ref Range   Magnesium 1.9 1.7 - 2.4 mg/dL    Comment: Performed at [facility], [address] [place], [place] [address]  Glucose, capillary     Status: Abnormal   Collection Time: 07/16/18  6:58 AM  Result Value Ref Range   Glucose-Capillary 255 (H) 70 - 99 mg/dL  Glucose, capillary     Status: Abnormal   Collection Time: 07/16/18 11:17 AM  Result Value Ref Range   Glucose-Capillary 312 (H) 70 - 99 mg/dL  Glucose, capillary     Status: Abnormal   Collection Time: 07/16/18  4:13 PM  Result Value Ref Range   Glucose-Capillary 286 (H) 70 - 99 mg/dL  Glucose, capillary     Status: Abnormal   Collection Time: 07/16/18  9:04 PM  Result Value Ref Range   Glucose-Capillary 256 (H) 70 - 99 mg/dL  Glucose, capillary     Status: Abnormal   Collection Time: 07/17/18  6:58 AM  Result Value Ref Range   Glucose-Capillary 334 (H) 70 - 99 mg/dL  Glucose, capillary     Status: Abnormal   Collection Time: 07/17/18 11:09 AM  Result Value Ref Range   Glucose-Capillary 246 (H) 70 - 99 mg/dL   Comment 1 Notify RN     Blood Alcohol level:  Lab Results  Component Value Date   ETH <10 07/14/2018   ETH <5 04/15/2016    Metabolic Disorder Labs: Lab Results  Component Value Date   HGBA1C 10.4 (H) 07/14/2018   MPG 251.78 07/14/2018   No results found for: PROLACTIN Lab Results  Component Value Date   CHOL 140 02/24/2015   TRIG 89 02/24/2015   HDL 32 (L) 02/24/2015   CHOLHDL 4.4 02/24/2015   VLDL 18 02/24/2015   LDLCALC 90 02/24/2015   LDLCALC 44 11/29/2011    Physical Findings: AIMS:   , ,  ,  ,    CIWA:  CIWA-Ar Total: 0 COWS:     Musculoskeletal: Strength & Muscle Tone: within normal limits Gait & Station: normal Patient leans: N/A  Psychiatric Specialty Exam: Physical Exam  Nursing note and vitals reviewed. Constitutional: He appears well-developed and well-nourished.  HENT:  Head: Normocephalic and atraumatic.  Eyes: Pupils are equal, round, and reactive to light. Conjunctivae are normal.  Neck: Normal range of motion.  Cardiovascular: Regular rhythm and normal heart sounds.  Respiratory: Effort normal. No respiratory distress.  GI: Soft.  Musculoskeletal: Normal range of motion.  Neurological: He is alert.  Skin: Skin is warm and dry.  Psychiatric: His affect is blunt. His speech is delayed. He is slowed. Cognition and memory are impaired. He expresses impulsivity. He expresses suicidal ideation. He expresses no suicidal plans.    Review of Systems  Constitutional: Negative.   HENT: Negative.   Eyes: Negative.   Respiratory: Negative.   Cardiovascular: Negative.   Gastrointestinal: Negative.   Musculoskeletal: Negative.   Skin: Negative.   Neurological: Negative.   Psychiatric/Behavioral: Positive for depression, substance abuse and suicidal  ideas. Negative for hallucinations and memory loss. The patient is nervous/anxious and has insomnia.     Blood pressure 109/83, pulse 91, temperature 98.1 F (36.7 C), temperature source Oral, resp. rate 18, height 6' (1.829 m), weight 113.4 kg, SpO2 95 %.Body mass index is 33.91 kg/m.  General Appearance: Disheveled  Eye Contact:  Minimal  Speech:  Slow  Volume:  Decreased  Mood:  Depressed  Affect:  Congruent  Thought Process:  Goal Directed  Orientation:  Full (Time, Place, and Person)  Thought Content:  Rumination  Suicidal Thoughts:  Yes.  without intent/plan  Homicidal Thoughts:  No  Memory:  Immediate;   Fair Recent;   Fair Remote;   Fair  Judgement:  Impaired  Insight:  Shallow  Psychomotor  Activity:  Decreased  Concentration:  Concentration: Fair  Recall:  [employer] of Knowledge:  Fair  Language:  Fair  Akathisia:  No  Handed:  Right  AIMS (if indicated):     Assets:  Desire for Improvement  ADL's:  Impaired  Cognition:  Impaired,  Mild  Sleep:  Number of Hours: 3.75     Treatment Plan Summary: Daily contact with patient to assess and evaluate symptoms and progress in treatment, Medication management and Plan Patient remains depressed with passive suicidal ideation hopelessness withdrawal poor sleep.  Little motivation.  Diabetes poorly controlled despite starting insulin.  Will need insulin at discharge.  Likely length of stay still probably at least 3 days.  Daniel Rasmussen, MD 07/17/2018, 4:08 PM

## 2018-07-17 NOTE — Progress Notes (Addendum)
Inpatient Diabetes Program Recommendations  AACE/ADA: New Consensus Statement on Inpatient Glycemic Control   Target Ranges:  Prepandial:   less than 140 mg/dL      Peak postprandial:   less than 180 mg/dL (1-2 hours)      Critically ill patients:  140 - 180 mg/dL  Results for Daniel [name], Daniel [name] (MRN [id number]) as of 07/17/2018 08:30  Ref. Range 07/16/2018 06:58 07/16/2018 11:17 07/16/2018 16:13 07/16/2018 21:04 07/17/2018 06:58  Glucose-Capillary Latest Ref Range: 70 - 99 mg/dL 255 (H) 312 (H) 286 (H) 256 (H) 334 (H)   Results for Daniel [name], Daniel [name] (MRN [id number]) as of 07/17/2018 08:30  Ref. Range 07/14/2018 03:28  Hemoglobin A1C Latest Ref Range: 4.8 - 5.6 % 10.4 (H)   Review of Glycemic Control  Diabetes history: No Outpatient Diabetes medications: NA Current orders for Inpatient glycemic control: Lantus 30 units daily, Novolog 8 units TID with meals, Novolog 0-20 units TID with meals, Novolog 0-5 units QHS  Inpatient Diabetes Program Recommendations:   HgbA1C: A1C 10.4% on 07/14/18 indicating an average glucose of 252 mg/dl over the past 2-3 months. MD, please indicate in progress note about plan for outpatient DM control (will patient be discharged on insulin or will oral DM medications be used?).   Oral DM medications: Anticipate patient will require insulin as an outpatient but MD may want to use oral DM medications along with insulin. If oral DM medications are effective, perhaps patient could take basal insulin along with oral DM medications.   While inpatient, may want to consider adding Metformin 500 mg BID and Tradjenta 5 mg daily to determine impact on glycemic control.   NOTE: Noted new dx of DM.  Ordered Living Well with DM book, insulin starter kit, and patient education by RNs. Will plan to talk with patient today regarding new DM dx.  Addendum 07/17/18@14 :45-Spoke with patient about new diabetes diagnosis. Patient reports he has never been told he had DM or pre-DM. Patient reports  that he does not have a PCP and will need a provider to follow up with regarding DM management. Patient reports that he has Medicaid insurance.  Discussed A1C results (10.4% on 07/14/18) and explained what an A1C is and informed patient that his current A1C indicates an average glucose of 252 mg/dl over the past 2-3 months. Discussed basic pathophysiology of DM Type 2, basic home care, importance of checking CBGs and maintaining good CBG control to prevent long-term and short-term complications. Reviewed glucose and A1C goals.   Reviewed signs and symptoms of hyperglycemia and hypoglycemia along with treatment for both. Discussed impact of nutrition, exercise, stress, sickness, and medications on diabetes control. Discussed Carb Modified diet, how to read nutrition label, portion control, and encouraged patient to follow Carb Modified diet.  Reviewed Living Well with diabetes booklet and encouraged patient to read through entire book.  Discussed current insulin orders and explained that based on current glucose trends with Lantus and Novolog being given, he will most likely require insulin as an outpatient. Discussed insulin as well as some other oral DM medications and how they work to improve glycemic control.  Patient is agreeable to use insulin as an outpatient if needed.  Reviewed and demonstrated how to draw up and administer insulin with vial and syringe and how to use an insulin pen.  Patient feels that the vial/syringe would be best for him. Patient was able to successfully demonstrate how to draw up insulin with vial/syringe. Informed patient that RN will be asking  him to self-administer insulin to ensure proper technique and ability to administer self insulin shots. Asked patient to check his glucose as MD advises and to keep a log book of glucose readings and insulin taken. Explained how the doctor he follows up with can use the log book to continue to make insulin adjustments if needed. Discussed [facility] and informed patient that CM would be consult to assist with arranging follow up and will be asked to provide patient with a glucometer if they have one available.  Informed patient that RD would be consulted for further diet education.  Patient verbalized understanding of information discussed and he states that he has no further questions at this time related to diabetes.   RNs to provide ongoing basic DM education at bedside with this patient and engage patient to actively check blood glucose and administer insulin injections.    Thanks, Barnie Alderman, RN, MSN, CDE Diabetes Coordinator Inpatient Diabetes Program [phone] (Team Pager from 8am to 5pm)

## 2018-07-18 LAB — GLUCOSE, CAPILLARY
Glucose-Capillary: 282 mg/dL — ABNORMAL HIGH (ref 70–99)
Glucose-Capillary: 253 mg/dL — ABNORMAL HIGH (ref 70–99)
Glucose-Capillary: 225 mg/dL — ABNORMAL HIGH (ref 70–99)
Glucose-Capillary: 255 mg/dL — ABNORMAL HIGH (ref 70–99)

## 2018-07-18 LAB — BASIC METABOLIC PANEL
Anion gap: 7 (ref 5–15)
BUN: 11 mg/dL (ref 6–20)
CO2: 24 mmol/L (ref 22–32)
Calcium: 8.7 mg/dL — ABNORMAL LOW (ref 8.9–10.3)
Chloride: 105 mmol/L (ref 98–111)
Creatinine, Ser: 0.75 mg/dL (ref 0.61–1.24)
GFR calc Af Amer: >60 mL/min (ref >60)
GFR calc non Af Amer: >60 mL/min (ref >60)
Glucose, Bld: 302 mg/dL — ABNORMAL HIGH (ref 70–99)
Potassium: 3.9 mmol/L (ref 3.5–5.1)
Sodium: 136 mmol/L (ref 135–145)

## 2018-07-18 LAB — MAGNESIUM: Magnesium: 1.9 mg/dL (ref 1.7–2.4)

## 2018-07-18 MED ORDER — METFORMIN HCL 1000 MG PO TABS: 1000.0000 mg | ORAL_TABLET | Freq: Two times a day (BID) | ORAL | 1 refills | Status: DC

## 2018-07-18 MED ORDER — ESCITALOPRAM OXALATE 10 MG PO TABS: 10.0000 mg | ORAL_TABLET | Freq: Every day | ORAL | 1 refills | Status: DC

## 2018-07-18 MED ORDER — LINAGLIPTIN 5 MG PO TABS: 5.0000 mg | ORAL_TABLET | Freq: Every day | ORAL | 1 refills | Status: DC

## 2018-07-18 MED ORDER — INSULIN ASPART 100 UNIT/ML ~~LOC~~ SOLN: 8.0000 [IU] | Freq: Three times a day (TID) | SUBCUTANEOUS | 1 refills | Status: DC

## 2018-07-18 MED ORDER — BUSPIRONE HCL 15 MG PO TABS: 15.0000 mg | ORAL_TABLET | Freq: Three times a day (TID) | ORAL | 1 refills | Status: DC

## 2018-07-18 MED ORDER — TRAZODONE HCL 100 MG PO TABS: 100.0000 mg | ORAL_TABLET | Freq: Every evening | ORAL | 1 refills | Status: DC | PRN

## 2018-07-18 MED ORDER — METFORMIN HCL 500 MG PO TABS
500.0000 mg | ORAL_TABLET | Freq: Two times a day (BID) | ORAL | Status: DC
  Administered 2018-07-18: 500 mg via ORAL
  Filled 2018-07-18: qty 1

## 2018-07-18 MED ORDER — ENSURE MAX PROTEIN PO LIQD
11.0000 [oz_av] | Freq: Two times a day (BID) | ORAL | Status: DC
  Administered 2018-07-18: 11 [oz_av] via ORAL
  Filled 2018-07-18: qty 330

## 2018-07-18 MED ORDER — INSULIN GLARGINE 100 UNIT/ML ~~LOC~~ SOLN: 40.0000 [IU] | Freq: Every day | SUBCUTANEOUS | 1 refills | Status: DC

## 2018-07-18 MED ORDER — METFORMIN HCL 500 MG PO TABS
1000.0000 mg | ORAL_TABLET | Freq: Two times a day (BID) | ORAL | Status: DC
  Administered 2018-07-18 – 2018-07-19 (×2): 1000 mg via ORAL
  Filled 2018-07-18 (×2): qty 2

## 2018-07-18 MED ORDER — LINAGLIPTIN 5 MG PO TABS
5.0000 mg | ORAL_TABLET | Freq: Every day | ORAL | Status: DC
  Administered 2018-07-18 – 2018-07-19 (×2): 5 mg via ORAL
  Filled 2018-07-18 (×2): qty 1

## 2018-07-18 MED ORDER — INSULIN GLARGINE 100 UNIT/ML ~~LOC~~ SOLN
40.0000 [IU] | Freq: Every day | SUBCUTANEOUS | Status: DC
  Filled 2018-07-18: qty 0.4

## 2018-07-18 MED ORDER — ADULT MULTIVITAMIN W/MINERALS CH
1.0000 | ORAL_TABLET | Freq: Every day | ORAL | Status: DC
  Administered 2018-07-18 – 2018-07-19 (×2): 1 via ORAL
  Filled 2018-07-18 (×2): qty 1

## 2018-07-18 MED ORDER — INSULIN ASPART 100 UNIT/ML ~~LOC~~ SOLN: 0.0000 [IU] | Freq: Three times a day (TID) | SUBCUTANEOUS | 1 refills | Status: DC

## 2018-07-18 NOTE — Plan of Care (Signed)
Patient was seen having sugar in his coffee. Patient given education but refused to to stop drinking the sugar in his coffee.   Problem: Health Behavior/Discharge Planning: Goal: Compliance with treatment plan for underlying cause of condition will improve  07/18/2018 0101 by Elmyra Ricks, RN Outcome: Not Progressing

## 2018-07-18 NOTE — [facility] Group Notes (Signed)
Feelings Around Diagnosis 07/18/2018 1PM  Type of Therapy/Topic:  Group Therapy:  Feelings about Diagnosis  Participation Level:  Did Not Attend   Description of Group:   This group will allow patients to explore their thoughts and feelings about diagnoses they have received. Patients will be guided to explore their level of understanding and acceptance of these diagnoses. Facilitator will encourage patients to process their thoughts and feelings about the reactions of others to their diagnosis and will guide patients in identifying ways to discuss their diagnosis with significant others in their lives. This group will be process-oriented, with patients participating in exploration of their own experiences, giving and receiving support, and processing challenge from other group members.   Therapeutic Goals: 1. Patient will demonstrate understanding of diagnosis as evidenced by identifying two or more symptoms of the disorder 2. Patient will be able to express two feelings regarding the diagnosis 3. Patient will demonstrate their ability to communicate their needs through discussion and/or role play  Summary of Patient Progress:       Therapeutic Modalities:   Cognitive Behavioral Therapy Brief Therapy Feelings Identification    Akaya Proffit T Quint Chestnut, LCSW 07/18/2018 2:02 PM  

## 2018-07-18 NOTE — Progress Notes (Signed)
[facility] [place] [facility]   PATIENT NAME: Daniel [name]    MR#:  [id number]  DATE OF BIRTH:  [birth year]  SUBJECTIVE:  CHIEF COMPLAINT: Medical service consulted for management of high blood sugars  No new complaints overnight .  Blood sugars noted to be uncontrolled. Importance of compliance with diabetic diet reemphasized this morning.  REVIEW OF SYSTEMS:  Review of Systems  Constitutional: Negative for chills and fever.  HENT: Negative for hearing loss and tinnitus.   Eyes: Negative for blurred vision and double vision.  Respiratory: Negative for cough, hemoptysis and shortness of breath.   Cardiovascular: Negative for chest pain and palpitations.  Gastrointestinal: Negative for heartburn, nausea and vomiting.  Genitourinary: Negative for dysuria and urgency.  Musculoskeletal: Negative for myalgias and neck pain.  Skin: Negative for itching and rash.  Neurological: Negative for dizziness and headaches.  Psychiatric/Behavioral: Negative for depression and hallucinations.    DRUG ALLERGIES:   Allergies  Allergen Reactions  . Hydrocodone-Acetaminophen Hives    Vicodin/Norco  . Suboxone [Buprenorphine Hcl-Naloxone Hcl] Other (See Comments)    Urinary retention   VITALS:  Blood pressure 133/86, pulse (!) 106, temperature 98.6 F (37 C), temperature source Oral, resp. rate 16, height 6' (1.829 m), weight 113.4 kg, SpO2 97 %. PHYSICAL EXAMINATION:   Physical Exam  Constitutional: He is oriented to person, place, and time. He appears well-developed.  HENT:  Head: Normocephalic and atraumatic.  Right Ear: External ear normal.  Eyes: Pupils are equal, round, and reactive to light. Conjunctivae are normal. Right eye exhibits no discharge.  Neck: Normal range of motion. Neck supple. No thyromegaly present.  Cardiovascular: Normal rate, regular rhythm and normal heart sounds.  Respiratory: Effort normal. No respiratory distress.  GI: Soft. Bowel  sounds are normal. There is no abdominal tenderness.  Musculoskeletal: Normal range of motion.        General: No edema.  Neurological: He is alert and oriented to person, place, and time. No cranial nerve deficit.  Skin: Skin is warm. He is not diaphoretic. No erythema.  Psychiatric: He has a normal mood and affect. His behavior is normal.   LABORATORY PANEL:  Male CBC Recent Labs  Lab 07/16/18 0632  WBC 8.2  HGB 15.1  HCT 43.2  PLT 211   ------------------------------------------------------------------------------------------------------------------ Chemistries  Recent Labs  Lab 07/14/18 0328  07/18/18 0654  NA 139   < > 136  K 3.3*   < > 3.9  CL 105   < > 105  CO2 23   < > 24  GLUCOSE 343*   < > 302*  BUN 6   < > 11  CREATININE 0.70   < > 0.75  CALCIUM 9.0   < > 8.7*  MG 1.9   < > 1.9  AST 39  --   --   ALT 73*  --   --   ALKPHOS 98  --   --   BILITOT 0.5  --   --    < > = values in this interval not displayed.   RADIOLOGY:  No results found. ASSESSMENT AND PLAN:   1.  Newly diagnosed diabetes mellitus with acute hyperglycemia With history of polyuria, intermittent blurry vision, nocturia.  Glycosylated hemoglobin level of 10.4. Blood sugars still uncontrolled and occasionally up to 300 secondary to noncompliance with diabetic diet. Education done on importance of compliance with diabetic diet..  Patient currently on Lantus insulin 30 units daily and NovoLog insulin pre-meals;  8 units 3 times daily.  Also on sliding scale insulin coverage. Patient not very compliant with diet.  Concerned that patient might not be very compliant with pre-meal insulin per my discussion with diabetic nurse coordinator. Metformin and Tradjenta was added today to hopefully decrease insulin requirement prior to discharge from the hospital. Monitor blood sugars and adjust regimen as needed.  2. Hypokalemia; replaced.   3. History of hypertension Blood pressure controlled  4.  Acute dual diagnosis with depression and substance abuse Management per primary service  5. History of urinary retention Stable Conservative medical management  DVT prophylaxis; patient is ambulatory on the psych floor.   All the records are reviewed and case discussed with Care Management/Social Worker. Management plans discussed with the patient, family and they are in agreement.  CODE STATUS: Full Code  TOTAL TIME TAKING CARE OF THIS PATIENT: 35 minutes.   More than 50% of the time was spent in counseling/coordination of care: YES  POSSIBLE D/C IN 2 DAYS, DEPENDING ON CLINICAL CONDITION.   Tom Ragsdale M.D on 07/18/2018 at 2:24 PM  Between 7am to 6pm - Pager - [phone]  After 6pm go to [email or web address] - Social research officer, governmentpassword [email or web address] [email or web address]  [facility]  Office  [phone]  CC: Primary care physician; System, Pcp Not In  Note: This dictation was prepared with Dragon dictation along with smaller phrase technology. Any transcriptional errors that result from this process are unintentional.

## 2018-07-18 NOTE — Progress Notes (Signed)
D - Patient was in his room upon arrival to the unit. Patient was pleasant during assessment and medication administration. Patient denies SI/HI/AVH, pain and anxiety. Patient endorses depression rating it 5/10. Patient stated, "I am tired of having to get my blood sugar checked and getting insulin." Patient given education.   A - Patient was compliant with medication administration and procedures on the unit. Patient was observed interacting appropriately with staff and peers on the unit. Patient given education. Patient given support and encouragement to be active with his treatment plan. Patient informed to let staff know if there are any issues or problems on the unit.   R - Patient being monitored Q 15 minutes for safety per unit protocol. Patient remains safe on the unit.

## 2018-07-18 NOTE — Progress Notes (Signed)
Inpatient Diabetes Program Recommendations  AACE/ADA: New Consensus Statement on Inpatient Glycemic Control  Target Ranges:  Prepandial:   less than 140 mg/dL      Peak postprandial:   less than 180 mg/dL (1-2 hours)      Critically ill patients:  140 - 180 mg/dL   Results for Daniel [name], Daniel [name] (MRN [id number]) as of 07/18/2018 13:45  Ref. Range 07/17/2018 06:58 07/17/2018 11:09 07/17/2018 16:10 07/17/2018 21:24 07/18/2018 06:52 07/18/2018 11:33  Glucose-Capillary Latest Ref Range: 70 - 99 mg/dL 027 (H) 253 (H) 664 (H) 300 (H) 282 (H) 253 (H)   Review of Glycemic Control  Diabetes history: No Outpatient Diabetes medications: NA Current orders for Inpatient glycemic control: Lantus 30 units daily, Novolog 8 units TID with meals, Novolog 0-20 units TID with meals, Novolog 0-5 units QHS, Metformin 500 mg BID, Tradjenta 5 mg daily  Inpatient Diabetes Program Recommendations:   Oral Agents:  Noted Metformin 500 mg BID and Tradjenta 5 mg daily ordered today.  HgbA1C: A1C 10.4% on 07/14/18 indicating an average glucose of 252 mg/dl over the past 2-3 months.  NOTE: Spoke with patient again today regarding new DM dx. Reviewed information discussed yesterday and patient was able to answer questions appropriately. Patient states that he has been reading over the written information he was given yesterday regarding DM. Reviewed Carb Modified diet again and informed patient that RD would likely talk with him on Thursday. Had patient demonstrate how to draw up insulin with vial/syringe and he was able to successfully demonstrate proper technique to draw up insulin with vial/syringe. Patient states that he has given himself some insulin injections and he feels comfortable with self-injecting insulin. Discussed glucose trends and current orders for DM control with Lantus, Novolog, Metformin, and Tradjenta. Discussed Metformin and Tradjenta and how they work to improve glycemic control.  Patient verbalized understanding  of information discussed and he reports that he has no questions or concerns regarding DM at this time.  Thanks, Orlando Penner, RN, MSN, CDE Diabetes Coordinator Inpatient Diabetes Program [phone] (Team Pager from 8am to 5pm)

## 2018-07-18 NOTE — Plan of Care (Signed)
Pt states he slept "fair" Pt rates depression 5/10 and 7/10. Pt denies SI, HI and AVH. Pt has a goal "to get out of here". Pt was educated on care plan and verbalizes understanding. Torrie Mayers RN Problem: Education: Goal: Charity fundraiser Education information/materials will improve Outcome: Progressing   Problem: Health Behavior/Discharge Planning: Goal: Identification of resources available to assist in meeting health care needs will improve Outcome: Progressing Goal: Compliance with treatment plan for underlying cause of condition will improve Outcome: Progressing   Problem: Coping: Goal: Coping ability will improve Outcome: Progressing Goal: Will verbalize feelings Outcome: Progressing   Problem: Role Relationship: Goal: Will demonstrate positive changes in social behaviors and relationships Outcome: Progressing

## 2018-07-18 NOTE — Progress Notes (Signed)
[facility] MD Progress Note  07/18/2018 3:55 PM Daniel [name]  MRN:  [id number] Subjective: Follow-up for this patient with chronic mood problems and substance abuse.  Today on interview the patient tells me with a smile that he is feeling just fine.  Complete turnaround from yesterday.  Denies any suicidal thoughts whatsoever.  States he feels confident going back home and dealing with his brother.  Patient's blood sugars continue to run quite high well up around 300 much of the time.  This despite insulin usage.  Patient however is requesting discharge back home.  He is, as he is often typical for him, non-cooperative with therapy groups.  He has spoken to the representative from [facility].  No medication problems.  No evidence of psychosis Principal Problem: MDD (major depressive disorder), severe (HCC) Diagnosis: Principal Problem:   MDD (major depressive disorder), severe (HCC) Active Problems:   Cocaine abuse (HCC)  Total Time spent with patient: 30 minutes  Past Psychiatric History: Patient has a longstanding history of substance abuse and mood instability with behavior problems often related to drug use  Past Medical History:  Past Medical History:  Diagnosis Date  . Allergy   . Anxiety   . Back pain   . Chronic pain    2/2 accident/trauma  . Hypertension   . Opioid abuse (HCC)   . Urinary retention     Past Surgical History:  Procedure Laterality Date  . BACK SURGERY    . KNEE SURGERY Left    Family History:  Family History  Problem Relation Age of Onset  . Alcohol abuse Father    Family Psychiatric  History: Has a brother who reportedly is also a heavy substance abuser Social History:  Social History   Substance and Sexual Activity  Alcohol Use No     Social History   Substance and Sexual Activity  Drug Use No   Comment: Former use of cocaine and opioids    Social History   Socioeconomic History  . Marital status: Divorced    Spouse name: Not on file  . Number of  children: Not on file  . Years of education: Not on file  . Highest education level: Not on file  Occupational History  . Not on file  Social Needs  . Financial resource strain: Not on file  . Food insecurity:    Worry: Not on file    Inability: Not on file  . Transportation needs:    Medical: Not on file    Non-medical: Not on file  Tobacco Use  . Smoking status: Current Every Day Smoker    Packs/day: 1.00    Types: E-cigarettes, Cigarettes    Last attempt to quit: 03/16/2015    Years since quitting: 3.3  . Smokeless tobacco: Never Used  Substance and Sexual Activity  . Alcohol use: No  . Drug use: No    Comment: Former use of cocaine and opioids  . Sexual activity: Not on file  Lifestyle  . Physical activity:    Days per week: Not on file    Minutes per session: Not on file  . Stress: Not on file  Relationships  . Social connections:    Talks on phone: Not on file    Gets together: Not on file    Attends religious service: Not on file    Active member of club or organization: Not on file    Attends meetings of clubs or organizations: Not on file    Relationship  status: Not on file  Other Topics Concern  . Not on file  Social History Narrative  . Not on file   Additional Social History:                         Sleep: Fair  Appetite:  Fair  Current Medications: Current Facility-Administered Medications  Medication Dose Route Frequency Provider Last Rate Last Dose  . acetaminophen (TYLENOL) tablet 1,000 mg  1,000 mg Oral BID PRN He, Jun, MD   1,000 mg at 07/15/18 1142  . alum & mag hydroxide-simeth (MAALOX/MYLANTA) 200-200-20 MG/5ML suspension 30 mL  30 mL Oral Q4H PRN Catalina Gravel, NP      . busPIRone (BUSPAR) tablet 15 mg  15 mg Oral TID He, Jun, MD   15 mg at 07/18/18 1125  . diphenhydrAMINE (BENADRYL) capsule 25 mg  25 mg Oral Q6H PRN Catalina Gravel, NP      . escitalopram (LEXAPRO) tablet 10 mg  10 mg Oral Daily He, Jun, MD   10 mg  at 07/18/18 0840  . ibuprofen (ADVIL) tablet 400 mg  400 mg Oral Q6H PRN Donata Clay R, DO   400 mg at 07/16/18 1609  . insulin aspart (novoLOG) injection 0-20 Units  0-20 Units Subcutaneous TID WC Salary, Montell D, MD   11 Units at 07/18/18 1135  . insulin aspart (novoLOG) injection 0-5 Units  0-5 Units Subcutaneous QHS Angelina Ok D, MD   3 Units at 07/17/18 2126  . insulin aspart (novoLOG) injection 8 Units  8 Units Subcutaneous TID WC Enid Baas, Jude, MD   8 Units at 07/18/18 1136  . [START ON 07/19/2018] insulin glargine (LANTUS) injection 40 Units  40 Units Subcutaneous Daily Armando Bukhari T, MD      . linagliptin (TRADJENTA) tablet 5 mg  5 mg Oral Daily Ojie, Jude, MD   5 mg at 07/18/18 1121  . magnesium hydroxide (MILK OF MAGNESIA) suspension 30 mL  30 mL Oral Daily PRN Catalina Gravel, NP      . metFORMIN (GLUCOPHAGE) tablet 1,000 mg  1,000 mg Oral BID WC Ivelisse Culverhouse T, MD      . multivitamin with minerals tablet 1 tablet  1 tablet Oral Daily Jannice Beitzel T, MD   1 tablet at 07/18/18 1123  . nicotine (NICODERM CQ - dosed in mg/24 hours) patch 21 mg  21 mg Transdermal Daily Neesha Langton, Jackquline Denmark, MD   21 mg at 07/18/18 0841  . protein supplement (ENSURE MAX) liquid  11 oz Oral BID Zyon Grout, Jackquline Denmark, MD   11 oz at 07/18/18 1138  . traZODone (DESYREL) tablet 100 mg  100 mg Oral QHS PRN He, Jun, MD   100 mg at 07/17/18 2129    Lab Results:  Results for orders placed or performed during the hospital encounter of 07/14/18 (from the past 48 hour(s))  Glucose, capillary     Status: Abnormal   Collection Time: 07/16/18  4:13 PM  Result Value Ref Range   Glucose-Capillary 286 (H) 70 - 99 mg/dL  Glucose, capillary     Status: Abnormal   Collection Time: 07/16/18  9:04 PM  Result Value Ref Range   Glucose-Capillary 256 (H) 70 - 99 mg/dL  Glucose, capillary     Status: Abnormal   Collection Time: 07/17/18  6:58 AM  Result Value Ref Range   Glucose-Capillary 334 (H) 70 - 99 mg/dL  Glucose,  capillary     Status: Abnormal  Collection Time: 07/17/18 11:09 AM  Result Value Ref Range   Glucose-Capillary 246 (H) 70 - 99 mg/dL   Comment 1 Notify RN   Glucose, capillary     Status: Abnormal   Collection Time: 07/17/18  4:10 PM  Result Value Ref Range   Glucose-Capillary 353 (H) 70 - 99 mg/dL  Glucose, capillary     Status: Abnormal   Collection Time: 07/17/18  9:24 PM  Result Value Ref Range   Glucose-Capillary 300 (H) 70 - 99 mg/dL  Glucose, capillary     Status: Abnormal   Collection Time: 07/18/18  6:52 AM  Result Value Ref Range   Glucose-Capillary 282 (H) 70 - 99 mg/dL   Comment 1 Notify RN   Basic metabolic panel     Status: Abnormal   Collection Time: 07/18/18  6:54 AM  Result Value Ref Range   Sodium 136 135 - 145 mmol/L   Potassium 3.9 3.5 - 5.1 mmol/L   Chloride 105 98 - 111 mmol/L   CO2 24 22 - 32 mmol/L   Glucose, Bld 302 (H) 70 - 99 mg/dL   BUN 11 6 - 20 mg/dL   Creatinine, Ser 1.610.75 0.61 - 1.24 mg/dL   Calcium 8.7 (L) 8.9 - 10.3 mg/dL   GFR calc non Af Amer >60 >60 mL/min   GFR calc Af Amer >60 >60 mL/min   Anion gap 7 5 - 15    Comment: Performed at [facility], [address] [place], [place] [address]  Magnesium     Status: None   Collection Time: 07/18/18  6:54 AM  Result Value Ref Range   Magnesium 1.9 1.7 - 2.4 mg/dL    Comment: Performed at [facility], [address] [place], [place] [address]  Glucose, capillary     Status: Abnormal   Collection Time: 07/18/18 11:33 AM  Result Value Ref Range   Glucose-Capillary 253 (H) 70 - 99 mg/dL    Blood Alcohol level:  Lab Results  Component Value Date   ETH <10 07/14/2018   ETH <5 04/15/2016    Metabolic Disorder Labs: Lab Results  Component Value Date   HGBA1C 10.4 (H) 07/14/2018   MPG 251.78 07/14/2018   No results found for: PROLACTIN Lab Results  Component Value Date   CHOL 140 02/24/2015   TRIG 89 02/24/2015   HDL 32 (L) 02/24/2015   CHOLHDL 4.4 02/24/2015    VLDL 18 02/24/2015   LDLCALC 90 02/24/2015   LDLCALC 44 11/29/2011    Physical Findings: AIMS:  , ,  ,  ,    CIWA:  CIWA-Ar Total: 0 COWS:     Musculoskeletal: Strength & Muscle Tone: within normal limits Gait & Station: normal Patient leans: N/A  Psychiatric Specialty Exam: Physical Exam  Nursing note and vitals reviewed. Constitutional: He appears well-developed and well-nourished.  HENT:  Head: Normocephalic and atraumatic.  Eyes: Pupils are equal, round, and reactive to light. Conjunctivae are normal.  Neck: Normal range of motion.  Cardiovascular: Regular rhythm and normal heart sounds.  Respiratory: Effort normal. No respiratory distress.  GI: Soft.  Musculoskeletal: Normal range of motion.  Neurological: He is alert.  Skin: Skin is warm and dry.  Psychiatric: He has a normal mood and affect. His speech is normal and behavior is normal. Thought content normal. Cognition and memory are normal. He expresses impulsivity.    Review of Systems  Constitutional: Negative.   HENT: Negative.   Eyes: Negative.   Respiratory:  Negative.   Cardiovascular: Negative.   Gastrointestinal: Negative.   Musculoskeletal: Negative.   Skin: Negative.   Neurological: Negative.   Psychiatric/Behavioral: Negative for depression, hallucinations, memory loss, substance abuse and suicidal ideas. The patient is not nervous/anxious and does not have insomnia.     Blood pressure 133/86, pulse (!) 106, temperature 98.6 F (37 C), temperature source Oral, resp. rate 16, height 6' (1.829 m), weight 113.4 kg, SpO2 97 %.Body mass index is 33.91 kg/m.  General Appearance: Casual  Eye Contact:  Good  Speech:  Clear and Coherent  Volume:  Decreased  Mood:  Euthymic  Affect:  Congruent  Thought Process:  Goal Directed  Orientation:  Full (Time, Place, and Person)  Thought Content:  Logical  Suicidal Thoughts:  No  Homicidal Thoughts:  No  Memory:  Immediate;   Fair Recent;   Fair Remote;    Fair  Judgement:  Fair  Insight:  Fair  Psychomotor Activity:  Decreased  Concentration:  Concentration: Fair  Recall:  [employer] of Knowledge:  Fair  Language:  Fair  Akathisia:  No  Handed:  Right  AIMS (if indicated):     Assets:  Desire for Improvement Housing Resilience  ADL's:  Intact  Cognition:  WNL  Sleep:  Number of Hours: 5.25     Treatment Plan Summary: Daily contact with patient to assess and evaluate symptoms and progress in treatment, Medication management and Plan Patient is reporting a good mood.  Denies suicidal ideation.  This is probably pretty much his baseline.  I think he probably consciously changes his story depending on what his motivations are and now he is wanting to get out of the hospital.  Sadly he is at high risk of returning to substance abuse problems although [facility] as always is continuing to offer services.  My bigger concern for his health right now is his blood sugars.  I have increased his daily insulin doses and metformin doses and we will get to observe how he is doing at least overnight with that.  He has received diabetic education and I am hoping that he manages to take care of this after he is discharged  Mordecai Rasmussen, MD 07/18/2018, 3:55 PM

## 2018-07-18 NOTE — Progress Notes (Signed)
Recreation Therapy Notes   Date: 07/18/2018  Time: 9:30 am  Location: Craft room  Behavioral response: Appropriate   Intervention Topic: Self-esteem  Discussion/Intervention:  Group content today was focused on self-esteem. Patient defined self-esteem and where it comes form. The group described reasons self-esteem is important. Individuals stated things that impact self-esteem and positive ways to improve self-esteem. The group participated in the intervention "Collage of Me" where patients were able to create a collage of positive things that makes them who they are.  Clinical Observations/Feedback:  Patient came to group late and explained that self-esteem is important because it can affect you. Individual was social with peers and staff while participating in the intervention. Cristan Hout LRT/CTRS         Mamie Diiorio 07/18/2018 11:31 AM

## 2018-07-18 NOTE — Progress Notes (Signed)
Nutrition Brief Note  RD received consult to provide diabetic diet education to patient. Due to COVID 19 pandemic, nutrition department currently working remotely. RD will be back on campus on Thursday 5/21; will plan to provide diabetes education to patient at that time.   Betsey Holiday MS, RD, LDN Pager #- [phone] Office#- [phone] After Hours Pager: [phone]

## 2018-07-19 LAB — GLUCOSE, CAPILLARY
Glucose-Capillary: 314 mg/dL — ABNORMAL HIGH (ref 70–99)
Glucose-Capillary: 249 mg/dL — ABNORMAL HIGH (ref 70–99)

## 2018-07-19 MED ORDER — BLOOD GLUCOSE MONITOR KIT: PACK | 0 refills | Status: DC

## 2018-07-19 MED ORDER — INSULIN ASPART PROT & ASPART (70-30 MIX) 100 UNIT/ML ~~LOC~~ SUSP: 35.0000 [IU] | Freq: Two times a day (BID) | SUBCUTANEOUS | 2 refills | Status: DC

## 2018-07-19 MED ORDER — INSULIN GLARGINE 100 UNIT/ML ~~LOC~~ SOLN
45.0000 [IU] | Freq: Every day | SUBCUTANEOUS | Status: DC
  Filled 2018-07-19: qty 0.45

## 2018-07-19 MED ORDER — INSULIN ASPART PROT & ASPART (70-30 MIX) 100 UNIT/ML ~~LOC~~ SUSP
35.0000 [IU] | Freq: Two times a day (BID) | SUBCUTANEOUS | Status: DC
  Administered 2018-07-19: 35 [IU] via SUBCUTANEOUS
  Filled 2018-07-19: qty 10

## 2018-07-19 NOTE — Progress Notes (Addendum)
Pt denied SI, HI and AVH. Pt received transition record, AVS, suicide risk assessment, prescriptions, belongings and discharge instructions. Pt was educated and  verbalized understanding. Pt was dc out the front door of the ED to his parked car. Torrie Mayers RN

## 2018-07-19 NOTE — Plan of Care (Signed)
Pt says he "slept good". Pt rates depression 5/10 and anxiety 8/10. Pt denies SI, HI and AVH. Pt was educated on care plan and verbalizes understanding. Torrie Mayers RN Problem: Education: Goal: Charity fundraiser Education information/materials will improve Outcome: Progressing   Problem: Health Behavior/Discharge Planning: Goal: Identification of resources available to assist in meeting health care needs will improve Outcome: Progressing Goal: Compliance with treatment plan for underlying cause of condition will improve Outcome: Progressing   Problem: Coping: Goal: Coping ability will improve Outcome: Progressing Goal: Will verbalize feelings Outcome: Progressing   Problem: Role Relationship: Goal: Will demonstrate positive changes in social behaviors and relationships Outcome: Progressing

## 2018-07-19 NOTE — Progress Notes (Signed)
Inpatient Diabetes Program Recommendations  AACE/ADA: New Consensus Statement on Inpatient Glycemic Control   Target Ranges:  Prepandial:   less than 140 mg/dL      Peak postprandial:   less than 180 mg/dL (1-2 hours)      Critically ill patients:  140 - 180 mg/dL   Results for DAQUANTE, CALLIS (MRN [id number]) as of 07/19/2018 08:16  Ref. Range 07/18/2018 06:52 07/18/2018 11:33 07/18/2018 16:19 07/18/2018 20:59 07/19/2018 06:59  Glucose-Capillary Latest Ref Range: 70 - 99 mg/dL 320 (H) 233 (H) 435 (H) 255 (H) 314 (H)   Review of Glycemic Control  Current orders for Inpatient glycemic control: 70/30 35 units BID, Novolog 0-20 units TID with meals, Novolog 0-5 units QHS, Tradjenta 5 mg daily, Metformin 500 mg BID  Inpatient Diabetes Program Recommendations:   Insulin-  After discussion with Dr. Nemiah Commander this morning, it was decided to discontinue Lantus and Novolog meal coverage (was ordered Lantus 45 units daily and Novolog 8 units TID with meals) and instead to use 70/30 35 units BID (dose will provide a total of 49 units for basal and 21 units for meal coverage per day).  This will allow patient to only have to take 2 insulin injections per day (one with breakfast and one with supper) at home.  NOTE: Due to concern for compliance as an outpatient with taking 4 insulin injections per day, basal insulin and meal coverage insulin were discontinued and 70/30 insulin ordered instead which will provide basal and meal coverage insulin and be given in 2 injections per day.  Sent chat to [facility], RN regarding changes made this morning so that Lantus and Novolog meal coverage would not be given this morning and to ask that 70/30 35 units be given as soon as it was received.   Thanks, Orlando Penner, RN, MSN, CDE Diabetes Coordinator Inpatient Diabetes Program [phone] (Team Pager from 8am to 5pm)

## 2018-07-19 NOTE — [facility] Group Notes (Signed)
LCSW Group Therapy Note  07/19/2018 1:00 PM  Type of Therapy/Topic:  Group Therapy:  Emotion Regulation  Participation Level:  Did Not Attend   Description of Group:   The purpose of this group is to assist patients in learning to regulate negative emotions and experience positive emotions. Patients will be guided to discuss ways in which they have been vulnerable to their negative emotions. These vulnerabilities will be juxtaposed with experiences of positive emotions or situations, and patients will be challenged to use positive emotions to combat negative ones. Special emphasis will be placed on coping with negative emotions in conflict situations, and patients will process healthy conflict resolution skills.  Therapeutic Goals: 1. Patient will identify two positive emotions or experiences to reflect on in order to balance out negative emotions 2. Patient will label two or more emotions that they find the most difficult to experience 3. Patient will demonstrate positive conflict resolution skills through discussion and/or role plays  Summary of Patient Progress: X  Therapeutic Modalities:   Cognitive Behavioral Therapy Feelings Identification Dialectical Behavioral Therapy  Penni Homans, MSW, LCSW 07/19/2018 12:47 PM

## 2018-07-19 NOTE — Progress Notes (Signed)
Recreation Therapy Notes   Date: 07/19/2018  Time: 9:30 am   Location: Craft room   Behavioral response: N/A   Intervention Topic: Stress  Discussion/Intervention: Patient did not attend group.   Clinical Observations/Feedback:  Patient did not attend group.   Judine Arciniega LRT/CTRS        Sallyann Kinnaird 07/19/2018 10:49 AM 

## 2018-07-19 NOTE — Progress Notes (Signed)
Recreation Therapy Notes  INPATIENT RECREATION TR PLAN  Patient Details Name: Daniel [name] MRN: [id number] DOB: [birth year] Today's Date: 07/19/2018  Rec Therapy Plan Is patient appropriate for Therapeutic Recreation?: Yes Treatment times per week: at least 3 Estimated Length of Stay: 5-7 days TR Treatment/Interventions: Group participation (Comment)  Discharge Criteria Pt will be discharged from therapy if:: Discharged Treatment plan/goals/alternatives discussed and agreed upon by:: Patient/family  Discharge Summary Short term goals set: Patient will engage in groups without prompting or encouragement from LRT x3 group sessions within 5 recreation therapy group sessions Short term goals met: Not met Progress toward goals comments: Groups attended Which groups?: Self-esteem Reason goals not met: Patient spent most of his time in his room Therapeutic equipment acquired: N/A Reason patient discharged from therapy: Discharge from hospital Pt/family agrees with progress & goals achieved: Yes Date patient discharged from therapy: 07/19/18   Huldah Marin 07/19/2018, 11:31 AM

## 2018-07-19 NOTE — Progress Notes (Signed)
Patient alert and oriented x 4 , affect is flat but he brightens upon approach, patient expressed he is anxious about getting discharged because his afraid of relapsing. Patient rated his depression a 5/10 ( low 0 - 10 high ) he was noted interacting appropriately with peers and staff, compliant with medication regimen, 1 he was offered emotional support and encouragement. 15 minutes safety checks maintained will continue to monitor
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
## 2018-07-19 NOTE — Plan of Care (Signed)
  Problem: Coping: Goal: Will verbalize feelings Outcome: Progressing  Patient verbalized feelings about getting discharge and  potential of been around a brother that uses drugs and that it could make him relapse again.

## 2018-07-19 NOTE — Progress Notes (Signed)
  [facility] Adult Case Management Discharge Plan :  Will you be returning to the same living situation after discharge:  Yes,  lives with relatives At discharge, do you have transportation home?: Yes,  pt's car is on campus Do you have the ability to pay for your medications: Yes,  insurance  Release of information consent forms completed and in the chart;  Patient's signature needed at discharge.  Patient to Follow up at: Follow-up Information    Center, [facility] Follow up on 07/26/2018.   Why:  You have a medication management appointment scheduled for Wednesday 07/26/2018 at 11:00AM with Dr. Ephraim Hamburger. Thank You! Contact information: [address] [place] [address] [phone]           Next level of care provider has access to [facility] Link:no  Safety Planning and Suicide Prevention discussed: Yes,  with pt; pt declined family contact  Have you used any form of tobacco in the last 30 days? (Cigarettes, Smokeless Tobacco, Cigars, and/or Pipes): Yes  Has patient been referred to the Quitline?: N/A patient is not a smoker  Patient has been referred for addiction treatment: N/A  Suzan Slick, LCSW 07/19/2018, 10:42 AM

## 2018-07-19 NOTE — BHH Suicide Risk Assessment (Signed)
[facility] Discharge Suicide Risk Assessment   Principal Problem: MDD (major depressive disorder), severe ([facility]) Discharge Diagnoses: Principal Problem:   MDD (major depressive disorder), severe ([facility]) Active Problems:   Cocaine abuse ([facility])   Total Time spent with patient: 45 minutes  Musculoskeletal: Strength & Muscle Tone: within normal limits Gait & Station: normal Patient leans: N/A  Psychiatric Specialty Exam: Review of Systems  Constitutional: Negative.   HENT: Negative.   Eyes: Negative.   Respiratory: Negative.   Cardiovascular: Negative.   Gastrointestinal: Negative.   Musculoskeletal: Negative.   Skin: Negative.   Neurological: Negative.   Psychiatric/Behavioral: Negative for depression, hallucinations, memory loss, substance abuse and suicidal ideas. The patient is not nervous/anxious and does not have insomnia.     Blood pressure (!) 128/95, pulse (!) 105, temperature 97.9 F (36.6 C), temperature source Oral, resp. rate 18, height 6' (1.829 m), weight 113.4 kg, SpO2 98 %.Body mass index is 33.91 kg/m.  General Appearance: Casual  Eye Contact::  Fair  Speech:  Clear and Coherent409  Volume:  Normal  Mood:  Dysphoric  Affect:  Constricted  Thought Process:  Coherent  Orientation:  Full (Time, Place, and Person)  Thought Content:  Logical  Suicidal Thoughts:  No  Homicidal Thoughts:  No  Memory:  Immediate;   Fair Recent;   Fair Remote;   Fair  Judgement:  Fair  Insight:  Fair  Psychomotor Activity:  Normal  Concentration:  Fair  Recall:  [employer] of Knowledge:Fair  Language: Fair  Akathisia:  No  Handed:  Right  AIMS (if indicated):     Assets:  Desire for Improvement Housing Resilience  Sleep:  Number of Hours: 7  Cognition: WNL  ADL's:  Intact   Mental Status Per Nursing Assessment::   On Admission:  NA  Demographic Factors:  Male, Divorced or widowed, Caucasian, Living alone and Unemployed  Loss Factors: Decline in physical  health  Historical Factors: Impulsivity  Risk Reduction Factors:   Positive therapeutic relationship  Continued Clinical Symptoms:  Depression:   Impulsivity Alcohol/Substance Abuse/Dependencies  Cognitive Features That Contribute To Risk:  None    Suicide Risk:  Minimal: No identifiable suicidal ideation.  Patients presenting with no risk factors but with morbid ruminations; may be classified as minimal risk based on the severity of the depressive symptoms  Follow-up Information    Center, [facility] Follow up on 07/26/2018.   Why:  You have a medication management appointment scheduled for Wednesday 07/26/2018 at 11:00AM with Dr. Ephraim Hamburger. Thank You! Contact information: [address] [place] [address] [phone]           Plan Of Care/Follow-up recommendations:  Activity:  Activity as tolerated Diet:  Regular diet Other:  Outpatient follow-up with [facility] would be ideal to get treatment for substance abuse  Mordecai Rasmussen, MD 07/19/2018, 9:32 AM

## 2018-07-19 NOTE — Progress Notes (Signed)
Patient being discharged today.  Have not seen the patient today.  However discussed with diabetes coordinator this morning about his compliance with taking insulin 4 times a day versus twice a day.  His insulin regimen has been changed to 70/30-35 units twice daily-  please see her note for more details.  Prescriptions for the same have been dispensed by psychiatry team.  Patient need to be educated about carb controlled diet and will need a strict outpatient follow-up for his diabetes.  He is also on metformin and linagliptin.

## 2018-07-21 ENCOUNTER — Inpatient Hospital Stay
Admission: EM | Admit: 2018-07-21 | Discharge: 2018-07-24 | DRG: 885 | Disposition: A | Discharge status: Home / Self Care | Payer: Medicaid Other | Source: Intra-hospital | Attending: Psychiatry | Admitting: Psychiatry

## 2018-07-21 ENCOUNTER — Encounter: Payer: Self-pay | Admitting: Emergency Medicine

## 2018-07-21 ENCOUNTER — Other Ambulatory Visit: Payer: Self-pay

## 2018-07-21 ENCOUNTER — Emergency Department
Admission: EM | Admit: 2018-07-21 | Discharge: 2018-07-21 | Disposition: A | Discharge status: Other Acute Inpatient Hospital | Payer: Medicaid Other | Source: Home / Self Care | Attending: Emergency Medicine | Admitting: Emergency Medicine

## 2018-07-21 ENCOUNTER — Emergency Department
Admission: EM | Admit: 2018-07-21 | Discharge: 2018-07-21 | Disposition: A | Discharge status: Home / Self Care | Payer: Medicaid Other | Attending: Emergency Medicine | Admitting: Emergency Medicine

## 2018-07-21 DIAGNOSIS — F17210 Nicotine dependence, cigarettes, uncomplicated: Secondary | ICD-10-CM | POA: Insufficient documentation

## 2018-07-21 DIAGNOSIS — F17290 Nicotine dependence, other tobacco product, uncomplicated: Secondary | ICD-10-CM | POA: Insufficient documentation

## 2018-07-21 DIAGNOSIS — F332 Major depressive disorder, recurrent severe without psychotic features: Secondary | ICD-10-CM | POA: Insufficient documentation

## 2018-07-21 DIAGNOSIS — R45851 Suicidal ideations: Secondary | ICD-10-CM | POA: Insufficient documentation

## 2018-07-21 DIAGNOSIS — I10 Essential (primary) hypertension: Secondary | ICD-10-CM | POA: Diagnosis present

## 2018-07-21 DIAGNOSIS — Z79899 Other long term (current) drug therapy: Secondary | ICD-10-CM | POA: Insufficient documentation

## 2018-07-21 DIAGNOSIS — G8929 Other chronic pain: Secondary | ICD-10-CM | POA: Diagnosis present

## 2018-07-21 DIAGNOSIS — Z794 Long term (current) use of insulin: Secondary | ICD-10-CM

## 2018-07-21 DIAGNOSIS — F1410 Cocaine abuse, uncomplicated: Secondary | ICD-10-CM | POA: Diagnosis present

## 2018-07-21 DIAGNOSIS — E119 Type 2 diabetes mellitus without complications: Secondary | ICD-10-CM | POA: Diagnosis present

## 2018-07-21 DIAGNOSIS — F1910 Other psychoactive substance abuse, uncomplicated: Secondary | ICD-10-CM

## 2018-07-21 DIAGNOSIS — Z5321 Procedure and treatment not carried out due to patient leaving prior to being seen by health care provider: Secondary | ICD-10-CM | POA: Diagnosis not present

## 2018-07-21 DIAGNOSIS — Z1159 Encounter for screening for other viral diseases: Secondary | ICD-10-CM | POA: Insufficient documentation

## 2018-07-21 DIAGNOSIS — F1420 Cocaine dependence, uncomplicated: Secondary | ICD-10-CM | POA: Insufficient documentation

## 2018-07-21 DIAGNOSIS — F99 Mental disorder, not otherwise specified: Secondary | ICD-10-CM | POA: Diagnosis present

## 2018-07-21 LAB — COMPREHENSIVE METABOLIC PANEL
ALT: 100 U/L — ABNORMAL HIGH (ref 0–44)
AST: 57 U/L — ABNORMAL HIGH (ref 15–41)
Albumin: 3.9 g/dL (ref 3.5–5.0)
Alkaline Phosphatase: 85 U/L (ref 38–126)
Anion gap: 12 (ref 5–15)
BUN: 8 mg/dL (ref 6–20)
CO2: 22 mmol/L (ref 22–32)
Calcium: 9.1 mg/dL (ref 8.9–10.3)
Chloride: 101 mmol/L (ref 98–111)
Creatinine, Ser: 0.65 mg/dL (ref 0.61–1.24)
GFR calc Af Amer: >60 mL/min (ref >60)
GFR calc non Af Amer: >60 mL/min (ref >60)
Glucose, Bld: 362 mg/dL — ABNORMAL HIGH (ref 70–99)
Potassium: 3.5 mmol/L (ref 3.5–5.1)
Sodium: 135 mmol/L (ref 135–145)
Total Bilirubin: 0.9 mg/dL (ref 0.3–1.2)
Total Protein: 7.7 g/dL (ref 6.5–8.1)

## 2018-07-21 LAB — ETHANOL: Alcohol, Ethyl (B): <10 mg/dL (ref <10)

## 2018-07-21 LAB — CBC
HCT: 41.6 % (ref 39.0–52.0)
Hemoglobin: 14.6 g/dL (ref 13.0–17.0)
MCH: 30.7 pg (ref 26.0–34.0)
MCHC: 35.1 g/dL (ref 30.0–36.0)
MCV: 87.4 fL (ref 80.0–100.0)
Platelets: 232 10*3/uL (ref 150–400)
RBC: 4.76 MIL/uL (ref 4.22–5.81)
RDW: 12.4 % (ref 11.5–15.5)
WBC: 13.3 10*3/uL — ABNORMAL HIGH (ref 4.0–10.5)
nRBC: 0 % (ref 0.0–0.2)

## 2018-07-21 LAB — URINE DRUG SCREEN, QUALITATIVE (ARMC ONLY)
Amphetamines, Ur Screen: NOT DETECTED
Barbiturates, Ur Screen: NOT DETECTED
Benzodiazepine, Ur Scrn: NOT DETECTED
Cannabinoid 50 Ng, Ur ~~LOC~~: NOT DETECTED
Cocaine Metabolite,Ur ~~LOC~~: POSITIVE — AB
MDMA (Ecstasy)Ur Screen: NOT DETECTED
Methadone Scn, Ur: NOT DETECTED
Opiate, Ur Screen: NOT DETECTED
Phencyclidine (PCP) Ur S: NOT DETECTED
Tricyclic, Ur Screen: NOT DETECTED

## 2018-07-21 LAB — GLUCOSE, CAPILLARY
Glucose-Capillary: 275 mg/dL — ABNORMAL HIGH (ref 70–99)
Glucose-Capillary: 341 mg/dL — ABNORMAL HIGH (ref 70–99)
Glucose-Capillary: 367 mg/dL — ABNORMAL HIGH (ref 70–99)
Glucose-Capillary: 214 mg/dL — ABNORMAL HIGH (ref 70–99)

## 2018-07-21 LAB — ACETAMINOPHEN LEVEL: Acetaminophen (Tylenol), Serum: <10 ug/mL — ABNORMAL LOW (ref 10–30)

## 2018-07-21 LAB — SARS CORONAVIRUS 2 BY RT PCR (HOSPITAL ORDER, PERFORMED IN ~~LOC~~ HOSPITAL LAB): SARS Coronavirus 2: NEGATIVE

## 2018-07-21 LAB — SALICYLATE LEVEL: Salicylate Lvl: <7 mg/dL (ref 2.8–30.0)

## 2018-07-21 MED ORDER — METFORMIN HCL 500 MG PO TABS
1000.0000 mg | ORAL_TABLET | Freq: Two times a day (BID) | ORAL | Status: DC
  Administered 2018-07-21: 10:00:00 1000 mg via ORAL
  Filled 2018-07-21: qty 2

## 2018-07-21 MED ORDER — INSULIN ASPART 100 UNIT/ML ~~LOC~~ SOLN
0.0000 [IU] | Freq: Three times a day (TID) | SUBCUTANEOUS | Status: DC
  Administered 2018-07-22: 8 [IU] via SUBCUTANEOUS
  Administered 2018-07-22: 5 [IU] via SUBCUTANEOUS
  Administered 2018-07-22 – 2018-07-23 (×2): 3 [IU] via SUBCUTANEOUS
  Administered 2018-07-23: 8 [IU] via SUBCUTANEOUS
  Administered 2018-07-24: 2 [IU] via SUBCUTANEOUS
  Filled 2018-07-21: qty 1

## 2018-07-21 MED ORDER — NICOTINE 21 MG/24HR TD PT24
21.0000 mg | MEDICATED_PATCH | Freq: Once | TRANSDERMAL | Status: AC
  Administered 2018-07-21: 17:00:00 21 mg via TRANSDERMAL
  Filled 2018-07-21: qty 1

## 2018-07-21 MED ORDER — ACETAMINOPHEN 325 MG PO TABS
650.0000 mg | ORAL_TABLET | Freq: Four times a day (QID) | ORAL | Status: DC | PRN
  Administered 2018-07-21 – 2018-07-24 (×4): 650 mg via ORAL
  Filled 2018-07-21 (×4): qty 2

## 2018-07-21 MED ORDER — BUSPIRONE HCL 5 MG PO TABS
15.0000 mg | ORAL_TABLET | Freq: Three times a day (TID) | ORAL | Status: DC
  Administered 2018-07-21 – 2018-07-24 (×7): 15 mg via ORAL
  Filled 2018-07-21 (×8): qty 3

## 2018-07-21 MED ORDER — INSULIN ASPART PROT & ASPART (70-30 MIX) 100 UNIT/ML ~~LOC~~ SUSP
35.0000 [IU] | Freq: Two times a day (BID) | SUBCUTANEOUS | Status: DC
  Administered 2018-07-21 – 2018-07-24 (×5): 35 [IU] via SUBCUTANEOUS
  Filled 2018-07-21 (×5): qty 10

## 2018-07-21 MED ORDER — ALUM & MAG HYDROXIDE-SIMETH 200-200-20 MG/5ML PO SUSP: 30.0000 mL | ORAL | Status: DC | PRN

## 2018-07-21 MED ORDER — IBUPROFEN 600 MG PO TABS
600.0000 mg | ORAL_TABLET | Freq: Four times a day (QID) | ORAL | Status: DC | PRN
  Administered 2018-07-21: 600 mg via ORAL
  Filled 2018-07-21: qty 1

## 2018-07-21 MED ORDER — MAGNESIUM HYDROXIDE 400 MG/5ML PO SUSP: 30.0000 mL | Freq: Every day | ORAL | Status: DC | PRN

## 2018-07-21 MED ORDER — NICOTINE 21 MG/24HR TD PT24
21.0000 mg | MEDICATED_PATCH | Freq: Once | TRANSDERMAL | Status: DC
  Administered 2018-07-21: 05:00:00 21 mg via TRANSDERMAL
  Filled 2018-07-21: qty 1

## 2018-07-21 MED ORDER — LINAGLIPTIN 5 MG PO TABS
5.0000 mg | ORAL_TABLET | Freq: Every day | ORAL | Status: DC
  Administered 2018-07-22 – 2018-07-24 (×3): 5 mg via ORAL
  Filled 2018-07-21 (×3): qty 1

## 2018-07-21 MED ORDER — ESCITALOPRAM OXALATE 10 MG PO TABS
10.0000 mg | ORAL_TABLET | Freq: Every day | ORAL | Status: DC
  Administered 2018-07-21: 14:00:00 10 mg via ORAL
  Filled 2018-07-21: qty 1

## 2018-07-21 MED ORDER — INSULIN ASPART 100 UNIT/ML ~~LOC~~ SOLN
5.0000 [IU] | Freq: Once | SUBCUTANEOUS | Status: AC
  Administered 2018-07-21: 16:00:00 5 [IU] via SUBCUTANEOUS
  Filled 2018-07-21: qty 1

## 2018-07-21 MED ORDER — TRAZODONE HCL 100 MG PO TABS
100.0000 mg | ORAL_TABLET | Freq: Every evening | ORAL | Status: DC | PRN
  Administered 2018-07-22 (×2): 100 mg via ORAL
  Filled 2018-07-21 (×2): qty 1

## 2018-07-21 MED ORDER — METFORMIN HCL 500 MG PO TABS
1000.0000 mg | ORAL_TABLET | Freq: Two times a day (BID) | ORAL | Status: DC
  Administered 2018-07-21 – 2018-07-24 (×5): 1000 mg via ORAL
  Filled 2018-07-21 (×6): qty 2

## 2018-07-21 MED ORDER — ESCITALOPRAM OXALATE 10 MG PO TABS
10.0000 mg | ORAL_TABLET | Freq: Every day | ORAL | Status: DC
  Administered 2018-07-22 – 2018-07-24 (×3): 10 mg via ORAL
  Filled 2018-07-21 (×3): qty 1

## 2018-07-21 MED ORDER — METFORMIN HCL 500 MG PO TABS: 1000.0000 mg | ORAL_TABLET | Freq: Two times a day (BID) | ORAL | Status: DC

## 2018-07-21 NOTE — ED Notes (Signed)
Pt dressed out in burgandy behavioral scrubs by this RN with Isabelle Course, EDT and [employer] officers present. Pt belongings include: 1 black jacket, 1 red t-shirt, 1 pr blue/teal tennis shoes, 1 pt gray sweat pants, 1 wristwatch, 1 cell phone, 1 pr gray socks, 1 pr blue boxer briefs. Pt did not arrive with a wallet, any cash, or other valuables.

## 2018-07-21 NOTE — ED Triage Notes (Addendum)
Patient ambulatory to triage with steady gait, without difficulty or distress noted, accomp by brother; pt here 5/15, admitted to [facility] and d/c 5/20; st he is here for suicidal thoughts but no specific plan; st he did not speak to any psychiatrist while he was here

## 2018-07-21 NOTE — ED Notes (Signed)
Gave patient a urinal due another patient in bathroom at this time.

## 2018-07-21 NOTE — ED Notes (Signed)
ED Provider and this RN at bedside.
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
## 2018-07-21 NOTE — BH Assessment (Addendum)
Patient is to be admitted to [facility] by Dr. Toni Amend.  Attending Physician will be Dr. Toni Amend.   Patient has been assigned to room 302, by [facility] Charge Nurse Demetria.   Intake Paper Work has been signed and placed on patient chart.   ER staff is aware of the admission:  Glenda, ER Secretary    Dr. Cyril Loosen, ER MD   Elizebeth Koller., Patient's Nurse   Laveda Abbe, Patient Access.
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
## 2018-07-21 NOTE — ED Notes (Addendum)
After registering and giving triage information, pt walked out ED lobby; called pt several times on mobile phone with no answer

## 2018-07-21 NOTE — ED Notes (Signed)
Informed MD Roxan Hockey that patients blood sugar was 341 mg/dl, asked if we could administer any medication to assist in lowering his blood sugar. MD ordered Novolog 5 units.

## 2018-07-21 NOTE — Tx Team (Signed)
Initial Treatment Plan 07/21/2018 6:26 PM [name] [id number]    PATIENT STRESSORS: Marital or family conflict Substance abuse   PATIENT STRENGTHS: General fund of knowledge Motivation for treatment/growth   PATIENT IDENTIFIED PROBLEMS: SI  Substance abuse  Depression                 DISCHARGE CRITERIA:  Ability to meet basic life and health needs Improved stabilization in mood, thinking, and/or behavior Reduction of life-threatening or endangering symptoms to within safe limits  PRELIMINARY DISCHARGE PLAN: Outpatient therapy Return to previous living arrangement  PATIENT/FAMILY INVOLVEMENT: This treatment plan has been presented to and reviewed with the patient, Daniel [name].  The patient has been given the opportunity to ask questions and make suggestions.  Cristopher Ciccarelli, RN 07/21/2018, 6:26 PM

## 2018-07-21 NOTE — Progress Notes (Signed)
Inpatient Diabetes Program Recommendations  AACE/ADA: New Consensus Statement on Inpatient Glycemic Control (2015)  Target Ranges:  Prepandial:   less than 140 mg/dL      Peak postprandial:   less than 180 mg/dL (1-2 hours)      Critically ill patients:  140 - 180 mg/dL   Results for Daniel [name], Daniel [name] (MRN [id number]) as of 07/21/2018 08:56  Ref. Range 07/21/2018 04:33  Glucose Latest Ref Range: 70 - 99 mg/dL 761 (H)     To ED with suicidal ideation--major depressive disorder and polysubstance abuse--Presented earlier to the triage area at [facility] and stated that he had suicidal ideation and subsequently eloped--Involuntarily committed and police officers returned the pt to the ED--Admits to Crack Cocaine use as well  History: DM (new diagnosis made last admission last week 07/15/2018)  Home DM Meds: 70/30 Insulin- 35 units BID (NOT taking)       Novolog SSi (NOT taking)       Metformin 1000 mg BID (NOT taking)        Tradjenta 5 mg Daily (NOT taking)      History of DM (new diagnosis made last admission last week 07/15/2018)--Counseled by the DM Coordinator on 07/17/2018 and again on 07/18/2018.  Discharged on 07/19/2018 and right back to ED today 05/22.    MD- Please consider the following in-hospital insulin adjustments:   1. Start 70/30 Insulin- 25 units BID with meals (70% total home dose of 70/30 Insulin)  2. Start Novolog Moderate Correction Scale/ SSI (0-15 units) TID AC + HS      --Will follow patient during hospitalization--  Ambrose Finland RN, MSN, CDE Diabetes Coordinator Inpatient Glycemic Control Team Team Pager: [phone] (8a-5p)

## 2018-07-21 NOTE — Plan of Care (Signed)
New admission.  Problem: Education: Goal: Knowledge of [facility] Education information/materials will improve Outcome: Not Progressing Goal: Emotional status will improve Outcome: Not Progressing Goal: Mental status will improve Outcome: Not Progressing Goal: Verbalization of understanding the information provided will improve Outcome: Not Progressing   Problem: Health Behavior/Discharge Planning: Goal: Identification of resources available to assist in meeting health care needs will improve Outcome: Not Progressing Goal: Compliance with treatment plan for underlying cause of condition will improve Outcome: Not Progressing   Problem: Safety: Goal: Periods of time without injury will increase Outcome: Not Progressing   Problem: Coping: Goal: Coping ability will improve Outcome: Not Progressing Goal: Will verbalize feelings Outcome: Not Progressing   Problem: Role Relationship: Goal: Will demonstrate positive changes in social behaviors and relationships Outcome: Not Progressing   Problem: Health Behavior/Discharge Planning: Goal: Identification of resources available to assist in meeting health care needs will improve Outcome: Not Progressing   Problem: Self-Concept: Goal: Ability to disclose and discuss suicidal ideas will improve Outcome: Not Progressing Goal: Will verbalize positive feelings about self Outcome: Not Progressing

## 2018-07-21 NOTE — ED Provider Notes (Signed)
[facility] _   First MD Initiated Contact with Patient 07/21/18 [phone]     (approximate)  I have reviewed the triage vital signs and the nursing notes.   HISTORY  Chief Complaint Mental Health Problem    HPI Daniel [name] is a 53 y.o. male with below list of previous medical conditions including major depressive disorder and polysubstance abuse presents to the emergency department secondary to suicidal ideation.  Patient presented earlier to the triage area here at [facility] and stated that he had suicidal ideation and subsequently eloped.  I was made aware of this and as such I involuntarily committed the patient and had police officers return the patient to the emergency department.  Patient states that he has continued suicidal ideation.  Patient does admit to crack cocaine use.        Past Medical History:  Diagnosis Date  . Allergy   . Anxiety   . Back pain   . Chronic pain    2/2 accident/trauma  . Hypertension   . Opioid abuse ([place])   . Urinary retention     Patient Active Problem List   Diagnosis Date Noted  . MDD (major depressive disorder), severe ([place]) 07/14/2018  . Cocaine abuse ([place]) 07/14/2018  . Marijuana user 02/06/2018  . Inflammatory spondylopathy of lumbar region ([place]) 06/01/2017  . Morbid obesity ([place]) 06/01/2017  . Chronic midline low back pain with right-sided sciatica 06/01/2017  . Encounter for smoking cessation counseling 06/01/2017  . History of substance abuse ([place]) 03/31/2017  . Gastroesophageal reflux disease 03/31/2017  . Major depression 06/20/2015  . Tobacco use disorder 06/20/2015  . Chronic pain 02/24/2015  . Hypertension 08/02/2014    Past Surgical History:  Procedure Laterality Date  . BACK SURGERY    . KNEE SURGERY Left     Prior to Admission medications   Medication Sig Start Date End Date Taking? Authorizing Provider  escitalopram (LEXAPRO) 10 MG  tablet Take 1 tablet (10 mg total) by mouth daily. 07/19/18  Yes Clapacs, Madie Reno, MD  blood glucose meter kit and supplies KIT Dispense based on patient and insurance preference. Use up to four times daily as directed. (FOR ICD-9 250.00, 250.01). 07/19/18   Clapacs, Madie Reno, MD  buprenorphine (SUBUTEX) 8 MG SUBL SL tablet Place 8 mg under the tongue 2 (two) times a day. 02/08/18   [provider]  busPIRone (BUSPAR) 15 MG tablet Take 1 tablet (15 mg total) by mouth 3 (three) times daily. Patient not taking: Reported on 07/21/2018 07/18/18   Clapacs, Madie Reno, MD  insulin aspart (NOVOLOG) 100 UNIT/ML injection Inject 0-20 Units into the skin 3 (three) times daily with meals. Check blood sugars prior to each meal.  Use sliding scale for this additional insulin. Patient not taking: Reported on 07/21/2018 07/19/18   Clapacs, Madie Reno, MD  insulin aspart protamine- aspart (NOVOLOG MIX 70/30) (70-30) 100 UNIT/ML injection Inject 0.35 mLs (35 Units total) into the skin 2 (two) times daily with a meal. Patient not taking: Reported on 07/21/2018 07/19/18   Clapacs, Madie Reno, MD  linagliptin (TRADJENTA) 5 MG TABS tablet Take 1 tablet (5 mg total) by mouth daily. Patient not taking: Reported on 07/21/2018 07/19/18   Clapacs, Madie Reno, MD  metFORMIN (GLUCOPHAGE) 1000 MG tablet Take 1 tablet (1,000 mg total) by mouth 2 (two) times daily with a meal. Patient not taking: Reported on 07/21/2018 07/19/18   Clapacs, Madie Reno, MD  traZODone (DESYREL) 100 MG tablet Take 1 tablet (100 mg total) by mouth at bedtime as needed for sleep. Patient not taking: Reported on 07/21/2018 07/18/18   Clapacs, Madie Reno, MD    Allergies Hydrocodone-acetaminophen and Suboxone [buprenorphine hcl-naloxone hcl]  Family History  Problem Relation Age of Onset  . Alcohol abuse Father     Social History Social History   Tobacco Use  . Smoking status: Current Every Day Smoker    Packs/day: 1.00    Types: E-cigarettes, Cigarettes    Last attempt  to quit: 03/16/2015    Years since quitting: 3.3  . Smokeless tobacco: Never Used  Substance Use Topics  . Alcohol use: No  . Drug use: No    Comment: Former use of cocaine and opioids    Review of Systems Constitutional: No fever/chills Eyes: No visual changes. ENT: No sore throat. Cardiovascular: Denies chest pain. Respiratory: Denies shortness of breath. Gastrointestinal: No abdominal pain.  No nausea, no vomiting.  No diarrhea.  No constipation. Genitourinary: Negative for dysuria. Musculoskeletal: Negative for neck pain.  Negative for back pain. Integumentary: Negative for rash. Neurological: Negative for headaches, focal weakness or numbness. Psychiatric:  Positive for suicidal ideation and substance abuse  ____________________________________________   PHYSICAL EXAM:  VITAL SIGNS: ED Triage Vitals  Enc Vitals Group     BP 07/21/18 0421 (!) 151/93     Pulse Rate 07/21/18 0421 (!) 120     Resp 07/21/18 0421 20     Temp 07/21/18 0421 99.9 F (37.7 C)     Temp Source 07/21/18 0421 Oral     SpO2 07/21/18 0421 96 %     Weight 07/21/18 0422 113.4 kg (250 lb)     Height 07/21/18 0422 1.829 m (6')     Head Circumference --      Peak Flow --      Pain Score 07/21/18 0422 0     Pain Loc --      Pain Edu? --      Excl. in [place]? --     Constitutional: Alert and oriented. Well appearing and in no acute distress.  Depressed mood Eyes: Conjunctivae are normal.  Mouth/Throat: Mucous membranes are moist.  Oropharynx non-erythematous. Neck: No stridor. Cardiovascular: Normal rate, regular rhythm. Good peripheral circulation. Grossly normal heart sounds. Respiratory: Normal respiratory effort.  No retractions. No audible wheezing. Gastrointestinal: Soft and nontender. No distention.  Musculoskeletal: No lower extremity tenderness nor edema. No gross deformities of extremities. Neurologic:  Normal speech and language. No gross focal neurologic deficits are appreciated.  Skin:   Skin is warm, dry and intact. No rash noted. Psychiatric: Depressed affect speech and behavior are normal.  ____________________________________________   LABS (all labs ordered are listed, but only abnormal results are displayed)  Labs Reviewed  COMPREHENSIVE METABOLIC PANEL - Abnormal; Notable for the following components:      Result Value   Glucose, Bld 362 (*)    AST 57 (*)    ALT 100 (*)    All other components within normal limits  ACETAMINOPHEN LEVEL - Abnormal; Notable for the following components:   Acetaminophen (Tylenol), Serum <10 (*)    All other components within normal limits  CBC - Abnormal; Notable for the following components:   WBC 13.3 (*)    All other components within normal limits  ETHANOL  SALICYLATE LEVEL  URINE DRUG SCREEN, QUALITATIVE ([facility] ONLY)       PROCEDURES   Procedure(s) performed (including Critical Care):  Procedures   ____________________________________________   INITIAL IMPRESSION / MDM / ASSESSMENT AND PLAN / ED COURSE  As part of my medical decision making, I reviewed the following data within the electronic MEDICAL RECORD NUMBER   53 year old male presenting with above-stated history and physical exam secondary to suicidal ideation.  Awaiting psychiatry consultation  *Daniel [name] was evaluated in Emergency Department on 07/21/2018 for the symptoms described in the history of present illness. He was evaluated in the context of the global COVID-19 pandemic, which necessitated consideration that the patient might be at risk for infection with the SARS-CoV-2 virus that causes COVID-19. Institutional protocols and algorithms that pertain to the evaluation of patients at risk for COVID-19 are in a state of rapid change based on information released by regulatory bodies including the CDC and federal and state organizations. These policies and algorithms were followed during the patient's care in the ED.  Some ED evaluations and  interventions may be delayed as a result of limited staffing during the pandemic.*    ____________________________________________  FINAL CLINICAL IMPRESSION(S) / ED DIAGNOSES  Final diagnoses:  Substance abuse ([place])  Suicidal ideation     MEDICATIONS GIVEN DURING THIS VISIT:  Medications  nicotine (NICODERM CQ - dosed in mg/24 hours) patch 21 mg (21 mg Transdermal Patch Applied 07/21/18 0453)     ED Discharge Orders    None       Note:  This document was prepared using Dragon voice recognition software and may include unintentional dictation errors.   Gregor Hams, MD 07/21/18 [phone]

## 2018-07-21 NOTE — BH Assessment (Signed)
Assessment Note  Daniel [name] is an 53 y.o. male who presents to ED with worsening depression and suicidal ideations. Pt reports having a plan to drive his Zenaida Niece into a brick wall. Pt was clear and coherent in his speech; however, pt had a flat affect throughout assessment. Pt reports he was unable to follow-up with his previously scheduled outpatient appointment that was arranged prior to his most recent discharge from the [facility]. Pt reports recent use of crack cocaine "a couple of days ago" while denying use of all other substances. He reports his living situation is a stressor due to his brother living in the home. Pt denied HI/AVH. Pt did not appear to be responding to internal stimuli while speaking with his Clinical research associate.  Diagnosis:  Major Depressive Disorder Cocaine Use Disorder, Severe  Past Medical History:  Past Medical History:  Diagnosis Date  . Allergy   . Anxiety   . Back pain   . Chronic pain    2/2 accident/trauma  . Hypertension   . Opioid abuse ([facility]CC)   . Urinary retention     Past Surgical History:  Procedure Laterality Date  . BACK SURGERY    . KNEE SURGERY Left     Family History:  Family History  Problem Relation Age of Onset  . Alcohol abuse Father     Social History:  reports that he has been smoking e-cigarettes and cigarettes. He has been smoking about 1.00 pack per day. He has never used smokeless tobacco. He reports that he does not drink alcohol or use drugs.  Additional Social History:  Alcohol / Drug Use Pain Medications: SEE MAR Prescriptions: SEE MAR Over the Counter: SEE MAR History of alcohol / drug use?: Yes Negative Consequences of Use: Financial, Personal relationships Withdrawal Symptoms: Otho Bellows) Substance #1 Name of Substance 1: Crack Cocaine  1 - Age of First Use: Unable to quantify 1 - Amount (size/oz): Unable to quantify 1 - Frequency: Unable to quantify 1 - Duration: Unable to quantify 1 - Last Use / Amount: "a couple of days  ago"  CIWA: CIWA-Ar BP: 107/81 Pulse Rate: 96 COWS:    Allergies:  Allergies  Allergen Reactions  . Hydrocodone-Acetaminophen Hives    Vicodin/Norco  . Suboxone [Buprenorphine Hcl-Naloxone Hcl] Other (See Comments)    Urinary retention    Home Medications: (Not in a hospital admission)   OB/GYN Status:  No LMP for male patient.  General Assessment Data Location of Assessment: [facility] ED TTS Assessment: In system Is this a Tele or Face-to-Face Assessment?: Face-to-Face Is this an Initial Assessment or a Re-assessment for this encounter?: Initial Assessment Patient Accompanied by:: N/A Language Other than English: No Living Arrangements: Other (Comment) What gender do you identify as?: Male Marital status: Single Pregnancy Status: No Living Arrangements: Other relatives Can pt return to current living arrangement?: Yes Admission Status: Involuntary Petitioner: ED Attending Is patient capable of signing voluntary admission?: No Referral Source: MD Insurance type: [place] Medicaid  Medical Screening Exam [facility] Walk-in ONLY) Medical Exam completed: Yes  Crisis Care Plan Living Arrangements: Other relatives Legal Guardian: Other:(Self) Name of Psychiatrist: n/a Name of Therapist: n/a  Education Status Is patient currently in school?: No Highest grade of school patient has completed: 12th grade Is the patient employed, unemployed or receiving disability?: Receiving disability income  Risk to self with the past 6 months Suicidal Ideation: Yes-Currently Present Has patient been a risk to self within the past 6 months prior to admission? : Yes Suicidal  Intent: Yes-Currently Present Has patient had any suicidal intent within the past 6 months prior to admission? : Yes Is patient at risk for suicide?: Yes Suicidal Plan?: Yes-Currently Present Has patient had any suicidal plan within the past 6 months prior to admission? : Yes Specify Current Suicidal Plan: To drive his Zenaida Niecevan  into a brick wall Access to Means: Yes Specify Access to Suicidal Means: Pt has access to a vehicle What has been your use of drugs/alcohol within the last 12 months?: Active addiction - crack cocaine use Previous Attempts/Gestures: Yes How many times?: Otho Bellows([facility] - Pt unable to recall) Other Self Harm Risks: N/A Triggers for Past Attempts: Family contact Intentional Self Injurious Behavior: None Family Suicide History: No Recent stressful life event(s): Turmoil (Comment)(Living situation with brother) Persecutory voices/beliefs?: No Depression: Yes Depression Symptoms: Despondent, Insomnia, Tearfulness, Isolating, Fatigue, Guilt, Loss of interest in usual pleasures, Feeling worthless/self pity, Feeling angry/irritable Substance abuse history and/or treatment for substance abuse?: Yes Suicide prevention information given to non-admitted patients: Not applicable  Risk to Others within the past 6 months Homicidal Ideation: No Does patient have any lifetime risk of violence toward others beyond the six months prior to admission? : No Thoughts of Harm to Others: No Current Homicidal Intent: No Current Homicidal Plan: No Access to Homicidal Means: No Identified Victim: None History of harm to others?: No Assessment of Violence: None Noted Violent Behavior Description: Denies Does patient have access to weapons?: No Criminal Charges Pending?: No Does patient have a court date: No Is patient on probation?: No  Psychosis Hallucinations: None noted Delusions: None noted  Mental Status Report Appearance/Hygiene: In scrubs Eye Contact: Fair Motor Activity: Freedom of movement Speech: Logical/coherent Level of Consciousness: Alert Mood: Depressed Affect: Flat Anxiety Level: Minimal Thought Processes: Coherent, Relevant Judgement: Unimpaired Orientation: Person, Place, Time, Situation, Appropriate for developmental age Obsessive Compulsive Thoughts/Behaviors: None  Cognitive  Functioning Concentration: Normal Memory: Recent Intact, Remote Intact Is patient IDD: No Insight: Poor Impulse Control: Poor Appetite: Good Have you had any weight changes? : No Change Sleep: Decreased Total Hours of Sleep: 3 Vegetative Symptoms: Staying in bed  ADLScreening [facility] Assessment Services) Patient's cognitive ability adequate to safely complete daily activities?: Yes Patient able to express need for assistance with ADLs?: Yes Independently performs ADLs?: Yes (appropriate for developmental age)  Prior Inpatient Therapy Prior Inpatient Therapy: Yes Prior Therapy Dates: (06/2018 (2 days ago)) Prior Therapy Facilty[facility]Provider[facility] Reason for Treatment: Depression/SI  Prior Outpatient Therapy Prior Outpatient Therapy: Yes Prior Therapy Dates: current Prior Therapy Facilty/Provider(s): [facility] Reason for Treatment: Depression Does patient have an ACCT team?: No Does patient have Intensive In-House Services?  : No Does patient have [facility] services? : No Does patient have P4CC services?: No  ADL Screening (condition at time of admission) Patient's cognitive ability adequate to safely complete daily activities?: Yes Patient able to express need for assistance with ADLs?: Yes Independently performs ADLs?: Yes (appropriate for developmental age)       Abuse/Neglect Assessment (Assessment to be complete while patient is alone) Abuse/Neglect Assessment Can Be Completed: Yes Physical Abuse: Denies Verbal Abuse: Denies Sexual Abuse: Denies Exploitation of patient/patient's resources: Denies Self-Neglect: Denies Values / Beliefs Cultural Requests During Hospitalization: None Spiritual Requests During Hospitalization: None Consults Spiritual Care Consult Needed: No Social Work Consult Needed: No Merchant navy officerAdvance Directives (For Healthcare) Does Patient Have a Medical Advance Directive?: No Would patient like information on creating a medical advance directive?: No -  Patient declined  Child/Adolescent Assessment Running Away Risk: (Patient is an adult)  Disposition:  Disposition Initial Assessment Completed for this Encounter: Yes Disposition of Patient: Admit Type of inpatient treatment program: Adult Patient refused recommended treatment: No Mode of transportation if patient is discharged/movement?: Car Patient referred to: Other (Comment)([facility] BMU)  On Site Evaluation by:   Reviewed with Physician:    Wilmon Arms 07/21/2018 4:11 PM

## 2018-07-21 NOTE — ED Notes (Addendum)
Called again, pt answers & st he is "feeling fine now and is at home with his brother"; pt instructed to return to the ED so that he can be evaluated but refuses; charge nurse notified

## 2018-07-21 NOTE — ED Notes (Addendum)
Spoke with Dr Manson Passey regarding pt presenting c/o and elopement from lobby; will process IVC papers, charge nurse aware

## 2018-07-21 NOTE — ED Triage Notes (Signed)
Patient ambulatory to triage with steady gait, without difficulty or distress noted, in custody of [place] PD for IVC; pt here 5/15, d/c 5/20 for SI; returned voluntarily earlier this morning for SI but left prior to completion of triage

## 2018-07-21 NOTE — ED Notes (Addendum)
Patient being transferred to [facility] [address] with staff and [facility], patient received transfer papers. Patient received belongings and verbalized he has received all of his belongings. Patient appropriate and cooperative, Denies SI/HI AVH. Vital signs taken. NAD noted.

## 2018-07-21 NOTE — [facility] Suicide Risk Assessment (Signed)
[facility]   Nursing information obtained from:    Demographic factors:    Current Mental Status:    Loss Factors:    Historical Factors:    Risk Reduction Factors:     Total Time spent with patient: 1 hour Principal Problem: Severe recurrent major depression without psychotic features (HCC) Diagnosis:  Principal Problem:   Severe recurrent major depression without psychotic features (HCC) Active Problems:   Hypertension   Chronic pain   Cocaine abuse (HCC)   Diabetes (HCC)  Subjective Data: Patient seen chart reviewed.  Patient reports he is extremely sad and depressed.  Claims to have suicidal ideation with thoughts of running his car into a brick wall.  Has been using cocaine.  Feels hopeless and negative.  Low energy.  No psychotic symptoms.  Continued Clinical Symptoms:    The "Alcohol Use Disorders Identification Test", Guidelines for Use in Primary Care, Second Edition.  World Science writerHealth Organization [facility](WHO). Score between 0-7:  no or low risk or alcohol related problems. Score between 8-15:  moderate risk of alcohol related problems. Score between 16-19:  high risk of alcohol related problems. Score 20 or above:  warrants further diagnostic evaluation for alcohol dependence and treatment.   CLINICAL FACTORS:   Depression:   Comorbid alcohol abuse/dependence Alcohol/Substance Abuse/Dependencies   Musculoskeletal: Strength & Muscle Tone: within normal limits Gait & Station: normal Patient leans: N/A  Psychiatric Specialty Exam: Physical Exam  Nursing note and vitals reviewed. Constitutional: He appears well-developed and well-nourished.  HENT:  Head: Normocephalic and atraumatic.  Eyes: Pupils are equal, round, and reactive to light. Conjunctivae are normal.  Neck: Normal range of motion.  Cardiovascular: Regular rhythm and normal heart sounds.  Respiratory: Effort normal. No respiratory distress.  GI: Soft.  Musculoskeletal: Normal range  of motion.  Neurological: He is alert.  Skin: Skin is warm and dry.  Psychiatric: His speech is delayed. He is slowed. Cognition and memory are impaired. He expresses impulsivity. He exhibits a depressed mood. He expresses suicidal ideation. He expresses suicidal plans.    Review of Systems  Constitutional: Negative.   HENT: Negative.   Eyes: Negative.   Respiratory: Negative.   Cardiovascular: Negative.   Gastrointestinal: Negative.   Musculoskeletal: Negative.   Skin: Negative.   Neurological: Negative.   Psychiatric/Behavioral: Positive for depression, substance abuse and suicidal ideas.    There were no vitals taken for this visit.There is no height or weight on file to calculate BMI.  General Appearance: Disheveled  Eye Contact:  Minimal  Speech:  Slow  Volume:  Decreased  Mood:  Depressed  Affect:  Congruent  Thought Process:  Coherent  Orientation:  Full (Time, Place, and Person)  Thought Content:  Rumination  Suicidal Thoughts:  Yes.  with intent/plan  Homicidal Thoughts:  No  Memory:  Immediate;   Fair Recent;   Fair Remote;   Fair  Judgement:  Impaired  Insight:  Shallow  Psychomotor Activity:  Decreased  Concentration:  Concentration: Fair  Recall:  FiservFair  Fund of Knowledge:  Fair  Language:  Poor  Akathisia:  No  Handed:  Right  AIMS (if indicated):     Assets:  Housing  ADL's:  Intact  Cognition:  Impaired,  Mild  Sleep:         COGNITIVE FEATURES THAT CONTRIBUTE TO RISK:  Closed-mindedness    SUICIDE RISK:   Mild:  Suicidal ideation of limited frequency, intensity, duration, and specificity.  There are no identifiable  plans, no associated intent, mild dysphoria and related symptoms, good self-control (both objective and subjective assessment), few other risk factors, and identifiable protective factors, including available and accessible social support.  PLAN OF CARE: 15-minute checks.  Restart medicine for depression and anxiety.  Engage in  individual and group therapy.  Work on encouraging patient to apply himself to outpatient treatment.  Reassess suicidality prior to discharge  I certify that inpatient services furnished can reasonably be expected to improve the patient's condition.   Mordecai Rasmussen, MD 07/21/2018, 5:28 PM

## 2018-07-21 NOTE — H&P (Signed)
Psychiatric Admission Assessment Adult  Patient Identification: Daniel [name] MRN:  [id number] Date of Evaluation:  07/21/2018 Chief Complaint:  major depressive disorder Principal Diagnosis: Severe recurrent major depression without psychotic features ([place]) Diagnosis:  Principal Problem:   Severe recurrent major depression without psychotic features ([place]) Active Problems:   Hypertension   Chronic pain   Cocaine abuse ([place])   Diabetes ([place])  History of Present Illness: Patient seen in the emergency room.  Just discharged from the hospital 2 days ago.  Came back to the emergency room this morning agitated talking about suicidal ideation.  Initially eloped from the emergency room had to be placed under commitment and brought back by law enforcement.  Patient will only say that he has "bad depression".  He got back home and found his stresses for the same.  Brother is still there.  Substance abuse still going on.  Patient admits to cocaine use.  Says he is having thoughts of running his car into a brick wall.  Has not slept well has not a has not taken his medicine correctly and has not monitored his blood sugar. Associated Signs/Symptoms: Depression Symptoms:  depressed mood, anhedonia, insomnia, suicidal thoughts with specific plan, (Hypo) Manic Symptoms:  None Anxiety Symptoms:  Excessive Worry, Psychotic Symptoms:  None PTSD Symptoms: Negative Total Time spent with patient: 1 hour  Past Psychiatric History: Patient has a long history of substance abuse and multiple hospitalizations.  Usually presents with complaints of suicidal ideation.  Almost never sticks with his treatment plan outside the hospital.  Symptoms tend to come and go very readily depending on what seems to be convenient for him.  Is the patient at risk to self? Yes.    Has the patient been a risk to self in the past 6 months? Yes.    Has the patient been a risk to self within the distant past? Yes.    Is the patient  a risk to others? No.  Has the patient been a risk to others in the past 6 months? No.  Has the patient been a risk to others within the distant past? No.   Prior Inpatient Therapy:   Prior Outpatient Therapy:    Alcohol Screening:   Substance Abuse History in the last 12 months:  Yes.   Consequences of Substance Abuse: Medical Consequences:  Worsening general medical care.  Worsening depression.  Poor self-care. Previous Psychotropic Medications: Yes  Psychological Evaluations: Yes  Past Medical History:  Past Medical History:  Diagnosis Date  . Allergy   . Anxiety   . Back pain   . Chronic pain    2/2 accident/trauma  . Hypertension   . Opioid abuse ([place])   . Urinary retention     Past Surgical History:  Procedure Laterality Date  . BACK SURGERY    . KNEE SURGERY Left    Family History:  Family History  Problem Relation Age of Onset  . Alcohol abuse Father    Family Psychiatric  History: Positive for substance abuse and several people Tobacco Screening:   Social History:  Social History   Substance and Sexual Activity  Alcohol Use No     Social History   Substance and Sexual Activity  Drug Use No   Comment: Former use of cocaine and opioids    Additional Social History:                           Allergies:  Allergies  Allergen Reactions  . Hydrocodone-Acetaminophen Hives    Vicodin/Norco  . Suboxone [Buprenorphine Hcl-Naloxone Hcl] Other (See Comments)    Urinary retention   Lab Results:  Results for orders placed or performed during the hospital encounter of 07/21/18 (from the past 48 hour(s))  Glucose, capillary     Status: Abnormal   Collection Time: 07/21/18  4:41 PM  Result Value Ref Range   Glucose-Capillary 367 (H) 70 - 99 mg/dL    Blood Alcohol level:  Lab Results  Component Value Date   ETH <10 07/21/2018   ETH <10 15/52/0802    Metabolic Disorder Labs:  Lab Results  Component Value Date   HGBA1C 10.4 (H) 07/14/2018    MPG 251.78 07/14/2018   No results found for: PROLACTIN Lab Results  Component Value Date   CHOL 140 02/24/2015   TRIG 89 02/24/2015   HDL 32 (L) 02/24/2015   CHOLHDL 4.4 02/24/2015   VLDL 18 02/24/2015   LDLCALC 90 02/24/2015   LDLCALC 44 11/29/2011    Current Medications: Current Facility-Administered Medications  Medication Dose Route Frequency Provider Last Rate Last Dose  . acetaminophen (TYLENOL) tablet 650 mg  650 mg Oral Q6H PRN Clapacs, John T, MD      . alum & mag hydroxide-simeth (MAALOX/MYLANTA) 200-200-20 MG/5ML suspension 30 mL  30 mL Oral Q4H PRN Clapacs, John T, MD      . busPIRone (BUSPAR) tablet 15 mg  15 mg Oral TID Clapacs, Madie Reno, MD      . Derrill Memo ON 07/22/2018] escitalopram (LEXAPRO) tablet 10 mg  10 mg Oral Daily Clapacs, John T, MD      . Derrill Memo ON 07/22/2018] insulin aspart (novoLOG) injection 0-15 Units  0-15 Units Subcutaneous TID WC Clapacs, John T, MD      . insulin aspart protamine- aspart (NOVOLOG MIX 70/30) injection 35 Units  35 Units Subcutaneous BID WC Clapacs, John T, MD      . Derrill Memo ON 07/22/2018] linagliptin (TRADJENTA) tablet 5 mg  5 mg Oral Daily Clapacs, John T, MD      . magnesium hydroxide (MILK OF MAGNESIA) suspension 30 mL  30 mL Oral Daily PRN Clapacs, John T, MD      . metFORMIN (GLUCOPHAGE) tablet 1,000 mg  1,000 mg Oral BID WC Clapacs, John T, MD      . nicotine (NICODERM CQ - dosed in mg/24 hours) patch 21 mg  21 mg Transdermal Once Clapacs, John T, MD      . traZODone (DESYREL) tablet 100 mg  100 mg Oral QHS PRN Clapacs, Madie Reno, MD       PTA Medications: Medications Prior to Admission  Medication Sig Dispense Refill Last Dose  . blood glucose meter kit and supplies KIT Dispense based on patient and insurance preference. Use up to four times daily as directed. (FOR ICD-9 250.00, 250.01). 1 each 0   . buprenorphine (SUBUTEX) 8 MG SUBL SL tablet Place 8 mg under the tongue 2 (two) times a day.   Not Taking at Unknown time  . busPIRone  (BUSPAR) 15 MG tablet Take 1 tablet (15 mg total) by mouth 3 (three) times daily. (Patient not taking: Reported on 07/21/2018) 90 tablet 1 Not Taking at Unknown time  . escitalopram (LEXAPRO) 10 MG tablet Take 1 tablet (10 mg total) by mouth daily. 30 tablet 1 07/20/2018 at am  . insulin aspart (NOVOLOG) 100 UNIT/ML injection Inject 0-20 Units into the skin 3 (three) times daily with meals.  Check blood sugars prior to each meal.  Use sliding scale for this additional insulin. (Patient not taking: Reported on 07/21/2018) 10 mL 1 Not Taking at Unknown time  . insulin aspart protamine- aspart (NOVOLOG MIX 70/30) (70-30) 100 UNIT/ML injection Inject 0.35 mLs (35 Units total) into the skin 2 (two) times daily with a meal. (Patient not taking: Reported on 07/21/2018) 10 mL 2 Not Taking at Unknown time  . linagliptin (TRADJENTA) 5 MG TABS tablet Take 1 tablet (5 mg total) by mouth daily. (Patient not taking: Reported on 07/21/2018) 30 tablet 1 Not Taking at Unknown time  . metFORMIN (GLUCOPHAGE) 1000 MG tablet Take 1 tablet (1,000 mg total) by mouth 2 (two) times daily with a meal. (Patient not taking: Reported on 07/21/2018) 60 tablet 1 Not Taking at Unknown time  . traZODone (DESYREL) 100 MG tablet Take 1 tablet (100 mg total) by mouth at bedtime as needed for sleep. (Patient not taking: Reported on 07/21/2018) 30 tablet 1 Not Taking at Unknown time    Musculoskeletal: Strength & Muscle Tone: decreased Gait & Station: unsteady Patient leans: N/A  Psychiatric Specialty Exam: Physical Exam  Nursing note and vitals reviewed. Constitutional: He appears well-developed and well-nourished.  HENT:  Head: Normocephalic and atraumatic.  Eyes: Pupils are equal, round, and reactive to light. Conjunctivae are normal.  Neck: Normal range of motion.  Cardiovascular: Regular rhythm and normal heart sounds.  Respiratory: Effort normal.  GI: Soft.  Musculoskeletal: Normal range of motion.  Neurological: He is alert.   Skin: Skin is warm and dry.  Psychiatric: His affect is blunt. His speech is delayed. He is slowed. Cognition and memory are normal. He expresses impulsivity. He exhibits a depressed mood. He expresses suicidal ideation. He expresses suicidal plans.    Review of Systems  Constitutional: Negative.   HENT: Negative.   Eyes: Negative.   Respiratory: Negative.   Cardiovascular: Negative.   Gastrointestinal: Negative.   Musculoskeletal: Negative.   Skin: Negative.   Neurological: Negative.   Psychiatric/Behavioral: Positive for depression, substance abuse and suicidal ideas.    There were no vitals taken for this visit.There is no height or weight on file to calculate BMI.  General Appearance: Disheveled  Eye Contact:  Minimal  Speech:  Slow  Volume:  Decreased  Mood:  Depressed  Affect:  Flat  Thought Process:  Disorganized  Orientation:  Full (Time, Place, and Person)  Thought Content:  Illogical and Rumination  Suicidal Thoughts:  Yes.  with intent/plan  Homicidal Thoughts:  No  Memory:  Immediate;   Good Recent;   Fair Remote;   Fair  Judgement:  Impaired  Insight:  Shallow  Psychomotor Activity:  Decreased  Concentration:  Concentration: Poor  Recall:  Poor  Fund of Knowledge:  Poor  Language:  Poor  Akathisia:  No  Handed:  Right  AIMS (if indicated):     Assets:  Housing Resilience  ADL's:  Impaired  Cognition:  Impaired,  Mild  Sleep:       Treatment Plan Summary: Daily contact with patient to assess and evaluate symptoms and progress in treatment, Medication management and Plan Patient had not apparently been drinking does not require detox.  He will continue his antidepressant and antianxiety medicine.  We will continue his previous medicine started for his diabetes including standing insulin and as needed insulin.  15-minute checks.  Monitor behavior.  Observation Level/Precautions:  15 minute checks  Laboratory:  UDS  Psychotherapy:    Medications:  Consultations:    Discharge Concerns:    Estimated LOS:  Other:     Physician Treatment Plan for Primary Diagnosis: Severe recurrent major depression without psychotic features ([place]) Long Term Goal(s): Improvement in symptoms so as ready for discharge  Short Term Goals: Ability to verbalize feelings will improve and Ability to disclose and discuss suicidal ideas  Physician Treatment Plan for Secondary Diagnosis: Principal Problem:   Severe recurrent major depression without psychotic features ([place]) Active Problems:   Hypertension   Chronic pain   Cocaine abuse ([place])   Diabetes ([place])  Long Term Goal(s): Improvement in symptoms so as ready for discharge  Short Term Goals: Compliance with prescribed medications will improve and Ability to identify triggers associated with substance abuse/mental health issues will improve  I certify that inpatient services furnished can reasonably be expected to improve the patient's condition.    Alethia Berthold, MD 5/22/20205:25 PM

## 2018-07-22 LAB — GLUCOSE, CAPILLARY
Glucose-Capillary: 272 mg/dL — ABNORMAL HIGH (ref 70–99)
Glucose-Capillary: 212 mg/dL — ABNORMAL HIGH (ref 70–99)
Glucose-Capillary: 219 mg/dL — ABNORMAL HIGH (ref 70–99)
Glucose-Capillary: 199 mg/dL — ABNORMAL HIGH (ref 70–99)
Glucose-Capillary: 137 mg/dL — ABNORMAL HIGH (ref 70–99)

## 2018-07-22 NOTE — Progress Notes (Signed)
[facility] MD Progress Note  07/22/2018 10:23 AM Daniel [name]  MRN:  [id number] Subjective: Patient chart was reviewed and he was seen in his room.  Patient reports resting well.  He is a 53 year old Caucasian male with long history of depression and cocaine abuse.  He was just in the hospital 2 days ago and came back.  States that he is not suicidal currently.  Reports he was compliant with his medications.  Denies any problems right now.  Did state that he was not feeling great the other day. Principal Problem: Severe recurrent major depression without psychotic features ([facility]) Diagnosis: Principal Problem:   Severe recurrent major depression without psychotic features ([facility]) Active Problems:   Hypertension   Chronic pain   Cocaine abuse ([facility])   Diabetes ([facility])  Total Time spent with patient: 15 minutes  Past Psychiatric History: Patient with multiple hospitalizations in the past.  He has a history of substance abuse and not following up with his treatment plans.  Past Medical History:  Past Medical History:  Diagnosis Date  . Allergy   . Anxiety   . Back pain   . Chronic pain    2/2 accident/trauma  . Hypertension   . Opioid abuse ([facility])   . Urinary retention     Past Surgical History:  Procedure Laterality Date  . BACK SURGERY    . KNEE SURGERY Left    Family History:  Family History  Problem Relation Age of Onset  . Alcohol abuse Father    Family Psychiatric  History: History of substance abuse in several family members. Social History:  Social History   Substance and Sexual Activity  Alcohol Use No     Social History   Substance and Sexual Activity  Drug Use Yes  . Types: Cocaine   Comment: Former use of cocaine and opioids    Social History   Socioeconomic History  . Marital status: Divorced    Spouse name: Not on file  . Number of children: Not on file  . Years of education: Not on file  . Highest education level: Not on file  Occupational History  . Not  on file  Social Needs  . Financial resource strain: Not on file  . Food insecurity:    Worry: Not on file    Inability: Not on file  . Transportation needs:    Medical: Not on file    Non-medical: Not on file  Tobacco Use  . Smoking status: Current Every Day Smoker    Packs/day: 1.00    Types: E-cigarettes, Cigarettes    Last attempt to quit: 03/16/2015    Years since quitting: 3.3  . Smokeless tobacco: Never Used  Substance and Sexual Activity  . Alcohol use: No  . Drug use: Yes    Types: Cocaine    Comment: Former use of cocaine and opioids  . Sexual activity: Not on file  Lifestyle  . Physical activity:    Days per week: Not on file    Minutes per session: Not on file  . Stress: Not on file  Relationships  . Social connections:    Talks on phone: Not on file    Gets together: Not on file    Attends religious service: Not on file    Active member of club or organization: Not on file    Attends meetings of clubs or organizations: Not on file    Relationship status: Not on file  Other Topics Concern  . Not  on file  Social History Narrative  . Not on file   Additional Social History:    Pain Medications: see MAR Prescriptions: see MAR Over the Counter: see MAR History of alcohol / drug use?: Yes Longest period of sobriety (when/how long): n/a Negative Consequences of Use: Financial, Personal relationships Name of Substance 1: Cocaine                  Sleep: Poor  Appetite:  Fair  Current Medications: Current Facility-Administered Medications  Medication Dose Route Frequency Provider Last Rate Last Dose  . acetaminophen (TYLENOL) tablet 650 mg  650 mg Oral Q6H PRN Clapacs, Jackquline Denmark, MD   650 mg at 07/22/18 0235  . alum & mag hydroxide-simeth (MAALOX/MYLANTA) 200-200-20 MG/5ML suspension 30 mL  30 mL Oral Q4H PRN Clapacs, John T, MD      . busPIRone (BUSPAR) tablet 15 mg  15 mg Oral TID Clapacs, Jackquline Denmark, MD   15 mg at 07/22/18 0748  . escitalopram  (LEXAPRO) tablet 10 mg  10 mg Oral Daily Clapacs, Jackquline Denmark, MD   10 mg at 07/22/18 0748  . insulin aspart (novoLOG) injection 0-15 Units  0-15 Units Subcutaneous TID WC Clapacs, Jackquline Denmark, MD   8 Units at 07/22/18 0745  . insulin aspart protamine- aspart (NOVOLOG MIX 70/30) injection 35 Units  35 Units Subcutaneous BID WC Clapacs, Jackquline Denmark, MD   35 Units at 07/21/18 1726  . linagliptin (TRADJENTA) tablet 5 mg  5 mg Oral Daily Clapacs, Jackquline Denmark, MD   5 mg at 07/22/18 0748  . magnesium hydroxide (MILK OF MAGNESIA) suspension 30 mL  30 mL Oral Daily PRN Clapacs, John T, MD      . metFORMIN (GLUCOPHAGE) tablet 1,000 mg  1,000 mg Oral BID WC Clapacs, Jackquline Denmark, MD   1,000 mg at 07/22/18 0748  . nicotine (NICODERM CQ - dosed in mg/24 hours) patch 21 mg  21 mg Transdermal Once Clapacs, Jackquline Denmark, MD   21 mg at 07/21/18 1726  . traZODone (DESYREL) tablet 100 mg  100 mg Oral QHS PRN Clapacs, Jackquline Denmark, MD   100 mg at 07/22/18 0236    Lab Results:  Results for orders placed or performed during the hospital encounter of 07/21/18 (from the past 48 hour(s))  Glucose, capillary     Status: Abnormal   Collection Time: 07/21/18  4:41 PM  Result Value Ref Range   Glucose-Capillary 367 (H) 70 - 99 mg/dL  Glucose, capillary     Status: Abnormal   Collection Time: 07/21/18  9:34 PM  Result Value Ref Range   Glucose-Capillary 214 (H) 70 - 99 mg/dL   Comment 1 Notify RN   Glucose, capillary     Status: Abnormal   Collection Time: 07/22/18  7:00 AM  Result Value Ref Range   Glucose-Capillary 272 (H) 70 - 99 mg/dL   Comment 1 Notify RN     Blood Alcohol level:  Lab Results  Component Value Date   ETH <10 07/21/2018   ETH <10 07/14/2018    Metabolic Disorder Labs: Lab Results  Component Value Date   HGBA1C 10.4 (H) 07/14/2018   MPG 251.78 07/14/2018   No results found for: PROLACTIN Lab Results  Component Value Date   CHOL 140 02/24/2015   TRIG 89 02/24/2015   HDL 32 (L) 02/24/2015   CHOLHDL 4.4 02/24/2015    VLDL 18 02/24/2015   LDLCALC 90 02/24/2015   LDLCALC 44 11/29/2011  Physical Findings: AIMS: Facial and Oral Movements Muscles of Facial Expression: None, normal Lips and Perioral Area: None, normal Jaw: None, normal Tongue: None, normal,Extremity Movements Upper (arms, wrists, hands, fingers): None, normal Lower (legs, knees, ankles, toes): None, normal, Trunk Movements Neck, shoulders, hips: None, normal, Overall Severity Severity of abnormal movements (highest score from questions above): None, normal Incapacitation due to abnormal movements: None, normal Patient's awareness of abnormal movements (rate only patient's report): No Awareness, Dental Status Current problems with teeth and/or dentures?: No Does patient usually wear dentures?: No  CIWA:  CIWA-Ar Total: 0 COWS:  COWS Total Score: 2  Musculoskeletal: Strength & Muscle Tone: within normal limits Gait & Station: normal Patient leans: N/A  Psychiatric Specialty Exam: Physical Exam  ROS  Blood pressure 120/79, pulse 89, temperature 98.1 F (36.7 C), temperature source Oral, resp. rate 18, height 6' (1.829 m), weight 114.8 kg, SpO2 97 %.Body mass index is 34.31 kg/m.  General Appearance: Disheveled  Eye Contact:  Fair  Speech:  Slow  Volume:  Decreased  Mood:  Anxious and Dysphoric  Affect:  Depressed and Flat  Thought Process:  Coherent  Orientation:  Full (Time, Place, and Person)  Thought Content:  WDL  Suicidal Thoughts:  Yes.  without intent/plan  Homicidal Thoughts:  No  Memory:  Immediate;   Fair Recent;   Fair Remote;   Fair  Judgement:  Impaired  Insight:  Shallow  Psychomotor Activity:  Decreased  Concentration:  Concentration: Fair and Attention Span: Fair  Recall:  [employer]  Fund of Knowledge:  Fair  Language:  Fair  Akathisia:  No  Handed:  Right  AIMS (if indicated):     Assets:  Communication Skills  ADL's:  Impaired  Cognition:  WNL  Sleep:  Number of Hours: 6.15     Treatment Plan  Summary: Daily contact with patient to assess and evaluate symptoms and progress in treatment and Medication management  Daily contact with patient to assess and evaluate symptoms and progress in treatment, Medication management and Plan Patient had not apparently been drinking does not require detox.  He will continue his antidepressant and antianxiety medicine.  We will continue his previous medicine started for his diabetes including standing insulin and as needed insulin.  15-minute checks.  Monitor behavior. CBG this morning- 277 Patient denies any symptoms or problems with his high blood sugar today.  Patrick NorthHimabindu Anikah Hogge, MD 07/22/2018, 10:23 AM

## 2018-07-22 NOTE — Plan of Care (Signed)
Patient denies suicidal ideation today. Acknowledges he becomes depressed and desperate. Discussed injury that causes chronic back pain and was given Tylenol with satisfactory effect. Discussion of strategies for preventing recurrence of anxiety/depression. Verbalized willingness to try options and participate in group interactions to improve quality of life. Problem: Education: Goal: Knowledge of [facility] Education information/materials will improve 07/22/2018 0041 by Nicole Kindred, RN Outcome: Progressing 07/22/2018 0034 by Nicole Kindred, RN Outcome: Progressing Goal: Emotional status will improve 07/22/2018 0041 by Nicole Kindred, RN Outcome: Progressing 07/22/2018 0034 by Nicole Kindred, RN Outcome: Progressing Goal: Mental status will improve 07/22/2018 0041 by Nicole Kindred, RN Outcome: Progressing 07/22/2018 0034 by Nicole Kindred, RN Outcome: Progressing Goal: Verbalization of understanding the information provided will improve 07/22/2018 0041 by Nicole Kindred, RN Outcome: Progressing 07/22/2018 0034 by Nicole Kindred, RN Outcome: Progressing   Problem: Health Behavior/Discharge Planning: Goal: Identification of resources available to assist in meeting health care needs will improve 07/22/2018 0041 by Nicole Kindred, RN Outcome: Progressing 07/22/2018 0034 by Nicole Kindred, RN Outcome: Progressing Goal: Compliance with treatment plan for underlying cause of condition will improve 07/22/2018 0041 by Nicole Kindred, RN Outcome: Progressing 07/22/2018 0034 by Nicole Kindred, RN Outcome: Progressing   Problem: Safety: Goal: Periods of time without injury will increase Outcome: Progressing   Problem: Coping: Goal: Coping ability will improve Outcome: Progressing Goal: Will verbalize feelings Outcome: Progressing   Problem: Role Relationship: Goal: Will demonstrate positive changes in social behaviors and  relationships Outcome: Progressing   Problem: Health Behavior/Discharge Planning: Goal: Identification of resources available to assist in meeting health care needs will improve Outcome: Progressing   Problem: Self-Concept: Goal: Ability to disclose and discuss suicidal ideas will improve Outcome: Progressing Goal: Will verbalize positive feelings about self Outcome: Progressing

## 2018-07-22 NOTE — Progress Notes (Signed)
D- Patient alert and oriented.Both affect and mood have improved over the shift. . Denies SI, HI, AVH, and but has chronic persistent  Pain for which he was medicated with satisfactory results.. Goal. Currently his goal is to adjust to the strategy for getting better.Marland Kitchen A- Scheduled medications administered to patient, per MD orders. Support and encouragement provided.  Routine safety checks conducted every 15 minutes.  Patient informed to notify staff with problems or concerns. R- No adverse drug reactions noted. Patient contracts for safety at this time. Patient compliant with medications and treatment plan. Patient receptive, calm, and cooperative. Patient interacts  with others on the unit.  Patient remains safe at this time.

## 2018-07-22 NOTE — Plan of Care (Signed)
D: Patient denies SI/HI/AVH. Patient verbally contracts for safety. Patient is calm, cooperative and pleasant. Patient is seen in milieu interacting with peers. Patient has no complaints at this time. Patient has completed self inventory sheet for today, goal is "going home."  A: Patient was assessed by this nurse. Patient was oriented to unit. Patient's safety was maintained on unit. Q x 15 minute observation checks were completed for safety. Patient care plan was reviewed. Patient was offered support and encouragement. Patient was encourage to attend groups, participate in unit activities and continue with plan of care.    R: Patient has no complaints of pain at this time. Patient is receptive to treatment and safety maintained on unit.     Problem: Education: Goal: Knowledge of [facility] Education information/materials will improve Outcome: Not Progressing Goal: Emotional status will improve Outcome: Not Progressing Goal: Mental status will improve Outcome: Not Progressing
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
## 2018-07-22 NOTE — [facility] Group Notes (Signed)
LCSW Group Therapy Note   07/22/2018 1:15pm   Type of Therapy and Topic:  Group Therapy:  Trust and Honesty  Participation Level:  Active  Description of Group:    In this group patients will be asked to explore the value of being honest.  Patients will be guided to discuss their thoughts, feelings, and behaviors related to honesty and trusting in others. Patients will process together how trust and honesty relate to forming relationships with peers, family members, and self. Each patient will be challenged to identify and express feelings of being vulnerable. Patients will discuss reasons why people are dishonest and identify alternative outcomes if one was truthful (to self or others). This group will be process-oriented, with patients participating in exploration of their own experiences, giving and receiving support, and processing challenge from other group members.   Therapeutic Goals: 1. Patient will identify why honesty is important to relationships and how honesty overall affects relationships.  2. Patient will identify a situation where they lied or were lied too and the  feelings, thought process, and behaviors surrounding the situation 3. Patient will identify the meaning of being vulnerable, how that feels, and how that correlates to being honest with self and others. 4. Patient will identify situations where they could have told the truth, but instead lied and explain reasons of dishonesty.   Summary of Patient Progress Pt was invited to attend group but chose not to attend. CSW will continue to encourage pt to attend group throughout their admission.     Therapeutic Modalities:   Cognitive Behavioral Therapy Solution Focused Therapy Motivational Interviewing Brief Therapy  Amayia Ciano  CUEBAS-COLON, LCSW 07/22/2018 12:36 PM

## 2018-07-23 LAB — GLUCOSE, CAPILLARY
Glucose-Capillary: 294 mg/dL — ABNORMAL HIGH (ref 70–99)
Glucose-Capillary: 260 mg/dL — ABNORMAL HIGH (ref 70–99)
Glucose-Capillary: 111 mg/dL — ABNORMAL HIGH (ref 70–99)
Glucose-Capillary: 163 mg/dL — ABNORMAL HIGH (ref 70–99)
Glucose-Capillary: 167 mg/dL — ABNORMAL HIGH (ref 70–99)

## 2018-07-23 NOTE — Progress Notes (Signed)
Patient was visible in the milieu until bedtime. Alert and oriented x 4. Pleasant and cooperative. Denying thoughts of self harm. Stayed in the dayroom with peers. Received medications and had a snack. No major concern. Support and encouragements provided. Currently in bed asleep and safety maintained.

## 2018-07-23 NOTE — Plan of Care (Signed)
Pleasant and cooperative. Compliant with treatment 

## 2018-07-23 NOTE — Plan of Care (Signed)
D- Patient alert and oriented. Patient presents in a sad/depressed mood on assessment stating that he "kept waking up" last night and had complaints of back pain, in which he rated his pain an "8.5/10". Patient did request pain medication from this writer. Patient continues to endorse passive SI without a plan, but he contracts for safety with this Clinical research associate. Patient rated his depression/anxiety an "8/10" reporting that "life in general" is making him feel this way. Patient denies HI/AVH, at this time. Patient's goal for today is to "work on trying to go home".  A- Scheduled medications administered to patient, per MD orders. Support and encouragement provided.  Routine safety checks conducted every 15 minutes.  Patient informed to notify staff with problems or concerns.  R- No adverse drug reactions noted. Patient contracts for safety at this time. Patient compliant with medications and treatment plan. Patient receptive, calm, and cooperative. Patient interacts well with others on the unit.  Patient remains safe at this time.  Problem: Education: Goal: Knowledge of [place] [facility] Education information/materials will improve Outcome: Progressing Goal: Emotional status will improve Outcome: Progressing Goal: Mental status will improve Outcome: Progressing Goal: Verbalization of understanding the information provided will improve Outcome: Progressing   Problem: Health Behavior/Discharge Planning: Goal: Identification of resources available to assist in meeting health care needs will improve Outcome: Progressing Goal: Compliance with treatment plan for underlying cause of condition will improve Outcome: Progressing   Problem: Safety: Goal: Periods of time without injury will increase Outcome: Progressing   Problem: Coping: Goal: Coping ability will improve Outcome: Progressing Goal: Will verbalize feelings Outcome: Progressing   Problem: Role Relationship: Goal: Will demonstrate  positive changes in social behaviors and relationships Outcome: Progressing   Problem: Health Behavior/Discharge Planning: Goal: Identification of resources available to assist in meeting health care needs will improve Outcome: Progressing   Problem: Self-Concept: Goal: Ability to disclose and discuss suicidal ideas will improve Outcome: Progressing Goal: Will verbalize positive feelings about self Outcome: Progressing

## 2018-07-23 NOTE — Progress Notes (Signed)
Patient just came to this writer and expressed that he has an upset stomach and was not able to eat his dinner. Patient's CBG was 163 before dinner. This Clinical research associate notified MD and was instructed to just administer the sliding scale and hold the 70/30 dose. Patient states that he will hold off on his oral medication to see how he feels later. This Clinical research associate will revisit administering PO medication at 1800.
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
## 2018-07-23 NOTE — [facility] Group Notes (Signed)
LCSW Group Therapy Note 07/23/2018 1:15pm  Type of Therapy and Topic: Group Therapy: Feelings Around Returning Home & Establishing a Supportive Framework and Supporting Oneself When Supports Not Available  Participation Level: Did Not Attend  Description of Group:  Patients first processed thoughts and feelings about upcoming discharge. These included fears of upcoming changes, lack of change, new living environments, judgements and expectations from others and overall stigma of mental health issues. The group then discussed the definition of a supportive framework, what that looks and feels like, and how do to discern it from an unhealthy non-supportive network. The group identified different types of supports as well as what to do when your family/friends are less than helpful or unavailable  Therapeutic Goals  1. Patient will identify one healthy supportive network that they can use at discharge. 2. Patient will identify one factor of a supportive framework and how to tell it from an unhealthy network. 3. Patient able to identify one coping skill to use when they do not have positive supports from others. 4. Patient will demonstrate ability to communicate their needs through discussion and/or role plays.  Summary of Patient Progress:  Pt was invited to attend group but chose not to attend. CSW will continue to encourage pt to attend group throughout their admission.    Therapeutic Modalities Cognitive Behavioral Therapy Motivational Interviewing   Daniel [name]  CUEBAS-COLON, LCSW 07/23/2018 11:59 AM

## 2018-07-23 NOTE — [facility] Suicide Risk Assessment (Signed)
[facility] INPATIENT:  Family/Significant Other Suicide Prevention Education  Suicide Prevention Education:  Patient Refusal for Family/Significant Other Suicide Prevention Education: The patient Daniel [name] has refused to provide written consent for family/significant other to be provided Family/Significant Other Suicide Prevention Education during admission and/or prior to discharge.  Physician notified.  Jacoria Keiffer  CUEBAS-COLON 07/23/2018, 12:52 PM

## 2018-07-24 LAB — GLUCOSE, CAPILLARY: Glucose-Capillary: 148 mg/dL — ABNORMAL HIGH (ref 70–99)

## 2018-07-24 MED ORDER — INSULIN ASPART PROT & ASPART (70-30 MIX) 100 UNIT/ML ~~LOC~~ SUSP: 35.0000 [IU] | Freq: Two times a day (BID) | SUBCUTANEOUS | 1 refills | Status: AC

## 2018-07-24 MED ORDER — NICOTINE 21 MG/24HR TD PT24
21.0000 mg | MEDICATED_PATCH | Freq: Every day | TRANSDERMAL | Status: DC
  Administered 2018-07-24: 21 mg via TRANSDERMAL
  Filled 2018-07-24: qty 1

## 2018-07-24 MED ORDER — INSULIN ASPART 100 UNIT/ML ~~LOC~~ SOLN: 0.0000 [IU] | Freq: Three times a day (TID) | SUBCUTANEOUS | 11 refills | Status: AC

## 2018-07-24 MED ORDER — ESCITALOPRAM OXALATE 10 MG PO TABS: 10.0000 mg | ORAL_TABLET | Freq: Every day | ORAL | 1 refills | Status: AC

## 2018-07-24 MED ORDER — METFORMIN HCL 1000 MG PO TABS: 1000.0000 mg | ORAL_TABLET | Freq: Two times a day (BID) | ORAL | 1 refills | Status: AC

## 2018-07-24 MED ORDER — LINAGLIPTIN 5 MG PO TABS: 5.0000 mg | ORAL_TABLET | Freq: Every day | ORAL | 1 refills | Status: AC

## 2018-07-24 MED ORDER — TRAZODONE HCL 100 MG PO TABS: 100.0000 mg | ORAL_TABLET | Freq: Every evening | ORAL | 1 refills | Status: AC | PRN

## 2018-07-24 NOTE — Progress Notes (Signed)
Patient ID: Daniel [name], male   DOB: [birth year], 53 y.o.   MRN: [id number]  Discharge Note:  Patient denies SI/HI/AVH at this time. Discharge instructions, AVS, prescriptions, and transition record gone over with patient. Patient agrees to comply with medication management, follow-up visit, and outpatient therapy. Patient belongings returned to patient. Patient questions and concerns addressed and answered. Patient ambulatory off unit. Patient discharged to home with his brother.
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
## 2018-07-24 NOTE — Progress Notes (Signed)
  [facility] Adult Case Management Discharge Plan :  Will you be returning to the same living situation after discharge:  Yes,  home At discharge, do you have transportation home?: Yes,  family Do you have the ability to pay for your medications: Yes,  insurance  Release of information consent forms completed and in the chart;    Patient to Follow up at: Follow-up Information    [facility] Follow up.   Why:  Walk in hours are Monday, Wednesday, and Fridays from 8:00AM-4:00PM. Please walk in and bring a photo id, insurance information, and list of current meds.Thank You! Contact information: [address] [place] [address] [phone]        [facility] Follow up.   Why:  You have a medication management appointment scheduled for Wednesday 07/26/2018 at 11:00AM with Dr. Ephraim Hamburger. Thank You! Contact information: [address] [place] [address] [phone]           Next level of care provider has access to [facility] Link:no  Safety Planning and Suicide Prevention discussed: Yes,  SPE compelted with pt  Have you used any form of tobacco in the last 30 days? (Cigarettes, Smokeless Tobacco, Cigars, and/or Pipes): Yes  Has patient been referred to the Quitline?: Patient refused referral  Patient has been referred for addiction treatment: N/A  Mechele Dawley, LCSW 07/24/2018, 10:55 AM

## 2018-07-24 NOTE — [facility] Suicide Risk Assessment (Signed)
[facility] Discharge Suicide Risk Assessment   Principal Problem: Severe recurrent major depression without psychotic features [facility]) Discharge Diagnoses: Principal Problem:   Severe recurrent major depression without psychotic features ([facility]) Active Problems:   Hypertension   Chronic pain   Cocaine abuse ([facility])   Diabetes ([facility])   Total Time spent with patient: 45 minutes  Musculoskeletal: Strength & Muscle Tone: within normal limits Gait & Station: normal Patient leans: N/A  Psychiatric Specialty Exam: Review of Systems  Constitutional: Negative.   HENT: Negative.   Eyes: Negative.   Respiratory: Negative.   Cardiovascular: Negative.   Gastrointestinal: Negative.   Musculoskeletal: Negative.   Skin: Negative.   Neurological: Negative.   Psychiatric/Behavioral: Negative.     Blood pressure 125/78, pulse 92, temperature 98.2 F (36.8 C), temperature source Oral, resp. rate 18, height 6' (1.829 m), weight 114.8 kg, SpO2 98 %.Body mass index is 34.31 kg/m.  General Appearance: Casual  Eye Contact::  Good  Speech:  Slow409  Volume:  Decreased  Mood:  Euthymic  Affect:  Congruent  Thought Process:  Goal Directed  Orientation:  Full (Time, Place, and Person)  Thought Content:  Logical  Suicidal Thoughts:  No  Homicidal Thoughts:  No  Memory:  Immediate;   Fair Recent;   Fair Remote;   Fair  Judgement:  Fair  Insight:  Shallow  Psychomotor Activity:  Normal  Concentration:  Fair  Recall:  [employer] of Knowledge:Fair  Language: Fair  Akathisia:  No  Handed:  Right  AIMS (if indicated):     Assets:  Desire for Improvement Housing  Sleep:  Number of Hours: 4.5  Cognition: WNL  ADL's:  Intact   Mental Status Per Nursing Assessment::   On Admission:  Suicidal ideation indicated by patient  Demographic Factors:  Male, Divorced or widowed, Caucasian, Low socioeconomic status and Unemployed  Loss Factors: Financial problems/change in socioeconomic status  Historical  Factors: Prior suicide attempts and Impulsivity  Risk Reduction Factors:   Positive therapeutic relationship  Continued Clinical Symptoms:  Depression:   Comorbid alcohol abuse/dependence Impulsivity Alcohol/Substance Abuse/Dependencies  Cognitive Features That Contribute To Risk:  None    Suicide Risk:  Minimal: No identifiable suicidal ideation.  Patients presenting with no risk factors but with morbid ruminations; may be classified as minimal risk based on the severity of the depressive symptoms    Plan Of Care/Follow-up recommendations:  Activity:  Activity as tolerated Diet:  Diabetic diet Other:  Patient is denying any suicidal ideation now and is calm and showing appropriate behavior.  If he is to relapse into substance abuse as is often the case he continues to be at high risk for suicidal threats or possibly acting out but at this point there is nothing we are going to be able to do in the hospital to make a change to this long-term risk except to continue to reinforce his need to take seriously substance abuse treatment.  Mordecai Rasmussen, MD 07/24/2018, 9:44 AM

## 2018-07-24 NOTE — Progress Notes (Signed)
Recreation Therapy Notes   Date: 07/24/2018  Time: 9:30 am   Location: Craft room   Behavioral response: N/A   Intervention Topic: Coping skills  Discussion/Intervention: Patient did not attend group.   Clinical Observations/Feedback:  Patient did not attend group.   Niccolas Loeper LRT/CTRS        Pietrina Jagodzinski 07/24/2018 10:56 AM

## 2018-07-24 NOTE — Plan of Care (Signed)
Patient has been in bed most of the shift, calm and maintaining safety, voice no physical concerns, expressed depressions, patient have knowledge of treatment plan and compliant with his medicines, denies any suicidal , homicidal ideations , denies delusions and hallucinations and maintaining safety guide lines, support and education is provided no distress, 15 minutes rounding is in progress.   Problem: Education: Goal: Knowledge of [facility] Education information/materials will improve Outcome: Progressing Goal: Emotional status will improve Outcome: Progressing Goal: Mental status will improve Outcome: Progressing Goal: Verbalization of understanding the information provided will improve Outcome: Progressing   Problem: Health Behavior/Discharge Planning: Goal: Identification of resources available to assist in meeting health care needs will improve Outcome: Progressing Goal: Compliance with treatment plan for underlying cause of condition will improve Outcome: Progressing   Problem: Safety: Goal: Periods of time without injury will increase Outcome: Progressing   Problem: Coping: Goal: Coping ability will improve Outcome: Progressing Goal: Will verbalize feelings Outcome: Progressing   Problem: Role Relationship: Goal: Will demonstrate positive changes in social behaviors and relationships Outcome: Progressing   Problem: Health Behavior/Discharge Planning: Goal: Identification of resources available to assist in meeting health care needs will improve Outcome: Progressing   Problem: Self-Concept: Goal: Ability to disclose and discuss suicidal ideas will improve Outcome: Progressing Goal: Will verbalize positive feelings about self Outcome: Progressing

## 2018-07-24 NOTE — Tx Team (Signed)
Interdisciplinary Treatment and Diagnostic Plan Update  07/24/2018 Time of Session: 9:00AM Daniel [name] MRN: [id number]  Principal Diagnosis: Severe recurrent major depression without psychotic features [facility])  Secondary Diagnoses: Principal Problem:   Severe recurrent major depression without psychotic features ([place]) Active Problems:   Hypertension   Chronic pain   Cocaine abuse ([place])   Diabetes ([place])   Current Medications:  Current Facility-Administered Medications  Medication Dose Route Frequency Provider Last Rate Last Dose  . acetaminophen (TYLENOL) tablet 650 mg  650 mg Oral Q6H PRN Clapacs, Madie Reno, MD   650 mg at 07/24/18 0755  . alum & mag hydroxide-simeth (MAALOX/MYLANTA) 200-200-20 MG/5ML suspension 30 mL  30 mL Oral Q4H PRN Clapacs, John T, MD      . busPIRone (BUSPAR) tablet 15 mg  15 mg Oral TID Clapacs, Madie Reno, MD   15 mg at 07/24/18 0755  . escitalopram (LEXAPRO) tablet 10 mg  10 mg Oral Daily Clapacs, Madie Reno, MD   10 mg at 07/24/18 0755  . insulin aspart (novoLOG) injection 0-15 Units  0-15 Units Subcutaneous TID WC Clapacs, Madie Reno, MD   2 Units at 07/24/18 0756  . insulin aspart protamine- aspart (NOVOLOG MIX 70/30) injection 35 Units  35 Units Subcutaneous BID WC Clapacs, Madie Reno, MD   35 Units at 07/24/18 0757  . linagliptin (TRADJENTA) tablet 5 mg  5 mg Oral Daily Clapacs, Madie Reno, MD   5 mg at 07/24/18 0755  . magnesium hydroxide (MILK OF MAGNESIA) suspension 30 mL  30 mL Oral Daily PRN Clapacs, John T, MD      . metFORMIN (GLUCOPHAGE) tablet 1,000 mg  1,000 mg Oral BID WC Clapacs, Madie Reno, MD   1,000 mg at 07/24/18 0755  . nicotine (NICODERM CQ - dosed in mg/24 hours) patch 21 mg  21 mg Transdermal Daily Clapacs, Madie Reno, MD   21 mg at 07/24/18 6979  . traZODone (DESYREL) tablet 100 mg  100 mg Oral QHS PRN Clapacs, Madie Reno, MD   100 mg at 07/22/18 2131   PTA Medications: Medications Prior to Admission  Medication Sig Dispense Refill Last Dose  . buprenorphine  (SUBUTEX) 8 MG SUBL SL tablet Place 8 mg under the tongue 2 (two) times a day.   Not Taking at Unknown time  . [DISCONTINUED] escitalopram (LEXAPRO) 10 MG tablet Take 1 tablet (10 mg total) by mouth daily. 30 tablet 1 Past Week at Unknown time  . blood glucose meter kit and supplies KIT Dispense based on patient and insurance preference. Use up to four times daily as directed. (FOR ICD-9 250.00, 250.01). 1 each 0   . busPIRone (BUSPAR) 15 MG tablet Take 1 tablet (15 mg total) by mouth 3 (three) times daily. (Patient not taking: Reported on 07/21/2018) 90 tablet 1 Not Taking at Unknown time  . insulin aspart (NOVOLOG) 100 UNIT/ML injection Inject 0-20 Units into the skin 3 (three) times daily with meals. Check blood sugars prior to each meal.  Use sliding scale for this additional insulin. (Patient not taking: Reported on 07/21/2018) 10 mL 1 Not Taking at Unknown time  . insulin aspart protamine- aspart (NOVOLOG MIX 70/30) (70-30) 100 UNIT/ML injection Inject 0.35 mLs (35 Units total) into the skin 2 (two) times daily with a meal. (Patient not taking: Reported on 07/21/2018) 10 mL 2 Not Taking at Unknown time  . [DISCONTINUED] linagliptin (TRADJENTA) 5 MG TABS tablet Take 1 tablet (5 mg total) by mouth daily. (Patient not taking: Reported  on 07/21/2018) 30 tablet 1 Not Taking at Unknown time  . [DISCONTINUED] metFORMIN (GLUCOPHAGE) 1000 MG tablet Take 1 tablet (1,000 mg total) by mouth 2 (two) times daily with a meal. (Patient not taking: Reported on 07/21/2018) 60 tablet 1 Not Taking at Unknown time  . [DISCONTINUED] traZODone (DESYREL) 100 MG tablet Take 1 tablet (100 mg total) by mouth at bedtime as needed for sleep. (Patient not taking: Reported on 07/22/2018) 30 tablet 1 Not Taking    Patient Stressors: Marital or family conflict Substance abuse  Patient Strengths: General fund of knowledge Motivation for treatment/growth  Treatment Modalities: Medication Management, Group therapy, Case management,   1 to 1 session with clinician, Psychoeducation, Recreational therapy.   Physician Treatment Plan for Primary Diagnosis: Severe recurrent major depression without psychotic features ([place]) Long Term Goal(s): Improvement in symptoms so as ready for discharge Improvement in symptoms so as ready for discharge   Short Term Goals: Ability to verbalize feelings will improve Ability to disclose and discuss suicidal ideas Compliance with prescribed medications will improve Ability to identify triggers associated with substance abuse/mental health issues will improve  Medication Management: Evaluate patient's response, side effects, and tolerance of medication regimen.  Therapeutic Interventions: 1 to 1 sessions, Unit Group sessions and Medication administration.  Evaluation of Outcomes: Progressing  Physician Treatment Plan for Secondary Diagnosis: Principal Problem:   Severe recurrent major depression without psychotic features ([place]) Active Problems:   Hypertension   Chronic pain   Cocaine abuse ([place])   Diabetes ([place])  Long Term Goal(s): Improvement in symptoms so as ready for discharge Improvement in symptoms so as ready for discharge   Short Term Goals: Ability to verbalize feelings will improve Ability to disclose and discuss suicidal ideas Compliance with prescribed medications will improve Ability to identify triggers associated with substance abuse/mental health issues will improve     Medication Management: Evaluate patient's response, side effects, and tolerance of medication regimen.  Therapeutic Interventions: 1 to 1 sessions, Unit Group sessions and Medication administration.  Evaluation of Outcomes: Progressing   RN Treatment Plan for Primary Diagnosis: Severe recurrent major depression without psychotic features ([place]) Long Term Goal(s): Knowledge of disease and therapeutic regimen to maintain health will improve  Short Term Goals: Ability to verbalize frustration and  anger appropriately will improve, Ability to demonstrate self-control, Ability to participate in decision making will improve, Ability to verbalize feelings will improve and Ability to disclose and discuss suicidal ideas  Medication Management: RN will administer medications as ordered by provider, will assess and evaluate patient's response and provide education to patient for prescribed medication. RN will report any adverse and/or side effects to prescribing provider.  Therapeutic Interventions: 1 on 1 counseling sessions, Psychoeducation, Medication administration, Evaluate responses to treatment, Monitor vital signs and CBGs as ordered, Perform/monitor CIWA, COWS, AIMS and Fall Risk screenings as ordered, Perform wound care treatments as ordered.  Evaluation of Outcomes: Progressing   LCSW Treatment Plan for Primary Diagnosis: Severe recurrent major depression without psychotic features ([place]) Long Term Goal(s): Safe transition to appropriate next level of care at discharge, Engage patient in therapeutic group addressing interpersonal concerns.  Short Term Goals: Engage patient in aftercare planning with referrals and resources, Increase social support, Increase ability to appropriately verbalize feelings and Increase emotional regulation  Therapeutic Interventions: Assess for all discharge needs, 1 to 1 time with Social worker, Explore available resources and support systems, Assess for adequacy in community support network, Educate family and significant other(s) on suicide prevention, Complete Psychosocial  Assessment, Interpersonal group therapy.  Evaluation of Outcomes: Progressing   Progress in Treatment: Attending groups: No. Participating in groups: No. Taking medication as prescribed: Yes. Toleration medication: Yes. Family/Significant other contact made: No, will contact:  pt declined. Patient understands diagnosis: Yes. Discussing patient identified problems/goals with staff:  Yes. Medical problems stabilized or resolved: Yes. Denies suicidal/homicidal ideation: Yes. Issues/concerns per patient self-inventory: No. Other: none  New problem(s) identified: No, Describe:  none  New Short Term/Long Term Goal(s): detox, elimination of symptoms of psychosis, medication management for mood stabilization; elimination of SI thoughts; development of comprehensive mental wellness/sobriety plan.  Patient Goals:  "be more aware of my ambitions when I get out".  Discharge Plan or Barriers: Patient reports plans to return home and begin outpatient therapy.  Reason for Continuation of Hospitalization: Anxiety Depression Medication stabilization  Estimated Length of Stay:  1-5 days  Attendees: Patient: Daniel [name] 07/24/2018 10:40 AM  Physician:  Dr. Weber Cooks, MD 07/24/2018 10:40 AM  Nursing: Trenda Moots, RN 07/24/2018 10:40 AM  RN Care Manager: 07/24/2018 10:40 AM  Social Worker: Assunta Curtis, LCSW 07/24/2018 10:40 AM  Recreational Therapist: Roanna Epley, Reather Converse, LRT 07/24/2018 10:40 AM  Other: Evalina Field, LCSW 07/24/2018 10:40 AM  Other:  07/24/2018 10:40 AM  Other: 07/24/2018 10:40 AM     Scribe for Treatment Team: Rozann Lesches, LCSW 07/24/2018 10:49 AM

## 2018-09-04 ENCOUNTER — Other Ambulatory Visit: Payer: Self-pay | Admitting: Psychiatry

## 2018-09-06 ENCOUNTER — Ambulatory Visit: Payer: Medicaid Other | Admitting: Nurse Practitioner

## 2018-09-21 ENCOUNTER — Ambulatory Visit: Payer: Medicaid Other | Admitting: Family Medicine

## 2018-09-21 ENCOUNTER — Other Ambulatory Visit: Payer: Self-pay | Admitting: Psychiatry

## 2018-10-02 ENCOUNTER — Other Ambulatory Visit: Payer: Self-pay | Admitting: Psychiatry

## 2019-03-06 ENCOUNTER — Other Ambulatory Visit: Payer: Self-pay | Admitting: Physician Assistant

## 2019-03-06 DIAGNOSIS — R413 Other amnesia: Secondary | ICD-10-CM

## 2019-04-18 ENCOUNTER — Ambulatory Visit
Admission: RE | Admit: 2019-04-18 | Discharge: 2019-04-18 | Disposition: A | Discharge status: Home / Self Care | Payer: Medicaid Other | Source: Ambulatory Visit | Attending: Physician Assistant | Admitting: Physician Assistant

## 2019-04-18 ENCOUNTER — Other Ambulatory Visit: Payer: Self-pay

## 2019-04-18 DIAGNOSIS — R413 Other amnesia: Secondary | ICD-10-CM | POA: Diagnosis present

## 2019-04-18 LAB — POCT I-STAT CREATININE: Creatinine, Ser: 0.8 mg/dL (ref 0.61–1.24)

## 2019-04-18 MED ORDER — GADOBUTROL 1 MMOL/ML IV SOLN
10.0000 mL | Freq: Once | INTRAVENOUS | Status: AC | PRN
  Administered 2019-04-18: 14:00:00 10 mL via INTRAVENOUS

## 2019-05-21 ENCOUNTER — Emergency Department
Admission: EM | Admit: 2019-05-21 | Discharge: 2019-05-21 | Disposition: A | Discharge status: Home / Self Care | Payer: Medicaid Other | Attending: Emergency Medicine | Admitting: Emergency Medicine

## 2019-05-21 ENCOUNTER — Other Ambulatory Visit: Payer: Self-pay

## 2019-05-21 DIAGNOSIS — F17210 Nicotine dependence, cigarettes, uncomplicated: Secondary | ICD-10-CM | POA: Insufficient documentation

## 2019-05-21 DIAGNOSIS — R519 Headache, unspecified: Secondary | ICD-10-CM | POA: Diagnosis present

## 2019-05-21 DIAGNOSIS — I10 Essential (primary) hypertension: Secondary | ICD-10-CM | POA: Diagnosis not present

## 2019-05-21 DIAGNOSIS — E119 Type 2 diabetes mellitus without complications: Secondary | ICD-10-CM | POA: Insufficient documentation

## 2019-05-21 DIAGNOSIS — Z794 Long term (current) use of insulin: Secondary | ICD-10-CM | POA: Insufficient documentation

## 2019-05-21 DIAGNOSIS — Z79899 Other long term (current) drug therapy: Secondary | ICD-10-CM | POA: Diagnosis not present

## 2019-05-21 DIAGNOSIS — G43909 Migraine, unspecified, not intractable, without status migrainosus: Secondary | ICD-10-CM | POA: Diagnosis not present

## 2019-05-21 DIAGNOSIS — F17290 Nicotine dependence, other tobacco product, uncomplicated: Secondary | ICD-10-CM | POA: Diagnosis not present

## 2019-05-21 MED ORDER — DIPHENHYDRAMINE HCL 25 MG PO CAPS
25.0000 mg | ORAL_CAPSULE | Freq: Once | ORAL | Status: AC
  Administered 2019-05-21: 08:00:00 25 mg via ORAL
  Filled 2019-05-21: qty 1

## 2019-05-21 MED ORDER — NAPROXEN 500 MG PO TABS: 500.0000 mg | ORAL_TABLET | Freq: Two times a day (BID) | ORAL | 2 refills | Status: AC

## 2019-05-21 MED ORDER — ONDANSETRON 4 MG PO TBDP
4.0000 mg | ORAL_TABLET | Freq: Once | ORAL | Status: AC
  Administered 2019-05-21: 4 mg via ORAL
  Filled 2019-05-21: qty 1

## 2019-05-21 MED ORDER — KETOROLAC TROMETHAMINE 30 MG/ML IJ SOLN
30.0000 mg | Freq: Once | INTRAMUSCULAR | Status: AC
  Administered 2019-05-21: 30 mg via INTRAMUSCULAR
  Filled 2019-05-21: qty 1

## 2019-05-21 NOTE — ED Triage Notes (Signed)
Pt states bilateral temple headache for three days. Pt states has had some nausea. Denies known fever, runny nose, cough. Pt drinking soda in triage and no acute distress noted. Pt states has been taking tylenol, advil and aleve with no improvement.
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
## 2019-05-21 NOTE — ED Notes (Signed)
Pt verbalized understanding of discharge instructions. NAD at this time. 

## 2020-09-29 DEATH — deceased
